# Patient Record
Sex: Male | Born: 1960
Health system: Southern US, Community
[De-identification: ages and names within clinical notes are randomized; demographics above are authoritative.]

## PROBLEM LIST (undated history)

## (undated) ENCOUNTER — Emergency Department (HOSPITAL_BASED_OUTPATIENT_CLINIC_OR_DEPARTMENT_OTHER): Admission: EM | Payer: 59 | Source: Home / Self Care

## (undated) ENCOUNTER — Emergency Department (HOSPITAL_COMMUNITY): Admission: EM

## (undated) DIAGNOSIS — Z9289 Personal history of other medical treatment: Secondary | ICD-10-CM

## (undated) DIAGNOSIS — I219 Acute myocardial infarction, unspecified: Secondary | ICD-10-CM

## (undated) DIAGNOSIS — E119 Type 2 diabetes mellitus without complications: Secondary | ICD-10-CM

## (undated) DIAGNOSIS — E785 Hyperlipidemia, unspecified: Secondary | ICD-10-CM

## (undated) DIAGNOSIS — I10 Essential (primary) hypertension: Secondary | ICD-10-CM

## (undated) DIAGNOSIS — M1990 Unspecified osteoarthritis, unspecified site: Secondary | ICD-10-CM

## (undated) DIAGNOSIS — H269 Unspecified cataract: Secondary | ICD-10-CM

## (undated) DIAGNOSIS — D649 Anemia, unspecified: Secondary | ICD-10-CM

## (undated) DIAGNOSIS — K219 Gastro-esophageal reflux disease without esophagitis: Secondary | ICD-10-CM

## (undated) DIAGNOSIS — H3320 Serous retinal detachment, unspecified eye: Secondary | ICD-10-CM

## (undated) DIAGNOSIS — R931 Abnormal findings on diagnostic imaging of heart and coronary circulation: Secondary | ICD-10-CM

## (undated) DIAGNOSIS — I2510 Atherosclerotic heart disease of native coronary artery without angina pectoris: Secondary | ICD-10-CM

## (undated) DIAGNOSIS — Z720 Tobacco use: Secondary | ICD-10-CM

## (undated) DIAGNOSIS — H35039 Hypertensive retinopathy, unspecified eye: Secondary | ICD-10-CM

## (undated) HISTORY — DX: Serous retinal detachment, unspecified eye: H33.20

## (undated) HISTORY — DX: Acute myocardial infarction, unspecified: I21.9

## (undated) HISTORY — DX: Unspecified cataract: H26.9

## (undated) HISTORY — DX: Hypertensive retinopathy, unspecified eye: H35.039

## (undated) HISTORY — DX: Type 2 diabetes mellitus without complications: E11.9

## (undated) HISTORY — DX: Personal history of other medical treatment: Z92.89

## (undated) HISTORY — PX: COLONOSCOPY: SHX174

## (undated) HISTORY — DX: Hyperlipidemia, unspecified: E78.5

## (undated) SURGERY — Surgical Case
Anesthesia: *Unknown

---

## 2008-12-17 ENCOUNTER — Ambulatory Visit (HOSPITAL_COMMUNITY): Admission: RE | Admit: 2008-12-17 | Discharge: 2008-12-17 | Payer: Self-pay | Admitting: Surgery

## 2008-12-17 ENCOUNTER — Encounter (INDEPENDENT_AMBULATORY_CARE_PROVIDER_SITE_OTHER): Payer: Self-pay | Admitting: Surgery

## 2010-04-04 HISTORY — PX: TUMOR REMOVAL: SHX12

## 2010-05-06 ENCOUNTER — Ambulatory Visit
Admission: RE | Admit: 2010-05-06 | Discharge: 2010-05-06 | Disposition: A | Discharge status: Home / Self Care | Payer: Commercial Managed Care - PPO | Source: Ambulatory Visit | Attending: Internal Medicine | Admitting: Internal Medicine

## 2010-05-06 ENCOUNTER — Other Ambulatory Visit: Payer: Self-pay | Admitting: Internal Medicine

## 2010-05-06 DIAGNOSIS — R0602 Shortness of breath: Secondary | ICD-10-CM

## 2010-07-09 LAB — BASIC METABOLIC PANEL
Calcium: 9.2 mg/dL (ref 8.4–10.5)
GFR calc Af Amer: >60 mL/min (ref >60)
GFR calc non Af Amer: >60 mL/min (ref >60)
Glucose, Bld: 126 mg/dL — ABNORMAL HIGH (ref 70–99)
Potassium: 3.9 mEq/L (ref 3.5–5.1)
Sodium: 140 mEq/L (ref 135–145)

## 2010-07-09 LAB — CBC
HCT: 40.1 % (ref 39.0–52.0)
Hemoglobin: 13 g/dL (ref 13.0–17.0)
RBC: 5.11 MIL/uL (ref 4.22–5.81)
RDW: 15.9 % — ABNORMAL HIGH (ref 11.5–15.5)
WBC: 6.5 10*3/uL (ref 4.0–10.5)

## 2010-07-09 LAB — DIFFERENTIAL
Basophils Absolute: 0 10*3/uL (ref 0.0–0.1)
Eosinophils Relative: 4 % (ref 0–5)
Lymphocytes Relative: 26 % (ref 12–46)
Lymphs Abs: 1.7 10*3/uL (ref 0.7–4.0)
Monocytes Absolute: 0.6 10*3/uL (ref 0.1–1.0)
Monocytes Relative: 10 % (ref 3–12)
Neutro Abs: 3.9 10*3/uL (ref 1.7–7.7)

## 2011-02-16 ENCOUNTER — Telehealth (HOSPITAL_COMMUNITY): Payer: Self-pay | Admitting: Emergency Medicine

## 2011-09-02 ENCOUNTER — Emergency Department (HOSPITAL_COMMUNITY): Payer: 59

## 2011-09-02 ENCOUNTER — Encounter (HOSPITAL_COMMUNITY): Payer: Self-pay | Admitting: Emergency Medicine

## 2011-09-02 ENCOUNTER — Inpatient Hospital Stay (HOSPITAL_COMMUNITY)
Admission: EM | Admit: 2011-09-02 | Discharge: 2011-09-03 | DRG: 249 | Disposition: A | Discharge status: Home / Self Care | Payer: 59 | Attending: Cardiology | Admitting: Cardiology

## 2011-09-02 ENCOUNTER — Encounter (HOSPITAL_COMMUNITY)
Admission: EM | Disposition: A | Discharge status: Home / Self Care | Payer: Self-pay | Source: Home / Self Care | Attending: Cardiology

## 2011-09-02 DIAGNOSIS — Z7982 Long term (current) use of aspirin: Secondary | ICD-10-CM

## 2011-09-02 DIAGNOSIS — I517 Cardiomegaly: Secondary | ICD-10-CM

## 2011-09-02 DIAGNOSIS — Z9861 Coronary angioplasty status: Secondary | ICD-10-CM

## 2011-09-02 DIAGNOSIS — I2510 Atherosclerotic heart disease of native coronary artery without angina pectoris: Secondary | ICD-10-CM

## 2011-09-02 DIAGNOSIS — I2119 ST elevation (STEMI) myocardial infarction involving other coronary artery of inferior wall: Principal | ICD-10-CM

## 2011-09-02 DIAGNOSIS — E785 Hyperlipidemia, unspecified: Secondary | ICD-10-CM

## 2011-09-02 DIAGNOSIS — D509 Iron deficiency anemia, unspecified: Secondary | ICD-10-CM

## 2011-09-02 DIAGNOSIS — Z720 Tobacco use: Secondary | ICD-10-CM

## 2011-09-02 DIAGNOSIS — F17200 Nicotine dependence, unspecified, uncomplicated: Secondary | ICD-10-CM | POA: Diagnosis present

## 2011-09-02 DIAGNOSIS — Z8249 Family history of ischemic heart disease and other diseases of the circulatory system: Secondary | ICD-10-CM

## 2011-09-02 DIAGNOSIS — K219 Gastro-esophageal reflux disease without esophagitis: Secondary | ICD-10-CM | POA: Diagnosis present

## 2011-09-02 DIAGNOSIS — Z833 Family history of diabetes mellitus: Secondary | ICD-10-CM

## 2011-09-02 HISTORY — DX: Tobacco use: Z72.0

## 2011-09-02 HISTORY — DX: Hyperlipidemia, unspecified: E78.5

## 2011-09-02 HISTORY — DX: Gastro-esophageal reflux disease without esophagitis: K21.9

## 2011-09-02 HISTORY — DX: Atherosclerotic heart disease of native coronary artery without angina pectoris: I25.10

## 2011-09-02 HISTORY — PX: LEFT HEART CATHETERIZATION WITH CORONARY ANGIOGRAM: SHX5451

## 2011-09-02 HISTORY — PX: PERCUTANEOUS CORONARY STENT INTERVENTION (PCI-S): SHX5485

## 2011-09-02 LAB — CBC
HCT: 39.3 % (ref 39.0–52.0)
Hemoglobin: 13.5 g/dL (ref 13.0–17.0)
Hemoglobin: 11.6 g/dL — ABNORMAL LOW (ref 13.0–17.0)
MCHC: 34.4 g/dL (ref 30.0–36.0)
MCV: 73.3 fL — ABNORMAL LOW (ref 78.0–100.0)
Platelets: 257 10*3/uL (ref 150–400)
RBC: 4.8 MIL/uL (ref 4.22–5.81)
RDW: 15.3 % (ref 11.5–15.5)
WBC: 8.3 10*3/uL (ref 4.0–10.5)

## 2011-09-02 LAB — CARDIAC PANEL(CRET KIN+CKTOT+MB+TROPI)
CK, MB: 56.2 ng/mL (ref 0.3–4.0)
CK, MB: 59.2 ng/mL (ref 0.3–4.0)
Relative Index: 7.4 — ABNORMAL HIGH (ref 0.0–2.5)
Total CK: 754 U/L — ABNORMAL HIGH (ref 7–232)
Total CK: 686 U/L — ABNORMAL HIGH (ref 7–232)
Troponin I: >25 ng/mL (ref <0.30)

## 2011-09-02 LAB — GLUCOSE, CAPILLARY: Glucose-Capillary: 193 mg/dL — ABNORMAL HIGH (ref 70–99)

## 2011-09-02 LAB — MRSA PCR SCREENING: MRSA by PCR: POSITIVE — AB

## 2011-09-02 LAB — POCT I-STAT, CHEM 8
Calcium, Ion: 1.16 mmol/L (ref 1.12–1.32)
Chloride: 105 mEq/L (ref 96–112)
Creatinine, Ser: 1.3 mg/dL (ref 0.50–1.35)
Glucose, Bld: 172 mg/dL — ABNORMAL HIGH (ref 70–99)
Potassium: 3.3 mEq/L — ABNORMAL LOW (ref 3.5–5.1)

## 2011-09-02 LAB — DIFFERENTIAL
Basophils Relative: 1 % (ref 0–1)
Eosinophils Relative: 2 % (ref 0–5)
Monocytes Absolute: 1 10*3/uL (ref 0.1–1.0)
Monocytes Relative: 9 % (ref 3–12)
Neutro Abs: 7.6 10*3/uL (ref 1.7–7.7)

## 2011-09-02 LAB — HEPATIC FUNCTION PANEL
Alkaline Phosphatase: 60 U/L (ref 39–117)
Total Protein: 6.1 g/dL (ref 6.0–8.3)

## 2011-09-02 LAB — BASIC METABOLIC PANEL
BUN: 18 mg/dL (ref 6–23)
CO2: 25 mEq/L (ref 19–32)
CO2: 21 mEq/L (ref 19–32)
Calcium: 9.5 mg/dL (ref 8.4–10.5)
Chloride: 98 mEq/L (ref 96–112)
Chloride: 104 mEq/L (ref 96–112)
Creatinine, Ser: 1.14 mg/dL (ref 0.50–1.35)
GFR calc Af Amer: 84 mL/min — ABNORMAL LOW (ref >90)
Glucose, Bld: 167 mg/dL — ABNORMAL HIGH (ref 70–99)
Potassium: 4.1 mEq/L (ref 3.5–5.1)
Sodium: 134 mEq/L — ABNORMAL LOW (ref 135–145)

## 2011-09-02 LAB — LIPID PANEL
LDL Cholesterol: 122 mg/dL — ABNORMAL HIGH (ref 0–99)
Triglycerides: 206 mg/dL — ABNORMAL HIGH (ref <150)
VLDL: 41 mg/dL — ABNORMAL HIGH (ref 0–40)

## 2011-09-02 LAB — POCT I-STAT TROPONIN I: Troponin i, poc: 0 ng/mL (ref 0.00–0.08)

## 2011-09-02 SURGERY — LEFT HEART CATHETERIZATION WITH CORONARY ANGIOGRAM
Anesthesia: LOCAL | Laterality: Right

## 2011-09-02 MED ORDER — ATORVASTATIN CALCIUM 80 MG PO TABS: 80.0000 mg | ORAL_TABLET | Freq: Every day | ORAL | Status: DC

## 2011-09-02 MED ORDER — HEPARIN SODIUM (PORCINE) 5000 UNIT/ML IJ SOLN
INTRAMUSCULAR | Status: AC
  Administered 2011-09-02: 4000 [IU]
  Filled 2011-09-02: qty 1

## 2011-09-02 MED ORDER — METOPROLOL TARTRATE 12.5 MG HALF TABLET
12.5000 mg | ORAL_TABLET | Freq: Two times a day (BID) | ORAL | Status: DC
  Administered 2011-09-02 – 2011-09-03 (×3): 12.5 mg via ORAL
  Filled 2011-09-02 (×4): qty 1

## 2011-09-02 MED ORDER — POTASSIUM CHLORIDE CRYS ER 20 MEQ PO TBCR
EXTENDED_RELEASE_TABLET | ORAL | Status: AC
  Filled 2011-09-02: qty 1

## 2011-09-02 MED ORDER — MORPHINE SULFATE 2 MG/ML IJ SOLN: 2.0000 mg | INTRAMUSCULAR | Status: DC | PRN

## 2011-09-02 MED ORDER — ALUM & MAG HYDROXIDE-SIMETH 200-200-20 MG/5ML PO SUSP: 15.0000 mL | ORAL | Status: DC | PRN

## 2011-09-02 MED ORDER — MIDAZOLAM HCL 2 MG/2ML IJ SOLN
INTRAMUSCULAR | Status: AC
  Filled 2011-09-02: qty 2

## 2011-09-02 MED ORDER — NITROGLYCERIN 0.4 MG SL SUBL
0.4000 mg | SUBLINGUAL_TABLET | SUBLINGUAL | Status: DC | PRN
  Administered 2011-09-02 (×2): 0.4 mg via SUBLINGUAL

## 2011-09-02 MED ORDER — ASPIRIN 81 MG PO TBEC: 81.0000 mg | DELAYED_RELEASE_TABLET | Freq: Every day | ORAL | Status: DC

## 2011-09-02 MED ORDER — BIVALIRUDIN 250 MG IV SOLR
INTRAVENOUS | Status: AC
  Filled 2011-09-02: qty 250

## 2011-09-02 MED ORDER — NICARDIPINE HCL 2.5 MG/ML IV SOLN
INTRAVENOUS | Status: AC
  Filled 2011-09-02: qty 10

## 2011-09-02 MED ORDER — ONDANSETRON HCL 4 MG/2ML IJ SOLN: 4.0000 mg | Freq: Four times a day (QID) | INTRAMUSCULAR | Status: DC | PRN

## 2011-09-02 MED ORDER — CHLORHEXIDINE GLUCONATE CLOTH 2 % EX PADS
6.0000 | MEDICATED_PAD | Freq: Every day | CUTANEOUS | Status: DC
  Administered 2011-09-03: 6 via TOPICAL

## 2011-09-02 MED ORDER — ENOXAPARIN SODIUM 40 MG/0.4ML ~~LOC~~ SOLN
40.0000 mg | SUBCUTANEOUS | Status: DC
  Filled 2011-09-02: qty 0.4

## 2011-09-02 MED ORDER — NITROGLYCERIN 0.4 MG SL SUBL: 0.4000 mg | SUBLINGUAL_TABLET | SUBLINGUAL | Status: DC | PRN

## 2011-09-02 MED ORDER — ASPIRIN 81 MG PO CHEW
CHEWABLE_TABLET | ORAL | Status: AC
  Filled 2011-09-02: qty 4

## 2011-09-02 MED ORDER — HEPARIN SODIUM (PORCINE) 1000 UNIT/ML IJ SOLN
4000.0000 [IU] | Freq: Once | INTRAMUSCULAR | Status: DC
  Filled 2011-09-02: qty 4

## 2011-09-02 MED ORDER — SODIUM CHLORIDE 0.9 % IV SOLN
INTRAVENOUS | Status: DC
  Administered 2011-09-02: 150 mL/h via INTRAVENOUS

## 2011-09-02 MED ORDER — PRASUGREL HCL 10 MG PO TABS
10.0000 mg | ORAL_TABLET | Freq: Every day | ORAL | Status: DC
  Administered 2011-09-02 – 2011-09-03 (×2): 10 mg via ORAL
  Filled 2011-09-02 (×2): qty 1

## 2011-09-02 MED ORDER — ALPRAZOLAM 0.5 MG PO TABS: 0.5000 mg | ORAL_TABLET | Freq: Three times a day (TID) | ORAL | Status: DC | PRN

## 2011-09-02 MED ORDER — PNEUMOCOCCAL VAC POLYVALENT 25 MCG/0.5ML IJ INJ
0.5000 mL | INJECTION | INTRAMUSCULAR | Status: AC
  Administered 2011-09-03: 0.5 mL via INTRAMUSCULAR
  Filled 2011-09-02: qty 0.5

## 2011-09-02 MED ORDER — ONDANSETRON HCL 4 MG/2ML IJ SOLN
4.0000 mg | Freq: Once | INTRAMUSCULAR | Status: AC
  Administered 2011-09-02: 4 mg via INTRAVENOUS

## 2011-09-02 MED ORDER — MORPHINE SULFATE 4 MG/ML IJ SOLN
INTRAMUSCULAR | Status: AC
  Filled 2011-09-02: qty 1

## 2011-09-02 MED ORDER — METOPROLOL TARTRATE 1 MG/ML IV SOLN
INTRAVENOUS | Status: AC
  Filled 2011-09-02: qty 5

## 2011-09-02 MED ORDER — HEPARIN (PORCINE) IN NACL 2-0.9 UNIT/ML-% IJ SOLN
INTRAMUSCULAR | Status: AC
  Filled 2011-09-02: qty 2000

## 2011-09-02 MED ORDER — METOPROLOL TARTRATE 25 MG PO TABS: 12.5000 mg | ORAL_TABLET | Freq: Two times a day (BID) | ORAL | Status: DC

## 2011-09-02 MED ORDER — OXYCODONE-ACETAMINOPHEN 5-325 MG PO TABS: 1.0000 | ORAL_TABLET | ORAL | Status: DC | PRN

## 2011-09-02 MED ORDER — MORPHINE SULFATE 4 MG/ML IJ SOLN
4.0000 mg | Freq: Once | INTRAMUSCULAR | Status: AC
  Administered 2011-09-02: 4 mg via INTRAVENOUS

## 2011-09-02 MED ORDER — METOPROLOL TARTRATE 1 MG/ML IV SOLN
5.0000 mg | Freq: Once | INTRAVENOUS | Status: AC
  Administered 2011-09-02: 5 mg via INTRAVENOUS

## 2011-09-02 MED ORDER — LISINOPRIL 5 MG PO TABS
5.0000 mg | ORAL_TABLET | Freq: Every day | ORAL | Status: DC
  Administered 2011-09-02 – 2011-09-03 (×2): 5 mg via ORAL
  Filled 2011-09-02 (×3): qty 1

## 2011-09-02 MED ORDER — ASPIRIN 81 MG PO CHEW: 324.0000 mg | CHEWABLE_TABLET | Freq: Once | ORAL | Status: DC

## 2011-09-02 MED ORDER — GUAIFENESIN-DM 100-10 MG/5ML PO SYRP: 15.0000 mL | ORAL_SOLUTION | ORAL | Status: DC | PRN

## 2011-09-02 MED ORDER — ACETAMINOPHEN 325 MG PO TABS: 650.0000 mg | ORAL_TABLET | ORAL | Status: DC | PRN

## 2011-09-02 MED ORDER — MUPIROCIN 2 % EX OINT
TOPICAL_OINTMENT | Freq: Two times a day (BID) | CUTANEOUS | Status: DC
  Administered 2011-09-02: 22:00:00 via NASAL
  Filled 2011-09-02 (×2): qty 22

## 2011-09-02 MED ORDER — FENTANYL CITRATE 0.05 MG/ML IJ SOLN
INTRAMUSCULAR | Status: AC
  Filled 2011-09-02: qty 2

## 2011-09-02 MED ORDER — NICOTINE 14 MG/24HR TD PT24
14.0000 mg | MEDICATED_PATCH | Freq: Every day | TRANSDERMAL | Status: DC
  Administered 2011-09-03: 14 mg via TRANSDERMAL
  Filled 2011-09-02: qty 1

## 2011-09-02 MED ORDER — MORPHINE SULFATE 2 MG/ML IJ SOLN
2.0000 mg | Freq: Once | INTRAMUSCULAR | Status: AC
  Administered 2011-09-02: 2 mg via INTRAVENOUS

## 2011-09-02 MED ORDER — ATORVASTATIN CALCIUM 80 MG PO TABS
80.0000 mg | ORAL_TABLET | Freq: Every day | ORAL | Status: DC
  Administered 2011-09-02: 80 mg via ORAL
  Filled 2011-09-02 (×3): qty 1

## 2011-09-02 MED ORDER — LIDOCAINE HCL (PF) 1 % IJ SOLN
INTRAMUSCULAR | Status: AC
  Filled 2011-09-02: qty 30

## 2011-09-02 MED ORDER — ASPIRIN 81 MG PO CHEW
81.0000 mg | CHEWABLE_TABLET | Freq: Every day | ORAL | Status: DC
  Administered 2011-09-02: 81 mg via ORAL
  Filled 2011-09-02 (×3): qty 1

## 2011-09-02 MED ORDER — MORPHINE SULFATE 2 MG/ML IJ SOLN
INTRAMUSCULAR | Status: AC
  Filled 2011-09-02: qty 1

## 2011-09-02 MED ORDER — HEPARIN BOLUS VIA INFUSION: 4000.0000 [IU] | Freq: Once | INTRAVENOUS | Status: DC

## 2011-09-02 MED ORDER — NITROGLYCERIN 0.2 MG/ML ON CALL CATH LAB
INTRAVENOUS | Status: AC
  Filled 2011-09-02: qty 1

## 2011-09-02 MED ORDER — PRASUGREL HCL 10 MG PO TABS
ORAL_TABLET | ORAL | Status: AC
  Filled 2011-09-02: qty 6

## 2011-09-02 MED ORDER — PRASUGREL HCL 10 MG PO TABS: 10.0000 mg | ORAL_TABLET | Freq: Every day | ORAL | Status: DC

## 2011-09-02 MED ORDER — LISINOPRIL 5 MG PO TABS: 5.0000 mg | ORAL_TABLET | Freq: Every day | ORAL | Status: DC

## 2011-09-02 NOTE — ED Notes (Signed)
7/10 no changes, 4th sl ntg given, card MD at [facility], morphine 2 mg given. Going to cath lab now.

## 2011-09-02 NOTE — ED Notes (Signed)
Chem 8 and Troponin results given to Dr. Theodoro Kalata by B. Bing Plume, EMT

## 2011-09-02 NOTE — ED Notes (Signed)
Card MD and EDP at [facility], alert, interactive, pain increasing, 3rd sl ntg given.

## 2011-09-02 NOTE — Progress Notes (Signed)
Patient Derrick Todd, 51 year old African American male had stemi procedure early this morning after suffering myocardial infarction.  Patient is in good spirits and feels happy with his progress.  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  He expressed appreciation for the Chaplains' (4) provision of pastoral presence, prayer, and conversation.  For follow-up, we will refer to the unit Chaplain for 2900.

## 2011-09-02 NOTE — Progress Notes (Signed)
Patient Name: Derrick Todd Date of Encounter: 09/02/2011  Active Problems:  ST elevation myocardial infarction (STEMI) of inferior wall    SUBJECTIVE: No chest pain overnight, no SOB, very concerned about returning to work. Will quit smoking.  OBJECTIVE Filed Vitals:   09/02/11 0530 09/02/11 0545 09/02/11 0600 09/02/11 0645  BP: 111/70 106/67 115/82 114/68  Pulse: 72 72 73 71  Temp:      TempSrc:      Resp: 14 14 15 16   Height:      Weight:      SpO2: 100% 100% 99% 100%    Intake/Output Summary (Last 24 hours) at 09/02/11 0806 Last data filed at 09/02/11 0600  Gross per 24 hour  Intake    300 ml  Output      0 ml  Net    300 ml   Weight change:  Filed Weights   09/02/11 0214  Weight: 225 lb (102.059 kg)     PHYSICAL EXAM General: Well developed, well nourished, male in no acute distress. Head: Normocephalic, atraumatic.  Neck: Supple without bruits, JVD not elevated. Lungs:  Resp regular and unlabored, CTA bilaterally. Heart: RRR, S1, S2, no S3, S4, or murmur. Abdomen: Soft, non-tender, non-distended, BS + x 4.  Extremities: No clubbing, cyanosis, no edema. Sheath being removed.  Neuro: Alert and oriented X 3. Moves all extremities spontaneously. Psych: Normal affect.  LABS: CBC: Basename 09/02/11 0654 09/02/11 0159 09/02/11 0150  WBC 8.3 -- 11.1*  NEUTROABS -- -- 7.6  HGB 11.6* 14.6 --  HCT 35.2* 43.0 --  MCV 73.3* -- 73.3*  PLT 257 -- 295   INR: Basename 09/02/11 0150  INR 0.97   Basic Metabolic Panel: Basename 09/02/11 0654 09/02/11 0159 09/02/11 0150  NA 134* 139 --  K 4.1 3.3* --  CL 104 105 --  CO2 21 -- 25  GLUCOSE 167* 172* --  BUN 15 19 --  CREATININE 0.84 1.30 --  CALCIUM 8.6 -- 9.5  MG -- -- --  PHOS -- -- --   Cardiac Enzymes: Basename 09/02/11 0654  CKTOTAL 754*  CKMB 56.2*  CKMBINDEX --  TROPONINI >25.00*    Basename 09/02/11 0157  TROPIPOC 0.00    TELE:   SR  ECG: SR, evolving inferior MI  changes  Radiology/Studies: Dg Chest Portable 1 View 09/02/2011  *RADIOLOGY REPORT*  Clinical Data: Chest pain.  History of gastroesophageal reflux disease.  PORTABLE CHEST - 1 VIEW  Comparison: 05/06/2010  Findings: Shallow inspiration.  Normal heart size and pulmonary vascularity.  No focal airspace consolidation in the lungs.  No blunting of costophrenic angles.  No pneumothorax.  Small apical bulla on the right.  No significant change since previous study.  IMPRESSION: No evidence of active pulmonary disease.  Original Report Authenticated By: Marlon Pel, M.D.    Current Medications:  . aspirin  324 mg Oral Once  . aspirin  81 mg Oral Daily  . atorvastatin  80 mg Oral q1800  . bivalirudin      . bivalirudin      . enoxaparin  40 mg Subcutaneous Q24H  . fentaNYL      . heparin      . heparin      . lidocaine      . metoprolol  5 mg Intravenous Once  . metoprolol tartrate  12.5 mg Oral BID  . midazolam      . midazolam      .  morphine injection  2 mg Intravenous Once  . morphine      .  morphine injection  4 mg Intravenous Once  . nicotine  14 mg Transdermal Daily  . nitroGLYCERIN      . ondansetron (ZOFRAN) IV  4 mg Intravenous Once  . pneumococcal 23 valent vaccine  0.5 mL Intramuscular Tomorrow-1000  . prasugrel      . prasugrel  10 mg Oral Daily   . sodium chloride 150 mL/hr (09/02/11 0520)    ASSESSMENT AND PLAN: Active Problems:  ST elevation myocardial infarction (STEMI) of inferior wall - s/p 2.0 Minivision by 18mm stent to the RCA. Residual disease in the LAD, CFX - medical Rx. Cardiac rehab to see. Ck echo  Lipids - ck profile, statin added  Tobacco - cessation consult  Signed, Theodore Demark , PA-C 8:06 AM 09/02/2011  Patient seen with PA, agree with the above note.  No chest pain this morning.  He may transfer out to the floor.  Echo today, add ACEI.  Likely home tomorrow.  Needs to stop smoking.  I'll see back in the office in 2 wks.   Marca Ancona 09/02/2011 8:41 AM

## 2011-09-02 NOTE — ED Notes (Signed)
Pt arrives ambulatory with wife.  Brought pt to EKG room.  EKG done and pt moved to room 4 on the chair from EKG room.  Pt is alert and oriented

## 2011-09-02 NOTE — Interval H&P Note (Signed)
History and Physical Interval Note:  09/02/2011 4:47 AM  Derrick Todd  has presented today for surgery, with the diagnosis of STEMI  The various methods of treatment have been discussed with the patient and family. After consideration of risks, benefits and other options for treatment, the patient has consented to  Procedure(s) (LRB): LEFT HEART CATHETERIZATION WITH CORONARY ANGIOGRAM ([name]) PERCUTANEOUS CORONARY STENT INTERVENTION (PCI-S) (Right) as a surgical intervention .  The patients' history has been reviewed, patient examined, no change in status, stable for surgery.  I have reviewed the patients' chart and labs.  Questions were answered to the patient's satisfaction.     [name]  Dr. Shirlee Latch and I saw him in the ER with STEMI.  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  Immediate cath with possible PCI was recommended.  He was agreeable to proceed.

## 2011-09-02 NOTE — Consult Note (Signed)
Pt smokes 1 ppd of cigarettes. He is in action stage and says he is planning to quit cold [place].he's quit once before for 6 months on his own.  He also says his MD is planning to put him on wellbutrin. Encouraged pt to quit completely. Referred to 1-800 quit now for f/u and support. Discussed oral fixation substitutes, second hand smoke and in home smoking policy. Reviewed and gave pt Written education/contact information.

## 2011-09-02 NOTE — CV Procedure (Signed)
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  He presents with severe chest pain, and a diagnostic ECG for inferior MI.  Immediate cardiac cath was recommended.   Diagnostic Procedure Details:  The right radial artery and groin were prepped in the usual fashion.  Time out was performed.  Immediate access was obtained in the right radial and 1.25mg  of IA nicardipine was given.  UFH had been given in the ER and an additional 1k bolus was given as well.  We were able to access the central aorta but despite multiple maneuvers, the catheter and wire continued to select the descending thoracic aorta.  Because of time constraints for reperfusion therapy, this sight was abandoned for the femoral approach.   A 52F sheath was introduced into the right femoral artery.  Bivalirudin was then given and ACT checked for adequate anticoagulation.   Standard Judkins catheters were used for selective coronary angiography.  Catheter exchanges were performed over a wire.  The diagnostic procedure was well-tolerated without immediate complications.  PCI Procedure Note:  Following the diagnostic procedure, the decision was made to proceed with PCI.   Once a therapeutic ACT was achieved, a 6 [place] JR4 [facility] guide catheter was inserted.  A Traverse coronary guidewire was used to cross the lesion.  The lesion was predilated with a 1.41mm  Balloon with multiple inflations.  We then upsized to a 2.0 balloon.  The vessel did not appear any larger than 2.0 distally at maximum. We gave IC NTG to try to dilate the distal vessel, but despite this there was not any appreciable expansion.   The lesion was then stented with a  2.0 by 18 Abbott minivision non DES stent to 11 atm, and then we pulled back the balloon slightly and deployed at 13  atm to avoid distal vessel trauma.   We did not feel that DES could be safely deployed with certainty because of size.   The stent was postdilated with a 2.0  noncompliant balloon.    There was telescoping of the balloon distally from the stent so we had to be careful regarding high pressure dilatation.  We did multiple inflations throughout at 14 atmospheres.    Following PCI, there was 0% residual stenosis and TIMI-3 flow, although there was still some tapering of the stent distally into the 2.42mm artery despite high pressure inflation.   Final angiography confirmed a satisfactory  result. .  The patient tolerated the PCI procedure well. There were no immediate procedural complications.  A TR band was placed on the Right wrist, and femoral sheath sewn into place.   The patient was transferred to the coronary unitfor further monitoring.   PROCEDURAL FINDINGS Hemodynamics: AO 112/73 (92) LV 134/13 No gradient on pullback.    Coronary angiography: Coronary dominance: right  Left mainstem: Normal  Left anterior descending (LAD): Courses to the apex and supplies the apex and distal inferior wall as well.  The RCA distribution is quite small.  The vessel is large in caliber.  There is prox mid 20-305 eccentric plaque, then there is a focal 70% mid stenosis. The distal vessel is large and without significant disease.   Left circumflex (LCx): Supplies a tiny intermediate, then a large OM that trifurcates distally.  The superior trifurcation vessel is modest in size and has about 70%  segmental narrowing proximally.  The two inferior branches have no focal narrowing.    Right coronary artery (RCA): The RCA is large in caliber in the proximal and mid vessel with a proximal stenosis of 20% and  mid stenosis of 40%.  There is an AM branch with 50-60% narrowing ostially.    Distally there is a 70% narrowing followed by total occlusion. The distal vessel consists of three very small caliber branches of <2.0 mm  in size. After deployment of the minivision stent, the residual areas were widely patent with about 10% residual.  There remained some mildly reduced distal stent expansion despite high pressure inflation, and some residual disease at the proximal end of the stent.  There was excellent TIMI 3 flow.  There was mild plaquing of the distal PLA branches that were 1.69mm in size at maximum.      Left ventriculography: Left ventricular systolic function is low normal, LVEF is estimated at 55%, there is no significant mitral regurgitation    PCI Data: Vessel - RCA/Segment - 3 Percent Stenosis (pre)  100 TIMI-flow 0 Stent 2.0 Minivision by 18mm Percent Stenosis (post) 10 TIMI-flow (post) 3  Final Conclusions:   1.  Acute inferior MI secondary to thrombotic occlusion of the distal RCA 2.  Successful reperfusion of small caliber artery with deployment of a non DES  (due to vessel size) 3.  Residual disease of the LAD and OM1 superior subbranch as noted.  Recommendations:  1.  DAPT times one year 2.  Smoking cessation counseling 3.  Cardiac rehab phase I and II 4.  Aggressive use of statin therapy if tolerated.  5  Low dose beta blockade with titration as tolerated.    Films reviewed with patient's spouse.    [name] 09/02/2011, 4:58 AM

## 2011-09-02 NOTE — Progress Notes (Addendum)
Called by Case Mgr to try to get prescriptions sent in to [facility] today because they will be closed for the weekend. I tentatively prepped discharge, but I sent weekend PA a message regarding a few things that may need to be edited before signing off:  - Co-Signing MD will need to be changed based on who rounds tomorrow - Hgb will need to be trended given mild anemia/microcytosis - Will need to click Refresh on D/C Summary to update vitals/labs  The family is aware that meds may change between today and tomorrow, so med rec will need to be updated if any changes are made. D/C Summary can be found as a Shared Note under incomplete section.  Kaarin Pardy PA-C

## 2011-09-02 NOTE — Progress Notes (Signed)
  Echocardiogram 2D Echocardiogram has been performed.  Derrick Todd 09/02/2011, 10:10 AM

## 2011-09-02 NOTE — Care Management Note (Addendum)
    Page 1 of 1   09/02/2011     3:37:09 PM   CARE MANAGEMENT NOTE 09/02/2011  Patient:  Derrick Todd,Derrick Todd   Account Number:  [id number]  Date Initiated:  09/02/2011  Documentation initiated by:  Junius Creamer  Subjective/Objective Assessment:   adm w mi     Action/Plan:   lives w wife   Anticipated DC Date:  09/04/2011   Anticipated DC Plan:  HOME/SELF CARE      DC Planning Services  CM consult      Choice offered to / List presented to:             Status of service:   Medicare Important Message given?   (If response is "NO", the following Medicare IM given date fields will be blank) Date Medicare IM given:   Date Additional Medicare IM given:    Discharge Disposition:  HOME/SELF CARE  Per UR Regulation:  Reviewed for med. necessity/level of care/duration of stay  If discussed at Long Length of Stay Meetings, dates discussed:    Comments:  09-02-11 3pm Avie Arenas, RNBSN [phone] CO-PAY AMOUNT FOR EFFIENT 10MG  PO FOR A 30 DAY SUPPLY IS:$25.00 IN-NET WORK PHARMACIES ARE:  [facility], NO PRECERT REQUIRED.  Called Outpt pharmacy as may be discharged over w/e and OP pharmacies are closed.  Are in contract with [facility] pharmacy [place] to provide scripts over w/e.  Spoke with patient - wife in room.  Any meds will be new to him. only takes a PPI as needed OTC.  Wife wants to pick up from our OP pharmacy here at [place]. Paged [place] service to get prescriptions. Annabelle Harman PA here now and doing all prescriptions and sending electonically to [facility] OP pharmacy on [address].  Wife and patient updated.  Wife will go over at 4:30-5pm to pick up.  Aware that prescriptions may change if physician deems changes prior to tomorrow.  Patient and wife understand and still want to pick up prescriptions today.  5/31 9:16a debbie dowell rn,bsn [phone] having cm sec ck on copay for effient.

## 2011-09-02 NOTE — Progress Notes (Signed)
CHAPLAIN NOTE: Responded to Code STEMI page. Spoke with nurses for background. Ministry of presence and comfort with staff Sermeno is RN in ED) and pt wife Rhunette Croft). Helped wife get situated in consult room near Cath Lab. Followed up with pt wife about 50 min late. She's not heard anything yet. Checked on progress in Cath Lab and gave her an update. Probably be about 1 hr before pt gets into a room in 2900.

## 2011-09-02 NOTE — H&P (Signed)
   Physician History and Physical    Derrick Todd MRN: [id number] DOB/AGE: April 18, 1960 51 y.o. Admit date: 09/02/2011  Primary Care Physician: Della Goo Primary Cardiologist: New  HPI:  51 yo with history of GERD and smoking developed severe substernal chest tightness radiating to his neck about 1.5 hours prior to arrival in ER.  He worked in the yard most of the day today with no problem.  Tonight, he was lying in bed when he developed the chest pain.  He has not had chest pain before.  He does not have a history of exertional dyspnea.  Prior to this evening, he has not had any significant medical problem other than occasional GERD for which he takes occasional Prilosec.  He has smoked 1 ppd for at least 20 years.  He does not take aspirin.  ECG in ER showed inferior ST elevation.  Patient was mildly tachycardic and mildly hypertensive.   Review of systems complete and found to be negative unless listed above   No family history on file.  History   Social History  . Marital Status: Married    Spouse Name: [name]    Number of Children: 2  . Years of Education: [name]   Occupational History  . ER nurse at [employer]   Social History Main Topics  . Smoking status: Current Everyday Smoker, 1 ppd  . Smokeless tobacco: Not on file  . Alcohol Use: No  . Drug Use: No  . Sexually Active:    Other Topics Concern  . Not on file   Social History Narrative  . No narrative on file    Family History:  HTN, DM.  No known CAD  Physical Exam: Blood pressure 144/86, pulse 91, temperature 98.3 F (36.8 C), temperature source Oral, resp. rate 12, height 6\' 2"  (1.88 m), weight 102.059 kg (225 lb), SpO2 99.00%.  General: NAD Neck: No JVD, no thyromegaly or thyroid nodule.  Lungs: Clear to auscultation bilaterally with normal respiratory effort. CV: Nondisplaced PMI.  Heart regular S1/S2, +S4, no murmur.  No peripheral edema.  No carotid bruit.  Normal pedal pulses.  Abdomen: Soft,  nontender, no hepatosplenomegaly, no distention.  Skin: Intact without lesions or rashes.  Neurologic: Alert and oriented x 3.  Psych: Normal affect. Extremities: No clubbing or cyanosis.  HEENT: Normal.   Labs:   Lab Results  Component Value Date   WBC 6.5 12/12/2008   HGB 14.6 09/02/2011   HCT 43.0 09/02/2011   MCV 78.4 12/12/2008   PLT 282 12/12/2008    Lab 09/02/11 0159  NA 139  K 3.3*  CL 105  CO2 --  BUN 19  CREATININE 1.30  CALCIUM --  PROT --  BILITOT --  ALKPHOS --  ALT --  AST --  GLUCOSE 172*    EKG: NSR, acute inferior MI  ASSESSMENT AND PLAN:  51 yo with history of smoking presents with inferior STEMI.  He will be transported emergently to the cath lab for angiography/possible PCI.  He will need to stop smoking. Further plans based on findings in the cath lab.   Signed: Marca Ancona 09/02/2011, 2:27 AM

## 2011-09-02 NOTE — Progress Notes (Signed)
62fr sheath was removed from patient's right femoral artery using manual pressure.  Pressure held for 20 minutes with no complications.  Groin site level 0. No oozing or hematoma noted.  Distal DP palpable. Patient toleratede well and post instructions given.  Vital signs; BP 108/70, sats; 100%, hr 69.

## 2011-09-02 NOTE — ED Notes (Signed)
PT. REPORTS SUDDEN MID CHEST PAIN RADIATING TO BOTH ARMS THIS MORNING WITH SLIGHT SOB , " SHARP/ PRESSURE", PT. TOOK 4 BABY ASA PTA.

## 2011-09-02 NOTE — Discharge Summary (Signed)
Discharge Summary   Patient ID: Derrick Todd MRN: [id number], DOB/AGE: [birth year] 51 y.o. Admit date: 09/02/2011 D/C date:     09/03/2011   Primary Discharge Diagnoses:  1. Newly diagnosed CAD with inferior STEMI secondary to thrombotic occlusion of the distal RCA s/p non-DES (due to vessel size) - residual LAD, OM dz for med rx - normal EF 55-60% 2. Tobacco use 3. Dyslipidemia - given initiation of statin, will need f/u LFTs/lipids in 6 weeks 4.  GERD 5. Mild anemia (microcytic)  Hospital Course: 51 y/o M with no prior cardiac hx of GERD and smoking presented to [facility] with complaints of severe substernal chest tightness radiating to his neck about 1.5 hours prior to arrival. EKG in the ER demonstrated inferior ST elevation. He was mildly tachycardic and mildly hypertensive. He was taken emergently to the cath lab by Dr. Riley Kill which demonstrated: 1. Acute inferior MI secondary to thrombotic occlusion of the distal RCA  2. Successful reperfusion of small caliber artery with deployment of a non DES (due to vessel size)  3. Residual disease of the LAD and OM1 superior subbranch as noted. EF was 55% without significant MR. Dr. Riley Kill recommended DAPT x 1 year, smoking cessation counseling, cardiac rehab, statin initation, and low-dose beta blockade titration. Troponins returned positive >25. ACEI was able to be added. 2D Echo was obtained demonstrating normal EF 55-60%, mild LVH, and hypokinesis of basal inferior myocardium. His lipid panel demonstrated dyslipidemia. The patient's activity has progressed with cardiac rehab. He is doing well today without complaints. Dr. Diona Browner has seen and examined him and feels he is stable for discharge.   Of note, his Hgb was mildly decreased, but stable and he was instructed to f/u PCP for evaluation of his mild anemia.  Discharge Vitals: Blood pressure 109/67, pulse 70, temperature 98.8 F (37.1 C), temperature source Oral, resp. rate  18, height 6\' 2"  (1.88 m), weight 102.059 kg (225 lb), SpO2 98.00%.  Labs: Lab Results  Component Value Date   WBC 7.2 09/03/2011   HGB 11.5* 09/03/2011   HCT 35.0* 09/03/2011   MCV 74.2* 09/03/2011   PLT 246 09/03/2011     Lab 09/03/11 0625  NA 136  K 3.8  CL 100  CO2 24  BUN 9  CREATININE 0.87  CALCIUM 8.7  PROT 5.9*  BILITOT 0.6  ALKPHOS 59  ALT 26  AST 34  GLUCOSE 246*    Basename 09/02/11 2106 09/02/11 1230 09/02/11 0654  CKTOTAL 510* 686* 754*  CKMB 37.8* 59.2* 56.2*  TROPONINI 13.29* 22.78* >25.00*   Lab Results  Component Value Date   CHOL 195 09/02/2011   HDL 32* 09/02/2011   LDLCALC 122* 09/02/2011   TRIG 206* 09/02/2011   Diagnostic Studies/Procedures   1. Chest Portable 1 View 09/02/2011  *RADIOLOGY REPORT*  Clinical Data: Chest pain.  History of gastroesophageal reflux disease.  PORTABLE CHEST - 1 VIEW  Comparison: 05/06/2010  Findings: Shallow inspiration.  Normal heart size and pulmonary vascularity.  No focal airspace consolidation in the lungs.  No blunting of costophrenic angles.  No pneumothorax.  Small apical bulla on the right.  No significant change since previous study.  IMPRESSION: No evidence of active pulmonary disease.  Original Report Authenticated By: Marlon Pel, M.D.   2. 2D Echo 09/02/11 Study Conclusions - Left ventricle: The cavity size was normal. Wall thickness was increased in a pattern of mild LVH. Systolic function was normal. The estimated ejection fraction was in the  range of 55% to 60%. There is hypokinesis of the basalinferior myocardium. - Left atrium: The atrium was mildly dilated.  3. Cardiac catheterization this admission, please see full report and above for summary.  Discharge Medications   Medication List  As of 09/03/2011 10:54 AM   STOP taking these medications         aspirin 81 MG chewable tablet         TAKE these medications         aspirin 81 MG EC tablet   Take 1 tablet (81 mg total) by mouth daily.       atorvastatin 80 MG tablet   Commonly known as: LIPITOR   Take 1 tablet (80 mg total) by mouth at bedtime.      lisinopril 5 MG tablet   Commonly known as: PRINIVIL,ZESTRIL   Take 1 tablet (5 mg total) by mouth daily.      metoprolol tartrate 25 MG tablet   Commonly known as: LOPRESSOR   Take 0.5 tablets (12.5 mg total) by mouth 2 (two) times daily.      nitroGLYCERIN 0.4 MG SL tablet   Commonly known as: NITROSTAT   Place 1 tablet (0.4 mg total) under the tongue every 5 (five) minutes x 3 doses as needed for chest pain.      prasugrel 10 MG Tabs   Commonly known as: EFFIENT   Take 1 tablet (10 mg total) by mouth daily.          Please note that although the meds above say to "stop chewable aspirin", this was only the 1-time dose that he got via EMS. The patient's AVS reflects him continuing regular baby aspirin.  Disposition   The patient will be discharged in stable condition to home. Discharge Orders    Future Appointments: Provider: Department: Dept Phone: Center:   09/12/2011 3:00 PM Ok Anis, NP [facility] [phone] [id number]     Future Orders Please Complete By Expires   Discharge instructions      Comments:   No driving for 2 weeks. No lifting over 10 lbs for 4 weeks. No sexual activity for 4 weeks. You may not return to work until cleared by your cardiologist. Keep procedure site clean & dry. If you notice increased pain, swelling, bleeding or pus, call/return!  You may shower, but no soaking baths/hot tubs/pools for 1 week.   Stop smoking!!     Follow-up Information    Follow up with Nicolasa Ducking, NP. (09/12/11 at 3pm)    Contact information:   [address] [place] [address] [phone]       Follow up with Primary Care Doctor. (You were mildly anemic this admission - please follow up with your primary doctor to evaluate)            Duration of Discharge Encounter: Greater than 30 minutes  including physician and PA time.  Signed, Shaune Spittle, Jessyca Sloan PA-C 09/03/2011, 10:54 AM

## 2011-09-02 NOTE — Progress Notes (Signed)
CARDIAC REHAB PHASE I   PRE:  Rate/Rhythm: 73 SR    BP: sitting 106/62    SaO2:   MODE:  Ambulation: 550 ft   POST:  Rate/Rhythm: 78    BP: sitting 120/68     SaO2:   Tolerated well. Quick pace. VSS Ed completed with wife present. Pt reluctant to do CRPII but agreed to referral being sent. Does not formally exercise or watch diet. Shocked by his event. Thought he was healthy. Sts he will quit smoking.  [id number]   Harriet Masson CES, ACSM

## 2011-09-02 NOTE — ED Provider Notes (Signed)
History     CSN: [id number]  Arrival date & time 09/02/11  0144   First MD Initiated Contact with Patient 09/02/11 0149      Chief Complaint  Patient presents with  . Chest Pain    (Consider location/radiation/quality/duration/timing/severity/associated sxs/prior treatment) HPI Provided by patient. Substernal chest pain started about 45 minutes prior to arrival. Sudden onset pressure like something grinding in his chest 8-9/10 in severity. Pain radiates to both arms. No nausea. Mild shortness of breath. Has history of GERD. Is a smoker. No history of hypertension, diabetes, hyperlipidemia. No family history of coronary disease at early age. No known aggravating or alleviating factors. No fevers chills. No cough. No leg pain or swelling. No recent illness. No history of same. History reviewed. No pertinent past medical history.  History reviewed. No pertinent past surgical history.  No family history on file.  History  Substance Use Topics  . Smoking status: Current Everyday Smoker  . Smokeless tobacco: Not on file  . Alcohol Use: No      Review of Systems  Constitutional: Negative for fever and chills.  HENT: Negative for neck pain and neck stiffness.   Eyes: Negative for pain.  Respiratory: Negative for cough.   Cardiovascular: Positive for chest pain. Negative for leg swelling.  Gastrointestinal: Negative for abdominal pain.  Genitourinary: Negative for dysuria.  Musculoskeletal: Negative for back pain.  Skin: Negative for rash.  Neurological: Negative for headaches.  All other systems reviewed and are negative.    Allergies  Review of patient's allergies indicates no known allergies.  Home Medications   Current Outpatient Rx  Name Route Sig Dispense Refill  . ASPIRIN 81 MG PO CHEW Oral Chew 324 mg by mouth once.      BP 153/95  Pulse 103  Temp(Src) 98.3 F (36.8 C) (Oral)  Resp 13  SpO2 100%  Physical Exam  Constitutional: He is oriented to person,  place, and time. He appears well-developed and well-nourished.  HENT:  Head: Normocephalic and atraumatic.  Eyes: Conjunctivae and EOM are normal. Pupils are equal, round, and reactive to light.  Neck: Trachea normal. Neck supple. No thyromegaly present.  Cardiovascular: Normal rate, regular rhythm, S1 normal, S2 normal and normal pulses.     No systolic murmur is present   No diastolic murmur is present  Pulses:      Radial pulses are 2+ on the right side, and 2+ on the left side.  Pulmonary/Chest: Effort normal and breath sounds normal. He has no wheezes. He has no rhonchi. He has no rales. He exhibits no tenderness.  Abdominal: Soft. Normal appearance and bowel sounds are normal. There is no tenderness. There is no CVA tenderness and negative Murphy's sign.  Musculoskeletal:       BLE:s Calves nontender, no cords or erythema, negative Homans sign  Neurological: He is alert and oriented to person, place, and time. He has normal strength. No cranial nerve deficit or sensory deficit. GCS eye subscore is 4. GCS verbal subscore is 5. GCS motor subscore is 6.  Skin: Skin is warm and dry. No rash noted. He is not diaphoretic.  Psychiatric: His speech is normal.       Cooperative and appropriate    ED Course  Procedures (including critical care time)  Results for orders placed during the hospital encounter of 09/02/11  POCT I-STAT, CHEM 8      Component Value Range   Sodium 139  135 - 145 (mEq/L)   Potassium 3.3 (*)  3.5 - 5.1 (mEq/L)   Chloride 105  96 - 112 (mEq/L)   BUN 19  6 - 23 (mg/dL)   Creatinine, Ser 4.09  0.50 - 1.35 (mg/dL)   Glucose, Bld 811 (*) 70 - 99 (mg/dL)   Calcium, Ion 9.14  7.82 - 1.32 (mmol/L)   TCO2 25  0 - 100 (mmol/L)   Hemoglobin 14.6  13.0 - 17.0 (g/dL)   HCT 95.6  21.3 - 08.6 (%)  POCT I-STAT TROPONIN I      Component Value Range   Troponin i, poc 0.00  0.00 - 0.08 (ng/mL)   Comment 3              Date: 09/02/2011  Rate: 98  Rhythm: normal sinus  rhythm  QRS Axis: normal  Intervals: normal  ST/T Wave abnormalities: ST elevations inferiorly  Conduction Disutrbances:none  Narrative Interpretation: ST elevations in 3 and aVF with depressions in 1 and aVL suggests acute inferior MI  Old EKG Reviewed: changes noted  EKG reviewed and code STEMI activated. Aspirin prior to arrival. IV established. Placed on monitor. Morphine and Heparin initiated. Case discussed with Dr. Riley Kill as above.   MDM   Chest pain with EKG concerning for acute MI.  Cardiologist presented bedside pending Cath Lab.  Chest x-ray reviewed by myself, no wide mediastinum.   IV morphine improving pain. Transported Cath Lab.      Sunnie Nielsen, MD 09/02/11 [phone]

## 2011-09-03 DIAGNOSIS — E785 Hyperlipidemia, unspecified: Secondary | ICD-10-CM

## 2011-09-03 DIAGNOSIS — I2510 Atherosclerotic heart disease of native coronary artery without angina pectoris: Secondary | ICD-10-CM

## 2011-09-03 DIAGNOSIS — D509 Iron deficiency anemia, unspecified: Secondary | ICD-10-CM

## 2011-09-03 DIAGNOSIS — I2119 ST elevation (STEMI) myocardial infarction involving other coronary artery of inferior wall: Secondary | ICD-10-CM

## 2011-09-03 DIAGNOSIS — Z720 Tobacco use: Secondary | ICD-10-CM

## 2011-09-03 LAB — COMPREHENSIVE METABOLIC PANEL
ALT: 26 U/L (ref 0–53)
AST: 34 U/L (ref 0–37)
Alkaline Phosphatase: 59 U/L (ref 39–117)
CO2: 24 mEq/L (ref 19–32)
Calcium: 8.7 mg/dL (ref 8.4–10.5)
GFR calc Af Amer: >90 mL/min (ref >90)
Glucose, Bld: 246 mg/dL — ABNORMAL HIGH (ref 70–99)
Potassium: 3.8 mEq/L (ref 3.5–5.1)
Sodium: 136 mEq/L (ref 135–145)
Total Protein: 5.9 g/dL — ABNORMAL LOW (ref 6.0–8.3)

## 2011-09-03 LAB — CBC
HCT: 35 % — ABNORMAL LOW (ref 39.0–52.0)
RDW: 15.4 % (ref 11.5–15.5)
WBC: 7.2 10*3/uL (ref 4.0–10.5)

## 2011-09-03 NOTE — Progress Notes (Signed)
   Primary cardiologist: Dr. Shirlee Latch  Subjective: No chest pain or shortness of breath. Has tolerated ambulation.   Objective: Blood pressure 109/67, pulse 70, temperature 98.8 F (37.1 C), temperature source Oral, resp. rate 18, height 6\' 2"  (1.88 m), weight 225 lb (102.059 kg), SpO2 98.00%.  Intake/Output Summary (Last 24 hours) at 09/03/11 0942 Last data filed at 09/03/11 0836  Gross per 24 hour  Intake    540 ml  Output    725 ml  Net   -185 ml   Telemetry - Sinus rhythm.  Exam -   General - NAD.  Lungs - Clear, nonlabored.  Cardiac - RRR, no gallop.  Abdomen - NABS.  Extremities - No edema, cath site stable.  Lab Results -  Basic Metabolic Panel:  Lab 09/03/11 4098 09/02/11 0654 09/02/11 0159 09/02/11 0150  NA 136 134* 139 --  K 3.8 4.1 3.3* --  CL 100 104 105 --  CO2 24 21 -- 25  GLUCOSE 246* 167* 172* --  BUN 9 15 19  --  CREATININE 0.87 0.84 1.30 --  CALCIUM 8.7 8.6 -- 9.5  MG -- -- -- --    Liver Function Tests:  Lab 09/03/11 0625 09/02/11 1230  AST 34 60*  ALT 26 30  ALKPHOS 59 60  BILITOT 0.6 0.5  PROT 5.9* 6.1  ALBUMIN 3.1* 3.2*    CBC:  Lab 09/03/11 0615 09/02/11 0654 09/02/11 0159 09/02/11 0150  WBC 7.2 8.3 -- 11.1*  HGB 11.5* 11.6* 14.6 --  HCT 35.0* 35.2* 43.0 --  MCV 74.2* 73.3* -- 73.3*  PLT 246 257 -- 295    Cardiac Enzymes:  Lab 09/02/11 2106 09/02/11 1230 09/02/11 0654  CKTOTAL 510* 686* 754*  CKMB 37.8* 59.2* 56.2*  CKMBINDEX -- -- --  TROPONINI 13.29* 22.78* >25.00*    Coagulation:  Lab 09/02/11 0150  INR 0.97    Current Medications    . aspirin  81 mg Oral Daily  . atorvastatin  80 mg Oral q1800  . Chlorhexidine Gluconate Cloth  6 each Topical Q0600  . enoxaparin  40 mg Subcutaneous Q24H  . lisinopril  5 mg Oral Daily  . metoprolol tartrate  12.5 mg Oral BID  . morphine      . mupirocin ointment   Nasal BID  . nicotine  14 mg Transdermal Daily  . pneumococcal 23 valent vaccine  0.5 mL Intramuscular  Tomorrow-1000  . prasugrel  10 mg Oral Daily  . DISCONTD: aspirin  324 mg Oral Once    Assessment:  1. Inferior wall STEMI status post BMS to RCA on 5/31 per Dr. Riley Kill.  2. Coronary artery disease, residual LAD and OM1 stenoses to be managed medically. LVEF 55-60% with basal inferior hypokinesis by echocardiogram.  3. Blood loss anemia, stable.  4. Tobacco abuse, cessation counseling given.  5. LDL 122, on statin therapy.  Plan:  Patient's wife has already picked up his medications from the [facility] outpatient pharmacy. No changes made to current regimen. He will need followup in the office in one week with Dr. Shirlee Latch or extender.  Jonelle Sidle, M.D., F.A.C.C.

## 2011-09-03 NOTE — Progress Notes (Addendum)
DC'd pt home with wife.  RN reviewed DC instructions and med rec with pt and wife.  Pt and wife verbalized understanding of follow up appointments with MD.  RN removed IV with catheter intact.  Lodema Pilot, RN

## 2011-09-03 NOTE — Discharge Instructions (Signed)
PLEASE REMEMBER TO BRING ALL OF YOUR MEDICATIONS TO EACH OF YOUR FOLLOW-UP OFFICE VISITS. ° °PLEASE ATTEND ALL SCHEDULED FOLLOW-UP APPOINTMENTS.  ° ° ° °

## 2011-09-03 NOTE — Discharge Summary (Signed)
Please see also rounding note from 6/1. 

## 2011-09-04 MED FILL — Dextrose Inj 5%: INTRAVENOUS | Qty: 50 | Status: AC

## 2011-09-05 LAB — GLYCOHEMOGLOBIN, TOTAL: Hemoglobin-A1c: 10.7 % — ABNORMAL HIGH (ref 5.4–7.4)

## 2011-09-12 ENCOUNTER — Encounter: Payer: Self-pay | Admitting: Physician Assistant

## 2011-09-12 ENCOUNTER — Ambulatory Visit (INDEPENDENT_AMBULATORY_CARE_PROVIDER_SITE_OTHER): Payer: 59 | Admitting: Physician Assistant

## 2011-09-12 ENCOUNTER — Encounter: Payer: Self-pay | Admitting: *Deleted

## 2011-09-12 VITALS — BP 102/63 | HR 110 | Ht 74.0 in | Wt 224.0 lb

## 2011-09-12 DIAGNOSIS — I2119 ST elevation (STEMI) myocardial infarction involving other coronary artery of inferior wall: Secondary | ICD-10-CM

## 2011-09-12 DIAGNOSIS — Z720 Tobacco use: Secondary | ICD-10-CM

## 2011-09-12 DIAGNOSIS — F17200 Nicotine dependence, unspecified, uncomplicated: Secondary | ICD-10-CM

## 2011-09-12 DIAGNOSIS — I2510 Atherosclerotic heart disease of native coronary artery without angina pectoris: Secondary | ICD-10-CM

## 2011-09-12 DIAGNOSIS — E785 Hyperlipidemia, unspecified: Secondary | ICD-10-CM

## 2011-09-12 MED ORDER — FAMOTIDINE 20 MG PO TABS: 20.0000 mg | ORAL_TABLET | ORAL | Status: DC

## 2011-09-12 MED ORDER — METOPROLOL TARTRATE 25 MG PO TABS: 25.0000 mg | ORAL_TABLET | Freq: Two times a day (BID) | ORAL | Status: DC

## 2011-09-12 NOTE — Progress Notes (Signed)
[address] [place], [place]  [address] Phone: [phone] Fax:  [phone]  Date:  09/12/2011   Name:  Derrick Todd   DOB:  02/28/1961   MRN:  [id number]  PCP:  Derrick Oats, MD, MD  Primary Cardiologist:  Dr. Marca Ancona  Primary Electrophysiologist:  None    History of Present Illness: Derrick Todd is a 51 y.o. male who returns for post hospital follow up.  He has a history of GERD and tobacco abuse.  He was admitted 5/31-6/1 with an inferior STEMI.  He was taken emergently to the cardiac catheterization lab by Dr. Riley Kill:  Proximal-mid LAD 20-30%, mid 70%, superior trifurcation of the OM 70% proximally, proximal RCA 20%, mid 40%, AM branch 50-60%, distal 70% followed by occlusion.  EF 55%.  PCI:  BMS to the distal RCA.  2-D echocardiogram 09/02/11: Mild LVH, EF 55-60%, basal inferior HK, mild LAE, PASP 32.  Since discharge, he is doing well.  He denies any chest discomfort or shortness of breath.  He does have some indigestion symptoms.  He is somewhat concerned about this.  He is trying to quit smoking.  He's been smoking a few cigarettes a day.  He did get nicotine patches today for those on shortly before coming into the office.  He denies orthopnea, PND or edema.  He denies syncope.  Wt Readings from Last 3 Encounters:  09/12/11 224 lb (101.606 kg)  09/02/11 225 lb (102.059 kg)  09/02/11 225 lb (102.059 kg)     Potassium  Date/Time Value Range Status  09/03/2011  6:25 AM 3.8  3.5-5.1 (mEq/L) Final     Creatinine, Ser  Date/Time Value Range Status  09/03/2011  6:25 AM 0.87  0.50-1.35 (mg/dL) Final     ALT  Date/Time Value Range Status  09/03/2011  6:25 AM 26  0-53 (U/L) Final     TSH  Date/Time Value Range Status  09/02/2011  8:56 AM 2.921  0.350-4.500 (uIU/mL) Final     Hemoglobin  Date/Time Value Range Status  09/03/2011  6:15 AM 11.5* 13.0-17.0 (g/dL) Final    Past Medical History  Diagnosis Date  . CAD (coronary artery  disease)      inferior STEMI 5/13:  LHC Proximal-mid LAD 20-30%, mid 70%, superior trifurcation of the OM 70% proximally, proximal RCA 20%, mid 40%, AM branch 50-60%, distal 70% followed by occlusion.  EF 55%.  PCI:  BMS to the distal RCA.  2-D echocardiogram 09/02/11: Mild LVH, EF 55-60%, basal inferior HK, mild LAE, PASP 32.  Marland Kitchen Dyslipidemia   . GERD (gastroesophageal reflux disease)   . Tobacco abuse   . DM2 (diabetes mellitus, type 2)     Current Outpatient Prescriptions  Medication Sig Dispense Refill  . aspirin 81 MG EC tablet Take 1 tablet (81 mg total) by mouth daily.  30 tablet  6  . atorvastatin (LIPITOR) 80 MG tablet Take 1 tablet (80 mg total) by mouth at bedtime.  30 tablet  6  . lisinopril (PRINIVIL,ZESTRIL) 5 MG tablet Take 1 tablet (5 mg total) by mouth daily.  30 tablet  6  . metoprolol tartrate (LOPRESSOR) 25 MG tablet Take 0.5 tablets (12.5 mg total) by mouth 2 (two) times daily.  60 tablet  6  . nitroGLYCERIN (NITROSTAT) 0.4 MG SL tablet Place 1 tablet (0.4 mg total) under the tongue every 5 (five) minutes x 3 doses as needed for chest pain.  25 tablet  4  . prasugrel (EFFIENT) 10 MG  TABS Take 1 tablet (10 mg total) by mouth daily.  30 tablet  11    Allergies: No Known Allergies  History  Substance Use Topics  . Smoking status: Current Everyday Smoker  . Smokeless tobacco: Never Used  . Alcohol Use: No     ROS:  Please see the history of present illness.    All other systems reviewed and negative.   PHYSICAL EXAM: VS:  BP 102/63  Pulse 110  Ht 6\' 2"  (1.88 m)  Wt 224 lb (101.606 kg)  BMI 28.76 kg/m2 Well nourished, well developed, in no acute distress HEENT: normal Neck: no JVD Cardiac:  normal S1, S2; RRR; no murmur Lungs:  clear to auscultation bilaterally, no wheezing, rhonchi or rales Abd: soft, nontender, no hepatomegaly Ext: no edema; right groin without hematoma or bruit, right wrist without hematoma or bruit Skin: warm and dry Neuro:  CNs 2-12  intact, no focal abnormalities noted  EKG:  Sinus tachycardia, heart rate 101, normal axis, inferior Q waves, no change from prior tracing   ASSESSMENT AND PLAN:  1.  Coronary Artery Disease He is doing well post MI. 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  He understands the importance of dual antiplatelet therapy. He is trying to quit smoking. He is interested in cardiac rehabilitation.  We will make the referral. I would like to see his heart rate lower.  It may be up today in response to the recent initiation of his nicotine patch. Increase Lopressor to 25 mg twice a day.  He will contact us if he cannot tolerate this. I will let him go back to work this coming weekend for 8 hour shifts.  He can return to Full shifts thereafter. Follow up with Dr. Shirlee Latch in 6-8 weeks. Check a basic metabolic panel today with the initiation of lisinopril.  2.  Tobacco Abuse He is working on quitting.  3.  Hyperlipidemia Check lipids and LFTs in 6 weeks.  4.  GERD He is having some indigestion symptoms.  Start Pepcid 20 mg twice a day.   Signed, Tereso Newcomer, PA-C  4:19 PM 09/12/2011

## 2011-09-12 NOTE — Patient Instructions (Signed)
Your physician recommends that you return for lab work in: TODAY BMET  11/02/11 FASTING LIPID AND LIVER PANEL  START PEPCID 20 MG 1 TABLET TWICE DAILY FOR 3-4 WEEKS THEN PRN  INCREASE METOPROLOL TARTRATE TO 25 MG TWICE DAILY  You have been referred to CARDIAC REHAB @ [facility]  YOU MAY RETURN TO WORK THIS Saturday 09/17/11 8 HOUR SHIFT FOR YOUR 1ST WEEK BACK TO WORK THEN AFTER 1ST WEEK YOU MAY RETURN TO YOUR FULL SHIFT HOURS PER SCOTT WEAVER, PAC  KEEP APPT WITH DR. Shirlee Latch 10/27/11

## 2011-09-13 LAB — BASIC METABOLIC PANEL
BUN: 13 mg/dL (ref 6–23)
Calcium: 9.5 mg/dL (ref 8.4–10.5)
Creatinine, Ser: 1 mg/dL (ref 0.4–1.5)
GFR: 96.2 mL/min (ref >60.00)
Potassium: 3.9 mEq/L (ref 3.5–5.1)

## 2011-09-14 ENCOUNTER — Encounter: Payer: Self-pay | Admitting: *Deleted

## 2011-09-14 ENCOUNTER — Other Ambulatory Visit: Payer: Self-pay | Admitting: *Deleted

## 2011-09-14 ENCOUNTER — Telehealth: Payer: Self-pay | Admitting: Cardiology

## 2011-09-14 MED ORDER — FAMOTIDINE 20 MG PO TABS: ORAL_TABLET | ORAL | Status: DC

## 2011-09-14 NOTE — Telephone Encounter (Signed)
Please return call to patient at [phone] regarding return to work

## 2011-09-14 NOTE — Telephone Encounter (Signed)
Spoke with pt. Pt requesting return to work date change from 09/17/11 to 09/18/11. This is OK with Dr Shirlee Latch.

## 2011-09-14 NOTE — Telephone Encounter (Signed)
Pt is aware letter ready for pick up at the front desk.

## 2011-09-20 ENCOUNTER — Telehealth: Payer: Self-pay | Admitting: Cardiology

## 2011-09-20 NOTE — Telephone Encounter (Signed)
Patient called stated he has FMLA forms to be filled out.Advised to bring to office and forms will be given to person responsible for having completed.

## 2011-09-20 NOTE — Telephone Encounter (Signed)
Please return call to patient at [phone] regarding medical leave paperwork.

## 2011-09-20 NOTE — Telephone Encounter (Signed)
Walk in pt Form " Pt Dropped Off [employer] paper" for Completion Sent to [facility] 09/20/11/KM

## 2011-10-03 ENCOUNTER — Telehealth: Payer: Self-pay | Admitting: Cardiology

## 2011-10-03 NOTE — Telephone Encounter (Signed)
Spoke with Derrick Todd, Derrick Todd states he has bilateral thigh pain that has gradually become worse in the last few days and thinks it is from taking lipitor. I reviewed with Dr Shirlee Latch. Dr Waynetta Pean recommended Derrick Todd decrease lipitor to 40mg  daily and take coenzyme Q10 200mg  daily. Derrick Todd will call in 7-10 days if his symptoms do not improve.

## 2011-10-03 NOTE — Telephone Encounter (Signed)
New msg Pt called and said that he is having pain in his legs . He thinks it is coming from lipitor.

## 2011-10-10 ENCOUNTER — Telehealth: Payer: Self-pay | Admitting: Internal Medicine

## 2011-10-10 NOTE — Telephone Encounter (Signed)
Pt came into Office this am with paperwork from [facility] And a Fax cover Sheet to [employer]. He stated to Me Renee W/ [employer] faxed over his [employer] without [employer] on it and he wanted a Refund From [employer], The Original form was in My  [employer] and [place] faxed it, I have Refaxed it again this am to [employer] @ [phone].I will call and speak with  [employer] about Refund and let Pt Know.

## 2011-10-10 NOTE — Telephone Encounter (Addendum)
Original was Refaxed to Amy Rooney/[employer] @ [phone] again by me, I have called Pt let him know Original paperwork was signed By Dr.McLean on 6/26 he was called by Tammy and she Left a Message with His Daughter  Letting her know paperwork was faxed and ready for pick up,he ok'd all this And Original has Been Mailed to pt's Home address  10/10/11/KM

## 2011-10-17 ENCOUNTER — Telehealth: Payer: Self-pay | Admitting: Cardiology

## 2011-10-17 NOTE — Telephone Encounter (Signed)
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 Elease Hashimoto. He reports having a "funny feeling in his chest", but no chest pain and an ache in his right arm. The ache feels similar to the pain he had prior to his MI last month. He reports medication compliance. He took 1 SL NTG before coming to work with some relief in his pain. Dr. Elease Hashimoto and I recommended he have an EKG checked in the ER. EKG showed sinus rhythm without acute ST/T changes. Dr. Elease Hashimoto also instructed him to take it easy tonight and take another NTG if the pain persists. If he develops chest pain or has any concern then he was instructed to check into the ER immediately.   Derrick Todd 10/17/2011 7:05 PM

## 2011-10-17 NOTE — Telephone Encounter (Signed)
LMTCB

## 2011-10-17 NOTE — Telephone Encounter (Signed)
Pt  Feels funny in his chest and he wants to talk about it

## 2011-10-17 NOTE — Telephone Encounter (Signed)
Returned call to Mr. Fouts. He did not answer. I left a message asking him to call back if he still needed assistance.  [place], PA-C 10/17/2011 6:39 PM

## 2011-10-18 ENCOUNTER — Telehealth: Payer: Self-pay | Admitting: Cardiology

## 2011-10-18 NOTE — Telephone Encounter (Signed)
Fast busy #.

## 2011-10-18 NOTE — Telephone Encounter (Signed)
Patient returning nurse call, he can be reached on mobile  [phone]

## 2011-10-18 NOTE — Telephone Encounter (Signed)
Line fast busy.

## 2011-10-18 NOTE — Telephone Encounter (Signed)
LMTCB at both phone numbers listed.

## 2011-10-27 ENCOUNTER — Encounter: Payer: Commercial Managed Care - PPO | Admitting: Cardiology

## 2011-10-27 ENCOUNTER — Other Ambulatory Visit: Payer: 59

## 2011-10-27 NOTE — Telephone Encounter (Signed)
Spoke with pt today. Pt actually had an appt with Dr Shirlee Latch today at 8:30am. Pt states that he thought the appt was at 1PM. I have rescheduled the appt to 10/28/11 11:15 am  with Dr Shirlee Latch. Pt aware of the appt time and location.

## 2011-10-28 ENCOUNTER — Encounter: Payer: Self-pay | Admitting: Cardiology

## 2011-10-28 ENCOUNTER — Ambulatory Visit (INDEPENDENT_AMBULATORY_CARE_PROVIDER_SITE_OTHER): Payer: 59 | Admitting: Cardiology

## 2011-10-28 VITALS — BP 126/62 | HR 76 | Ht 74.0 in | Wt 225.0 lb

## 2011-10-28 DIAGNOSIS — E785 Hyperlipidemia, unspecified: Secondary | ICD-10-CM

## 2011-10-28 DIAGNOSIS — Z720 Tobacco use: Secondary | ICD-10-CM

## 2011-10-28 DIAGNOSIS — I2510 Atherosclerotic heart disease of native coronary artery without angina pectoris: Secondary | ICD-10-CM

## 2011-10-28 DIAGNOSIS — F17200 Nicotine dependence, unspecified, uncomplicated: Secondary | ICD-10-CM

## 2011-10-28 NOTE — Patient Instructions (Addendum)
Your physician recommends that you have a FASTING lipid profile /liver profile. Call me and let me know that you have had this done. Luana Shu [phone]  Your physician recommends that you schedule a follow-up appointment in: 4 months with Dr Shirlee Latch.   Your physician discussed the hazards of tobacco use. Tobacco use cessation is recommended and techniques and options to help you quit were discussed.

## 2011-10-31 NOTE — Assessment & Plan Note (Signed)
Check lipids/LFTs, goal LDL < 70.  

## 2011-10-31 NOTE — Assessment & Plan Note (Signed)
I again strongly encouraged him to quit smoking.  He is taking wellbutrin and using a nicotine patch.  He has cut back.  Needs to continue efforts.

## 2011-10-31 NOTE — Assessment & Plan Note (Signed)
Stable s/p NSTEMI.  Continue Effient for a year, no bleeding sequelae.  Continue ASA, statin, ACEI, metoprolol.

## 2011-10-31 NOTE — Progress Notes (Signed)
Patient ID: Derrick Todd, male   DOB: 1961/03/07, 51 y.o.   MRN: [id number] PCP: Dr. Lovell Sheehan  51 yo with history of CAD s/p NSTEMI in 5/13 presents for followup.  At time of NSTEMI, he had BMS to totally occluded distal RCA.  Since discharge, he has been doing well.  He is back at work full time.  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  Echo in 5/13 showed preserved EF.  He sometimes feels an "unusual sensation" in his chest, especially with stress, but no chest pain at all similar to his MI.  No exertional dyspnea.  He is still smoking about 5 cigarettes/day but trying to quit.   Labs (5/13): LDL 122, HDL 32 Labs (6/13): K 3.9, creatinine 1.0  PMH: 1. CAD: NSTEMI 5/13 with LHC showing 70% OM, 70% mLAD, total occlusion distal RCA, EF 55%.  Had 2 x 18 BMS to distal RCA.  Echo (5/13): EF 55-60% with basal inferior hypokinesis.  2. Hyperlipidemia 3. 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  Wife is nurse at [employer].  2 daughters.  Smokes about 5 cigarettes a day now.   FH: CAD  ROS: All systems reviewed and negative except as per HPI.   Current Outpatient Prescriptions  Medication Sig Dispense Refill  . aspirin 81 MG EC tablet Take 1 tablet (81 mg total) by mouth daily.  30 tablet  6  . atorvastatin (LIPITOR) 80 MG tablet 1/2 tablet daily      . buPROPion (WELLBUTRIN) 75 MG tablet Take 75 mg by mouth daily.      . Coenzyme Q10 200 MG TABS Take by mouth.    0  . famotidine (PEPCID) 20 MG tablet TAKE 1 TABLET TWICE DAILY BY MOUTH FOR 3-4 WEEKS THEN PRN  120 tablet  3  . lisinopril (PRINIVIL,ZESTRIL) 5 MG tablet Take 1 tablet (5 mg total) by mouth daily.  30 tablet  6  . metoprolol tartrate (LOPRESSOR) 25 MG tablet Take 1 tablet (25 mg total) by mouth 2 (two) times daily.  60 tablet  11  . nicotine (NICODERM CQ - DOSED IN MG/24 HOURS) 21 mg/24hr patch Place 1 patch onto the skin daily.      . nitroGLYCERIN (NITROSTAT) 0.4 MG SL tablet Place 1  tablet (0.4 mg total) under the tongue every 5 (five) minutes x 3 doses as needed for chest pain.  25 tablet  4  . prasugrel (EFFIENT) 10 MG TABS Take 1 tablet (10 mg total) by mouth daily.  30 tablet  11    BP 126/62  Pulse 76  Ht 6\' 2"  (1.88 m)  Wt 225 lb (102.059 kg)  BMI 28.89 kg/m2  SpO2 98% General: NAD Neck: No JVD, no thyromegaly or thyroid nodule.  Lungs: Clear to auscultation bilaterally with normal respiratory effort. CV: Nondisplaced PMI.  Heart regular S1/S2, no S3/S4, no murmur.  No peripheral edema.  No carotid bruit.  Normal pedal pulses.  Abdomen: Soft, nontender, no hepatosplenomegaly, no distention.  Neurologic: Alert and oriented x 3.  Psych: Normal affect. Extremities: No clubbing or cyanosis.

## 2012-02-22 ENCOUNTER — Encounter: Payer: Self-pay | Admitting: Cardiology

## 2012-02-22 ENCOUNTER — Ambulatory Visit (INDEPENDENT_AMBULATORY_CARE_PROVIDER_SITE_OTHER): Payer: 59 | Admitting: Cardiology

## 2012-02-22 VITALS — BP 118/62 | HR 92 | Ht 74.0 in | Wt 229.0 lb

## 2012-02-22 DIAGNOSIS — E785 Hyperlipidemia, unspecified: Secondary | ICD-10-CM

## 2012-02-22 DIAGNOSIS — I2510 Atherosclerotic heart disease of native coronary artery without angina pectoris: Secondary | ICD-10-CM

## 2012-02-22 DIAGNOSIS — Z720 Tobacco use: Secondary | ICD-10-CM

## 2012-02-22 DIAGNOSIS — I2584 Coronary atherosclerosis due to calcified coronary lesion: Secondary | ICD-10-CM

## 2012-02-22 DIAGNOSIS — F17200 Nicotine dependence, unspecified, uncomplicated: Secondary | ICD-10-CM

## 2012-02-22 MED ORDER — PANTOPRAZOLE SODIUM 40 MG PO TBEC: 40.0000 mg | DELAYED_RELEASE_TABLET | Freq: Every day | ORAL | Status: DC

## 2012-02-22 NOTE — Progress Notes (Signed)
Patient ID: Derrick Todd, male   DOB: [birth year], 51 y.o.   MRN: [id number] PCP: Dr. Lovell Sheehan  51 yo with history of CAD s/p NSTEMI in 5/13 presents for followup.  At time of NSTEMI, he had BMS to totally occluded distal RCA.  Since discharge, he has been doing well.  He is back at work full time he is now working at [employer] which has been lower stress for him.  Echo in 5/13 showed preserved EF.  No chest pain and no exertional dyspnea.  He walks on his treadmill for 2-3 miles 2-3 times a week. He has occasional GERD-type symptoms after meals, especially when lying down after dinner.  He continues to smoke 4-5 cigarettes/day.   Labs (5/13): LDL 122, HDL 32 Labs (6/13): K 3.9, creatinine 1.0  PMH: 1. CAD: NSTEMI 5/13 with LHC showing 70% OM, 70% mLAD, total occlusion distal RCA, EF 55%.  Had 2 x 18 BMS to distal RCA.  Echo (5/13): EF 55-60% with basal inferior hypokinesis.  2. Hyperlipidemia 3. 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  2 daughters.  Smokes about 5 cigarettes a day now.   FH: CAD  Current Outpatient Prescriptions  Medication Sig Dispense Refill  . aspirin 81 MG EC tablet Take 1 tablet (81 mg total) by mouth daily.  30 tablet  6  . atorvastatin (LIPITOR) 80 MG tablet 1/2 tablet daily      . buPROPion (WELLBUTRIN) 75 MG tablet Take 75 mg by mouth daily.      . famotidine (PEPCID) 20 MG tablet TAKE 1 TABLET TWICE DAILY BY MOUTH FOR 3-4 WEEKS THEN PRN  120 tablet  3  . glipiZIDE (GLUCOTROL) 10 MG tablet Take 10 mg by mouth 2 (two) times daily before a meal.      . lisinopril (PRINIVIL,ZESTRIL) 5 MG tablet Take 1 tablet (5 mg total) by mouth daily.  30 tablet  6  . metoprolol tartrate (LOPRESSOR) 25 MG tablet Take 1 tablet (25 mg total) by mouth 2 (two) times daily.  60 tablet  11  . nicotine (NICODERM CQ - DOSED IN MG/24 HOURS) 21 mg/24hr patch Place 1 patch onto the skin daily.      . nitroGLYCERIN  (NITROSTAT) 0.4 MG SL tablet Place 1 tablet (0.4 mg total) under the tongue every 5 (five) minutes x 3 doses as needed for chest pain.  25 tablet  4  . Omega-3 Fatty Acids (FISH OIL) 1200 MG CAPS Take by mouth daily.      . prasugrel (EFFIENT) 10 MG TABS Take 1 tablet (10 mg total) by mouth daily.  30 tablet  11  . pantoprazole (PROTONIX) 40 MG tablet Take 1 tablet (40 mg total) by mouth daily.  90 tablet  3    BP 118/62  Pulse 92  Ht 6\' 2"  (1.88 m)  Wt 229 lb (103.874 kg)  BMI 29.40 kg/m2 General: NAD Neck: No JVD, no thyromegaly or thyroid nodule.  Lungs: Clear to auscultation bilaterally with normal respiratory effort. CV: Nondisplaced PMI.  Heart regular S1/S2, +S4, no murmur.  No peripheral edema.  No carotid bruit.  Normal pedal pulses.  Abdomen: Soft, nontender, no hepatosplenomegaly, no distention.  Neurologic: Alert and oriented x 3.  Psych: Normal affect. Extremities: No clubbing or cyanosis.   Assessment/Plan: 1. CAD: Stable with no ischemic symptoms.  Continue ASA 81, statin, lisinopril, metoprolol, Effient.  He will be able  to stop Effient after 5/14.  I want him to increase his exercise regimen to at least 5 days a week.  2. GERD: I gave him a prescription for Protonix which has worked in the past.  3. Smoking: I strongly encouraged him to quit.  He is using Wellbutrin and can add nicotine patches.   4. Hyperlipidemia: Check lipids with goal LDL< 70.   Marca Ancona 02/22/2012

## 2012-02-22 NOTE — Patient Instructions (Addendum)
Start protonix 40mg  daily.  Your physician recommends that you return for a fasting lipid profile/liver profile/BMET  on Monday November 25,2013. Do not eat or drink after midnight. The lab opens at 7:30am.  Your physician wants you to follow-up in: 6 months with Dr Shirlee Latch. (May 2014).  You will receive a reminder letter in the mail two months in advance. If you don't receive a letter, please call our office to schedule the follow-up appointment.

## 2012-04-06 ENCOUNTER — Other Ambulatory Visit: Payer: Self-pay | Admitting: Cardiology

## 2012-04-06 MED ORDER — ATORVASTATIN CALCIUM 80 MG PO TABS: ORAL_TABLET | ORAL | Status: DC

## 2012-04-06 MED ORDER — LISINOPRIL 5 MG PO TABS: 5.0000 mg | ORAL_TABLET | Freq: Every day | ORAL | Status: DC

## 2012-04-06 MED ORDER — ASPIRIN 81 MG PO TBEC: 81.0000 mg | DELAYED_RELEASE_TABLET | Freq: Every day | ORAL | Status: DC

## 2012-07-09 ENCOUNTER — Ambulatory Visit (HOSPITAL_BASED_OUTPATIENT_CLINIC_OR_DEPARTMENT_OTHER)
Admission: RE | Admit: 2012-07-09 | Discharge: 2012-07-09 | Disposition: A | Discharge status: Home / Self Care | Payer: 59 | Source: Ambulatory Visit | Attending: Internal Medicine | Admitting: Internal Medicine

## 2012-08-08 ENCOUNTER — Encounter: Payer: Self-pay | Admitting: Internal Medicine

## 2012-12-11 ENCOUNTER — Other Ambulatory Visit: Payer: Self-pay | Admitting: Cardiology

## 2013-03-21 ENCOUNTER — Telehealth: Payer: Self-pay | Admitting: Cardiology

## 2013-03-21 NOTE — Telephone Encounter (Signed)
Spoke with patient.

## 2013-03-21 NOTE — Telephone Encounter (Signed)
New Message  Pt called requests a call back with the results of his cath from a [facility]. Pt is adamant about speaking with the office of Dr. Riley Kill. Advised pt that Dr. Riley Kill is no longer a provider here. Pt is requesting a call back from the nurse for results of Cath completed at [facility] [place]. Asked pt to have those records faxed over for appt on 12/19 @ 12:10 with Tereso Newcomer. Please states he will call and have them faxed over.

## 2013-03-21 NOTE — Telephone Encounter (Signed)
New Prob    Pt states he lost signal while speaking to you earlier. Please call back.

## 2013-03-21 NOTE — Telephone Encounter (Signed)
Follow up    Pt want to take to Dr Shirlee Latch or Thurston Hole.  He would not tell me what he wanted

## 2013-03-21 NOTE — Telephone Encounter (Signed)
Follow Up  ° °Pt returned call//SR  °

## 2013-03-22 ENCOUNTER — Ambulatory Visit (INDEPENDENT_AMBULATORY_CARE_PROVIDER_SITE_OTHER): Payer: 59 | Admitting: Physician Assistant

## 2013-03-22 ENCOUNTER — Ambulatory Visit: Payer: 59 | Admitting: Physician Assistant

## 2013-03-22 ENCOUNTER — Telehealth: Payer: Self-pay | Admitting: *Deleted

## 2013-03-22 ENCOUNTER — Encounter: Payer: Self-pay | Admitting: Physician Assistant

## 2013-03-22 VITALS — BP 138/86 | HR 91 | Ht 74.0 in | Wt 226.0 lb

## 2013-03-22 DIAGNOSIS — I2119 ST elevation (STEMI) myocardial infarction involving other coronary artery of inferior wall: Secondary | ICD-10-CM

## 2013-03-22 DIAGNOSIS — I10 Essential (primary) hypertension: Secondary | ICD-10-CM

## 2013-03-22 DIAGNOSIS — I2510 Atherosclerotic heart disease of native coronary artery without angina pectoris: Secondary | ICD-10-CM

## 2013-03-22 DIAGNOSIS — E785 Hyperlipidemia, unspecified: Secondary | ICD-10-CM

## 2013-03-22 MED ORDER — METOPROLOL TARTRATE 50 MG PO TABS: 50.0000 mg | ORAL_TABLET | Freq: Two times a day (BID) | ORAL | Status: DC

## 2013-03-22 MED ORDER — ROSUVASTATIN CALCIUM 20 MG PO TABS: 20.0000 mg | ORAL_TABLET | Freq: Every day | ORAL | Status: DC

## 2013-03-22 NOTE — Telephone Encounter (Signed)
°  Follow up   Pt called about CD of [place] and was told we should recive it 12/22 or 12/23 if not today.

## 2013-03-22 NOTE — Progress Notes (Signed)
[address] [place], [place]  [address] Phone: [phone] Fax:  [phone]  Date:  03/22/2013   ID:  Derrick Todd, [place] December 06, 1960, MRN [id number]  PCP:  Ron Parker, MD  Cardiologist:  Dr. Marca Ancona     History of Present Illness: Derrick Todd is a 52 y.o. male with a hx of T2DM, HL, CAD, s/p NSTEMI 5/13 treated with BMS to the RCA.  He had moderate residual disease in the OM and LAD treated medically.  EF was preserved.  Echo (08/2011):  Mild LVH, EF 55-60%, inf HK, mild LAE, PASP 32.  Last seen by Dr. Marca Ancona in 02/2012.    He was recently admitted to [facility] 12/17-12/18 with an inferior STEMI.  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  Emergent LHC (03/20/2013):  mLAD 60%, dCFX 70-80, mRCA 95, dRCA stent ok.  PCI:  Vision (2.5 x 15 mm) BMS to the mRCA.  Echo (03/20/2013):  Mild TR, EF 50-55%, mild LVH.  He was told that he may need further testing for his LAD lesion.  The patient tells me that he was told he had a 90% lesion in the LAD and set up f/u today for further intervention.  I spent > 20 minutes reviewing records today.    Since d/c, he denies any further angina.  He has some dull chest pain which is similar to what he felt when he last had PCI performed.  He denies dyspnea, syncope, orthopnea, PND, edema.  R groin is ok.    Recent Labs: No results found for requested labs within last 365 days. 03/20/2013:  K 3.8, creatinine 1.00, ALT 21, TSH 3.92, Hgb 12.4, LDL 153 [facility]).  Wt Readings from Last 3 Encounters:  03/22/13 226 lb (102.513 kg)  02/22/12 229 lb (103.874 kg)  10/28/11 225 lb (102.059 kg)     Past Medical History  Diagnosis Date  . Dyslipidemia   . GERD (gastroesophageal reflux disease)   . Tobacco abuse   . DM2 (diabetes mellitus, type 2)   . Hx of echocardiogram     a. 2-D echocardiogram 09/02/11: Mild LVH, EF 55-60%, basal inferior HK, mild LAE, PASP 32.;   b.  Echo (03/20/2013):  Mild TR, EF 50-55%, mild LVH.  Marland Kitchen CAD (coronary artery disease)     a. inferior STEMI 5/13:  LHC prox-mid LAD 20-30%, mid LAD 70%, pOM 70%, pRCA 20%, mid 40%, AM Br 50-60%, dRCA 70% then 100%.  EF 55%.  PCI:  BMS to the distal RCA.;  b. inf STEMI (03/2013 at [facility]): LHC -  mLAD 60%, dCFX 70-80, mRCA 95, dRCA stent ok.  PCI:  Vision (2.5 x 15 mm) BMS to the mRCA.    Current Outpatient Prescriptions  Medication Sig Dispense Refill  . aspirin 81 MG EC tablet Take 1 tablet (81 mg total) by mouth daily.  30 tablet  6  . atorvastatin (LIPITOR) 80 MG tablet 1/2 tablet daily  30 tablet  5  . buPROPion (WELLBUTRIN) 75 MG tablet Take 75 mg by mouth daily.      . famotidine (PEPCID) 20 MG tablet TAKE 1 TABLET TWICE DAILY BY MOUTH FOR 3-4 WEEKS THEN PRN  120 tablet  3  . glipiZIDE (GLUCOTROL) 10 MG tablet Take 10 mg by mouth 2 (two) times daily before a meal.      . lisinopril (PRINIVIL,ZESTRIL) 5 MG tablet Take  1 tablet (5 mg total) by mouth daily.  30 tablet  6  . metoprolol tartrate (LOPRESSOR) 25 MG tablet TAKE 1 TABLET BY MOUTH 2 TIMES DAILY.  60 tablet  0  . nicotine (NICODERM CQ - DOSED IN MG/24 HOURS) 21 mg/24hr patch Place 1 patch onto the skin daily.      . nitroGLYCERIN (NITROSTAT) 0.4 MG SL tablet Place 1 tablet (0.4 mg total) under the tongue every 5 (five) minutes x 3 doses as needed for chest pain.  25 tablet  4  . Omega-3 Fatty Acids (FISH OIL) 1200 MG CAPS Take by mouth daily.      . pantoprazole (PROTONIX) 40 MG tablet Take 1 tablet (40 mg total) by mouth daily.  90 tablet  3  . prasugrel (EFFIENT) 10 MG TABS Take 1 tablet (10 mg total) by mouth daily.  30 tablet  11   No current facility-administered medications for this visit.    Allergies:   Review of patient's allergies indicates no known allergies.   Social History:  The patient  reports that he has been smoking.  He has never used smokeless tobacco. He reports that he does not drink alcohol or use  illicit drugs.   Family History:  The patient's family history includes Depression in his mother; Diabetes in his brother and mother; Heart attack in his maternal grandfather; Heart failure in his maternal grandfather; Hypertension in his maternal grandfather and mother; Stroke in his mother.   ROS:  Please see the history of present illness.      All other systems reviewed and negative.   PHYSICAL EXAM: VS:  BP 138/86  Pulse 91  Ht 6\' 2"  (1.88 m)  Wt 226 lb (102.513 kg)  BMI 29.00 kg/m2 Well nourished, well developed, in no acute distress HEENT: normal Neck: no JVD Cardiac:  normal S1, S2; RRR; no murmur Lungs:  clear to auscultation bilaterally, no wheezing, rhonchi or rales Abd: soft, nontender, no hepatomegaly Ext: no edema; right groin without hematoma or bruit  Skin: warm and dry Neuro:  CNs 2-12 intact, no focal abnormalities noted  EKG:  NSR, HR 91, normal axis, inf Q waves, inf TWI, no significant changes     ASSESSMENT AND PLAN:  1. CAD:  He is doing well after recent Inf STEMI treated with a BMS to the RCA at [facility].  No further angina.  We discussed the importance of dual antiplatelet therapy. He is back on Effient.  He was under the impression he has a 90% lesion in the LAD.  We reviewed his cath report.  The residual disease is similar to the findings from 08/2011.  I have asked him to obtain the cath film CD for our review.  Once I receive the CD, I will have Dr. Marca Ancona or one of the other cardiologists review the films.   2. Hypertension:  BP controlled. HR could be better.  I will increase Metoprolol to 50 mg BID.  Continue ACEI. 3. Hyperlipidemia:  LDL was high in the hospital.  He was off his statin for several months prior to this.  He had trouble tolerating Lipitor due to myalgias.  He has a Rx for Simvastatin 80 mg.  I would rather avoid him resuming this high of a dose.  I will have him start Crestor 20 mg QD. Check Lipids and LFTs in 6 weeks.    4. Diabetes Mellitus: f/u with primary care.  5. Tobacco Abuse:  He quit smoking  8 mos ago. 6. Disposition:  F/u with Dr. Marca Ancona or me in 2 weeks.   Signed, Tereso Newcomer, PA-C  03/22/2013 12:21 PM

## 2013-03-22 NOTE — Patient Instructions (Addendum)
START CRESTOR 20 MG DAILY; RX SENT IN TODAY  STOP LIPITOR  INCREASE METOPROLOL TO 50 MG TWICE DAILY; RX SENT IN TODAY  FASTING LIPID AND LIVER PANEL TO BE DONE MED CENTER 6 WEEKS  PLEASE FOLLOW UP 04/09/13 @ 11:50 WITH SCOTT WEAVER, PAC SAME DAY DR. Shirlee Latch IS IN THE OFFICE

## 2013-03-25 NOTE — Telephone Encounter (Signed)
cb pt to advise was given an rx for labs to be done at [facility]. when he saw Bing Neighbors. PA

## 2013-04-08 ENCOUNTER — Encounter: Payer: Self-pay | Admitting: Physician Assistant

## 2013-04-08 ENCOUNTER — Other Ambulatory Visit: Payer: 59

## 2013-04-08 ENCOUNTER — Other Ambulatory Visit: Payer: Self-pay | Admitting: Physician Assistant

## 2013-04-08 LAB — LIPID PANEL
Cholesterol: 179 mg/dL (ref 0–200)
HDL: 43 mg/dL (ref >39)
LDL Cholesterol: 108 mg/dL — ABNORMAL HIGH (ref 0–99)
Total CHOL/HDL Ratio: 4.2 Ratio
Triglycerides: 138 mg/dL (ref <150)
VLDL: 28 mg/dL (ref 0–40)

## 2013-04-08 LAB — HEPATIC FUNCTION PANEL
ALT: 31 U/L (ref 0–53)
AST: 22 U/L (ref 0–37)
Albumin: 4.3 g/dL (ref 3.5–5.2)
Alkaline Phosphatase: 63 U/L (ref 39–117)
BILIRUBIN DIRECT: 0.1 mg/dL (ref 0.0–0.3)
Indirect Bilirubin: 0.6 mg/dL (ref 0.0–0.9)
Total Bilirubin: 0.7 mg/dL (ref 0.3–1.2)
Total Protein: 6.9 g/dL (ref 6.0–8.3)

## 2013-04-09 ENCOUNTER — Encounter: Payer: Self-pay | Admitting: Physician Assistant

## 2013-04-09 ENCOUNTER — Ambulatory Visit (INDEPENDENT_AMBULATORY_CARE_PROVIDER_SITE_OTHER): Payer: 59 | Admitting: Cardiology

## 2013-04-09 VITALS — BP 110/66 | HR 68 | Ht 74.0 in | Wt 229.0 lb

## 2013-04-09 DIAGNOSIS — E785 Hyperlipidemia, unspecified: Secondary | ICD-10-CM

## 2013-04-09 DIAGNOSIS — I2510 Atherosclerotic heart disease of native coronary artery without angina pectoris: Secondary | ICD-10-CM

## 2013-04-09 NOTE — Patient Instructions (Signed)
Your physician recommends that you return for a FASTING lipid profile/liver profile in 2 months.   Take coenzyme Q10 200mg  daily.   Your physician wants you to follow-up in: 4 months with Dr Shirlee Latch. (May 2014).  You will receive a reminder letter in the mail two months in advance. If you don't receive a letter, please call our office to schedule the follow-up appointment.

## 2013-04-10 ENCOUNTER — Encounter: Payer: Self-pay | Admitting: Cardiology

## 2013-04-10 NOTE — Addendum Note (Signed)
Addended by: Jacqlyn Krauss on: 04/10/2013 08:46 AM   Modules accepted: Orders

## 2013-04-10 NOTE — Progress Notes (Signed)
Patient ID: Derrick Todd, male   DOB: August 02, 1960, 53 y.o.   MRN: [id number] PCP: Dr. Lovell SheehanJenkins  53 yo with history of CAD s/p NSTEMI in 5/13 and inferior STEMI in 12/14 presents for followup.  In 5/13, he had an NSTEMI with BMS to totally occluded distal RCA.  In 12/14, he had an inferior STEMI with 95% mid RCA (distal RCA stent patent) at [facility].  [facility].  Echo (12/14) showed EF 50-55%.  He has done well since his MI.  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 He has had no further chest pain and no exertional dyspnea.  He is going to be unable to do cardiac rehab because of his work schedule.  He walks on a treadmill for 35 minutes 3 times a week.  He quit smoking in 7/14.  He had myalgias with atorvastatin and was actually off his statin when he had the MI.  He has since started Crestor and has not had any problems with it so far.    Labs (5/13): LDL 122, HDL 32 Labs (6/13): K 3.9, creatinine 1.0 Labs (12/14): K 3.8, creatinine 1.0, LDL 153  PMH: 1. CAD: NSTEMI 5/13 with LHC showing 70% OM, 70% mLAD, total occlusion distal RCA, EF 55%.  Had 2 x 18 BMS to distal RCA.  Echo (5/13): EF 55-60% with basal inferior hypokinesis.  Inferior STEMI (12/14).  LHC showed 60% mLAD, 60-70% dLCx, 95% mRCA, patent dRCA stent.  Patient had [facility] to [facility].   2. Hyperlipidemia: myalgias with atorvastatin.  3. 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  2 daughters.  Quit smoking in 7/14.   FH: CAD  ROS: All systems reviewed and negative except as per HPI.   Current Outpatient Prescriptions  Medication Sig Dispense Refill  . glipiZIDE (GLUCOTROL) 10 MG tablet Take 10 mg by mouth 2 (two) times daily before a meal.      . lisinopril (PRINIVIL,ZESTRIL) 5 MG tablet Take 1 tablet (5 mg total) by mouth daily.  30 tablet  6  . metoprolol tartrate (LOPRESSOR) 50 MG  tablet Take 1 tablet (50 mg total) by mouth 2 (two) times daily.  180 tablet  3  . nitroGLYCERIN (NITROSTAT) 0.4 MG SL tablet Place 1 tablet (0.4 mg total) under the tongue every 5 (five) minutes x 3 doses as needed for chest pain.  25 tablet  4  . Omega-3 Fatty Acids (FISH OIL) 1200 MG CAPS Take by mouth daily.      . pantoprazole (PROTONIX) 40 MG tablet Take 1 tablet (40 mg total) by mouth daily.  90 tablet  3  . prasugrel (EFFIENT) 10 MG TABS Take 1 tablet (10 mg total) by mouth daily.  30 tablet  11  . rosuvastatin (CRESTOR) 20 MG tablet Take 1 tablet (20 mg total) by mouth daily.  90 tablet  3  . aspirin 81 MG EC tablet Take 1 tablet (81 mg total) by mouth daily.  30 tablet  6  . aspirin EC 81 MG tablet Take 1 tablet (81 mg total) by mouth daily.      . Coenzyme Q10 200 MG capsule Take 1 capsule (200 mg total) by mouth daily.       No current facility-administered medications for this visit.    BP 110/66  Pulse 68  Ht 6\' 2"  (1.88 m)  Wt 103.874 kg (229 lb)  BMI 29.39 kg/m2 General: NAD Neck: No JVD, no thyromegaly or thyroid nodule.  Lungs: Clear to auscultation bilaterally with normal respiratory effort. CV: Nondisplaced PMI.  Heart regular S1/S2, +S4, no murmur.  No peripheral edema.  No carotid bruit.  Normal pedal pulses.  Abdomen: Soft, nontender, no hepatosplenomegaly, no distention.  Neurologic: Alert and oriented x 3.  Psych: Normal affect. Extremities: No clubbing or cyanosis.   Assessment/Plan: 1. CAD: Stable with no ischemic symptoms since inferior MI in 12/14.  He is back at work without difficulty.  Continue ASA 81, statin, lisinopril, metoprolol, Effient.  He will be able to stop Effient after 1 year.  At that point, I will start him on Plavix for long-term use.  I want him to increase his exercise regimen to at least 5 days a week. He has residual disease in the LAD and dLCx, but the description sounds similar to our prior cath at [facility].  I will try to get the  cath films from [place] to review.  In the absence of ischemic symptoms, I am not going to have him do a Cardiolite.  2. Hyperlipidemia: He had been off atorvastatin at time of MI due to myalgias.  I have started him on Crestor and he seems to be tolerating it.  I will have him start coenzyme Q10 200 mg daily and will get lipids/LFTs in 2 months.   Marca Ancona 04/10/2013

## 2013-04-11 ENCOUNTER — Other Ambulatory Visit (HOSPITAL_COMMUNITY): Payer: Self-pay | Admitting: Physician Assistant

## 2013-04-11 ENCOUNTER — Other Ambulatory Visit: Payer: Self-pay | Admitting: Cardiology

## 2013-04-12 ENCOUNTER — Telehealth: Payer: Self-pay | Admitting: *Deleted

## 2013-04-12 DIAGNOSIS — E785 Hyperlipidemia, unspecified: Secondary | ICD-10-CM

## 2013-04-12 NOTE — Telephone Encounter (Signed)
s/w pt's wife about lab results, she was also given the lipid panel #'s, Wife is aware pt needs to repeat FLP/LFT in 2 months and will have ptcb to make appt. I said that was fine.

## 2013-06-27 ENCOUNTER — Ambulatory Visit (INDEPENDENT_AMBULATORY_CARE_PROVIDER_SITE_OTHER): Payer: 59 | Admitting: *Deleted

## 2013-06-27 DIAGNOSIS — I2510 Atherosclerotic heart disease of native coronary artery without angina pectoris: Secondary | ICD-10-CM

## 2013-06-27 DIAGNOSIS — E785 Hyperlipidemia, unspecified: Secondary | ICD-10-CM

## 2013-06-27 LAB — LIPID PANEL
Cholesterol: 155 mg/dL (ref 0–200)
HDL: 42.8 mg/dL (ref >39.00)
LDL Cholesterol: 88 mg/dL (ref 0–99)
TRIGLYCERIDES: 119 mg/dL (ref 0.0–149.0)
Total CHOL/HDL Ratio: 4
VLDL: 23.8 mg/dL (ref 0.0–40.0)

## 2013-06-27 LAB — HEPATIC FUNCTION PANEL
ALT: 23 U/L (ref 0–53)
AST: 14 U/L (ref 0–37)
Albumin: 4.2 g/dL (ref 3.5–5.2)
Alkaline Phosphatase: 52 U/L (ref 39–117)
BILIRUBIN DIRECT: 0 mg/dL (ref 0.0–0.3)
Total Bilirubin: 0.8 mg/dL (ref 0.3–1.2)
Total Protein: 7.3 g/dL (ref 6.0–8.3)

## 2013-06-27 LAB — HEMOGLOBIN A1C: Hgb A1c MFr Bld: 9.2 % — ABNORMAL HIGH (ref 4.6–6.5)

## 2013-07-08 ENCOUNTER — Telehealth: Payer: Self-pay | Admitting: Cardiology

## 2013-07-08 NOTE — Telephone Encounter (Signed)
Spoke with patient about lab results.

## 2013-07-08 NOTE — Telephone Encounter (Signed)
New message   Patient calling stating some call him today urgently to return call back to office.

## 2013-08-28 ENCOUNTER — Ambulatory Visit (INDEPENDENT_AMBULATORY_CARE_PROVIDER_SITE_OTHER): Payer: 59 | Admitting: Cardiology

## 2013-08-28 ENCOUNTER — Telehealth: Payer: Self-pay | Admitting: *Deleted

## 2013-08-28 ENCOUNTER — Other Ambulatory Visit: Payer: Self-pay | Admitting: *Deleted

## 2013-08-28 ENCOUNTER — Encounter (INDEPENDENT_AMBULATORY_CARE_PROVIDER_SITE_OTHER): Payer: Self-pay

## 2013-08-28 ENCOUNTER — Encounter: Payer: Self-pay | Admitting: Cardiology

## 2013-08-28 ENCOUNTER — Ambulatory Visit (INDEPENDENT_AMBULATORY_CARE_PROVIDER_SITE_OTHER)
Admission: RE | Admit: 2013-08-28 | Discharge: 2013-08-28 | Disposition: A | Discharge status: Home / Self Care | Payer: 59 | Source: Ambulatory Visit | Attending: Cardiology | Admitting: Cardiology

## 2013-08-28 ENCOUNTER — Encounter: Payer: Self-pay | Admitting: *Deleted

## 2013-08-28 VITALS — BP 120/64 | HR 68 | Ht 74.0 in | Wt 246.0 lb

## 2013-08-28 DIAGNOSIS — I2510 Atherosclerotic heart disease of native coronary artery without angina pectoris: Secondary | ICD-10-CM

## 2013-08-28 DIAGNOSIS — I5032 Chronic diastolic (congestive) heart failure: Secondary | ICD-10-CM

## 2013-08-28 DIAGNOSIS — I509 Heart failure, unspecified: Secondary | ICD-10-CM

## 2013-08-28 DIAGNOSIS — E785 Hyperlipidemia, unspecified: Secondary | ICD-10-CM

## 2013-08-28 DIAGNOSIS — R0602 Shortness of breath: Secondary | ICD-10-CM

## 2013-08-28 MED ORDER — POTASSIUM CHLORIDE CRYS ER 10 MEQ PO TBCR: 10.0000 meq | EXTENDED_RELEASE_TABLET | Freq: Every day | ORAL | Status: DC

## 2013-08-28 MED ORDER — LISINOPRIL 5 MG PO TABS: ORAL_TABLET | ORAL | Status: DC

## 2013-08-28 MED ORDER — FUROSEMIDE 20 MG PO TABS: 20.0000 mg | ORAL_TABLET | Freq: Every day | ORAL | Status: DC

## 2013-08-28 MED ORDER — PANTOPRAZOLE SODIUM 40 MG PO TBEC: DELAYED_RELEASE_TABLET | ORAL | Status: DC
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
## 2013-08-28 NOTE — Patient Instructions (Addendum)
Start lasix (furosemide) 20mg  daily.   Start KCL (potassium) 10 mEq daily.  Your physician recommends that you return for lab work in: about 10 days--BMET.   A chest x-ray takes a picture of the organs and structures inside the chest, including the heart, lungs, and blood vessels. This test can show several things, including, whether the heart is enlarges; whether fluid is building up in the lungs; and whether pacemaker / defibrillator leads are still in place.   Your physician has requested that you have an echocardiogram. Echocardiography is a painless test that uses sound waves to create images of your heart. It provides your doctor with information about the size and shape of your heart and how well your heart's chambers and valves are working. This procedure takes approximately one hour. There are no restrictions for this procedure.   Your physician recommends that you schedule a follow-up appointment in: 1 month with Dr Shirlee Latch.

## 2013-08-28 NOTE — Telephone Encounter (Signed)
Can refill once, PCP needs to refill afterwards

## 2013-08-28 NOTE — Telephone Encounter (Signed)
Patient requests glipizide refill sent to [facility] outpatient pharmacy, but I do not see where Dr Shirlee Latch has refilled this for him. Please advise. Thanks, MI

## 2013-08-28 NOTE — Telephone Encounter (Signed)
Dr Shirlee Latch can we refill this for him?

## 2013-08-29 ENCOUNTER — Telehealth: Payer: Self-pay | Admitting: Cardiology

## 2013-08-29 MED ORDER — GLIPIZIDE 10 MG PO TABS: 10.0000 mg | ORAL_TABLET | Freq: Two times a day (BID) | ORAL | Status: DC

## 2013-08-29 NOTE — Telephone Encounter (Signed)
New message ° ° ° ° ° °Returning a nurses call °

## 2013-08-29 NOTE — Telephone Encounter (Signed)
LM with recent chest xray results.

## 2013-09-03 ENCOUNTER — Other Ambulatory Visit (HOSPITAL_COMMUNITY): Payer: Self-pay | Admitting: Cardiology

## 2013-09-10 ENCOUNTER — Other Ambulatory Visit: Payer: 59

## 2013-09-16 ENCOUNTER — Other Ambulatory Visit (HOSPITAL_COMMUNITY): Payer: 59

## 2013-09-23 ENCOUNTER — Encounter (HOSPITAL_COMMUNITY): Payer: Self-pay | Admitting: Cardiology

## 2013-09-26 ENCOUNTER — Ambulatory Visit (HOSPITAL_COMMUNITY): Payer: 59

## 2013-09-27 ENCOUNTER — Ambulatory Visit (HOSPITAL_COMMUNITY): Payer: 59 | Attending: Internal Medicine | Admitting: Radiology

## 2013-09-27 DIAGNOSIS — I509 Heart failure, unspecified: Secondary | ICD-10-CM | POA: Insufficient documentation

## 2013-09-27 DIAGNOSIS — I2510 Atherosclerotic heart disease of native coronary artery without angina pectoris: Secondary | ICD-10-CM | POA: Insufficient documentation

## 2013-09-27 DIAGNOSIS — I209 Angina pectoris, unspecified: Secondary | ICD-10-CM | POA: Insufficient documentation

## 2013-09-27 DIAGNOSIS — I5032 Chronic diastolic (congestive) heart failure: Secondary | ICD-10-CM | POA: Insufficient documentation

## 2013-09-27 DIAGNOSIS — R609 Edema, unspecified: Secondary | ICD-10-CM

## 2013-09-27 DIAGNOSIS — R0602 Shortness of breath: Secondary | ICD-10-CM | POA: Insufficient documentation

## 2013-09-27 DIAGNOSIS — I25119 Atherosclerotic heart disease of native coronary artery with unspecified angina pectoris: Secondary | ICD-10-CM

## 2013-09-27 NOTE — Progress Notes (Signed)
Echocardiogram performed.  

## 2013-10-01 ENCOUNTER — Encounter: Payer: Self-pay | Admitting: Cardiology

## 2013-10-01 ENCOUNTER — Ambulatory Visit (INDEPENDENT_AMBULATORY_CARE_PROVIDER_SITE_OTHER): Payer: 59 | Admitting: Cardiology

## 2013-10-01 VITALS — BP 118/70 | HR 85 | Ht 74.0 in | Wt 237.0 lb

## 2013-10-01 DIAGNOSIS — E785 Hyperlipidemia, unspecified: Secondary | ICD-10-CM

## 2013-10-01 DIAGNOSIS — I509 Heart failure, unspecified: Secondary | ICD-10-CM

## 2013-10-01 DIAGNOSIS — I2510 Atherosclerotic heart disease of native coronary artery without angina pectoris: Secondary | ICD-10-CM

## 2013-10-01 DIAGNOSIS — I5032 Chronic diastolic (congestive) heart failure: Secondary | ICD-10-CM

## 2013-10-01 DIAGNOSIS — R0602 Shortness of breath: Secondary | ICD-10-CM

## 2013-10-01 LAB — BASIC METABOLIC PANEL WITH GFR
BUN: 18 mg/dL (ref 6–23)
CO2: 22 meq/L (ref 19–32)
Calcium: 9.4 mg/dL (ref 8.4–10.5)
Chloride: 105 meq/L (ref 96–112)
Creatinine, Ser: 1 mg/dL (ref 0.4–1.5)
GFR: 96.53 mL/min
Glucose, Bld: 179 mg/dL — ABNORMAL HIGH (ref 70–99)
Potassium: 4 meq/L (ref 3.5–5.1)
Sodium: 136 meq/L (ref 135–145)

## 2013-10-01 NOTE — Patient Instructions (Signed)
Your physician recommends that you return for lab work today--BMET  Your physician wants you to follow-up in: 6 months with Dr Shirlee Latch. (December 2015).  You will receive a reminder letter in the mail two months in advance. If you don't receive a letter, please call our office to schedule the follow-up appointment.
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
## 2013-10-21 ENCOUNTER — Telehealth: Payer: Self-pay | Admitting: Cardiology

## 2013-10-21 NOTE — Telephone Encounter (Signed)
New message  ° ° °Patient calling back to speak with nurse from Friday.   °

## 2013-10-21 NOTE — Telephone Encounter (Signed)
Spoke with patient about lab results.

## 2013-11-27 ENCOUNTER — Other Ambulatory Visit: Payer: Self-pay | Admitting: *Deleted

## 2013-11-27 ENCOUNTER — Other Ambulatory Visit: Payer: Self-pay

## 2013-11-27 MED ORDER — LISINOPRIL 5 MG PO TABS: ORAL_TABLET | ORAL | Status: DC

## 2013-11-27 MED ORDER — PANTOPRAZOLE SODIUM 40 MG PO TBEC: DELAYED_RELEASE_TABLET | ORAL | Status: DC

## 2014-02-21 ENCOUNTER — Other Ambulatory Visit: Payer: Self-pay | Admitting: Cardiology

## 2014-03-13 ENCOUNTER — Encounter (HOSPITAL_COMMUNITY): Payer: Self-pay | Admitting: Cardiology

## 2014-05-15 ENCOUNTER — Encounter: Payer: Self-pay | Admitting: Cardiology

## 2014-05-15 ENCOUNTER — Ambulatory Visit (INDEPENDENT_AMBULATORY_CARE_PROVIDER_SITE_OTHER): Payer: 59 | Admitting: Cardiology

## 2014-05-15 ENCOUNTER — Ambulatory Visit: Payer: 59 | Admitting: Cardiology

## 2014-05-15 VITALS — BP 118/74 | HR 93 | Ht 74.0 in | Wt 237.0 lb

## 2014-05-15 DIAGNOSIS — E785 Hyperlipidemia, unspecified: Secondary | ICD-10-CM

## 2014-05-15 DIAGNOSIS — Z720 Tobacco use: Secondary | ICD-10-CM

## 2014-05-15 DIAGNOSIS — I2510 Atherosclerotic heart disease of native coronary artery without angina pectoris: Secondary | ICD-10-CM

## 2014-05-15 LAB — LIPID PANEL
CHOL/HDL RATIO: 6
Cholesterol: 239 mg/dL — ABNORMAL HIGH (ref 0–200)
HDL: 40 mg/dL (ref >39.00)
NonHDL: 199
Triglycerides: 267 mg/dL — ABNORMAL HIGH (ref 0.0–149.0)
VLDL: 53.4 mg/dL — ABNORMAL HIGH (ref 0.0–40.0)

## 2014-05-15 LAB — HEPATIC FUNCTION PANEL
ALBUMIN: 4.1 g/dL (ref 3.5–5.2)
ALT: 18 U/L (ref 0–53)
AST: 12 U/L (ref 0–37)
Alkaline Phosphatase: 76 U/L (ref 39–117)
Bilirubin, Direct: 0 mg/dL (ref 0.0–0.3)
Total Bilirubin: 0.5 mg/dL (ref 0.2–1.2)
Total Protein: 7.1 g/dL (ref 6.0–8.3)

## 2014-05-15 LAB — BASIC METABOLIC PANEL
BUN: 18 mg/dL (ref 6–23)
CALCIUM: 9.3 mg/dL (ref 8.4–10.5)
CO2: 26 meq/L (ref 19–32)
Chloride: 101 mEq/L (ref 96–112)
Creatinine, Ser: 1.1 mg/dL (ref 0.40–1.50)
GFR: 89.27 mL/min (ref >60.00)
Glucose, Bld: 265 mg/dL — ABNORMAL HIGH (ref 70–99)
Potassium: 3.9 mEq/L (ref 3.5–5.1)
SODIUM: 133 meq/L — AB (ref 135–145)

## 2014-05-15 LAB — HEMOGLOBIN A1C: Hgb A1c MFr Bld: 9.9 % — ABNORMAL HIGH (ref 4.6–6.5)

## 2014-05-15 LAB — LDL CHOLESTEROL, DIRECT: Direct LDL: 155 mg/dL

## 2014-05-15 MED ORDER — CLOPIDOGREL BISULFATE 75 MG PO TABS: 75.0000 mg | ORAL_TABLET | Freq: Every day | ORAL | Status: DC

## 2014-05-15 MED ORDER — PITAVASTATIN CALCIUM 2 MG PO TABS: 2.0000 mg | ORAL_TABLET | Freq: Every day | ORAL | Status: DC

## 2014-05-15 MED ORDER — SILDENAFIL CITRATE 50 MG PO TABS: 50.0000 mg | ORAL_TABLET | Freq: Every day | ORAL | Status: DC | PRN

## 2014-05-15 NOTE — Patient Instructions (Signed)
Stop Effient.  Start Plavix 75mg  daily instead of Effient.   Stop Crestor.  Start Livalo 2mg  daily.  Dr Shirlee Latch has given you a prescription for Viagra 50mg  as needed for ED. DO NOT TAKE NITROGLYCERIN WITHIN 24 HOURS OF TAKING VIAGRA.  Your physician recommends that you return for lab work today--Lipid profile/liver profile/BMET/HGB A1C.  Your physician recommends that you return for a FASTING lipid profile /liver profile 2 months after you start Livalo.   Your physician wants you to follow-up in: 6 months with Dr Shirlee Latch. (August 2016). You will receive a reminder letter in the mail two months in advance. If you don't receive a letter, please call our office to schedule the follow-up appointment.

## 2014-05-16 NOTE — Progress Notes (Signed)
Patient ID: Derrick Todd, male   DOB: 04-15-1960, 54 y.o.   MRN: [id number] PCP: Dr. Della Goo  54 yo with history of CAD s/p NSTEMI in 5/13 and inferior STEMI in 12/14 presents for followup.  In 5/13, he had an NSTEMI with BMS to totally occluded distal RCA.  In 12/14, he had an inferior STEMI with 95% mid RCA (distal RCA stent patent) at [facility].  He had BMS to [facility].  Echo (12/14) showed EF 50-55%.  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  He quit smoking in 7/14.  Echo (6/15) showed EF 55-60% with basal to mid inferolateral hypokinesis (no significant change from prior).   He had myalgias with atorvastatin and was actually off his statin when he had the 12/14 MI.  He has since started Crestor but now reports some myalgias with Crestor, not as bad as with atorvastatin.  He says that he is now taking the Crestor approximately every other day. 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 He has been doing well with no exertional dyspnea or chest pain.  He is not following up regularly with anyone for diabetes management.     ILabs (5/13): LDL 122, HDL 32 Labs (6/13): K 3.9, creatinine 1.0 Labs (12/14): K 3.8, creatinine 1.0, LDL 153 Labs (3/15): LDL 88, HDL 43, hgbA1c 9.2 Labs (6/15): K 4, creatinine 1.0  PMH: 1. CAD: NSTEMI 5/13 with LHC showing 70% OM, 70% mLAD, total occlusion distal RCA, EF 55%.  Had 2 x 18 BMS to distal RCA.  Echo (5/13): EF 55-60% with basal inferior hypokinesis.  Inferior STEMI (12/14).  LHC showed 60% mLAD, 60-70% dLCx, 95% mRCA, patent dRCA stent.  Patient had [facility]MS to [facility].  Echo (12/14) with EF 50-55% [facility]).  Echo (6/15) with EF 55-60%, basal to mid inferolateral hypokinesis.  2. Hyperlipidemia: myalgias with atorvastatin. Myalgias with Crestor but more manageable.  3. Type II diabetes 4. Erectile dysfunction  SH: Works in [employer] at [employer] and at [employer] Banker).  Wife is  nurse working for [employer].  2 daughters.  Quit smoking in 7/14.   FH: CAD  ROS: All systems reviewed and negative except as per HPI.   Current Outpatient Prescriptions  Medication Sig Dispense Refill  . ASPIR-LOW 81 MG EC tablet TAKE 1 TABLET (81 MG TOTAL) BY MOUTH DAILY. 30 tablet 1  . clopidogrel (PLAVIX) 75 MG tablet Take 1 tablet (75 mg total) by mouth daily. 90 tablet 1  . Coenzyme Q10 200 MG capsule Take 1 capsule (200 mg total) by mouth daily.    . furosemide (LASIX) 20 MG tablet One tablet daily as needed    . glipiZIDE (GLUCOTROL) 10 MG tablet Take 1 tablet (10 mg total) by mouth 2 (two) times daily before a meal. 60 tablet 0  . lisinopril (PRINIVIL,ZESTRIL) 5 MG tablet TAKE 1 TABLET (5 MG TOTAL) BY MOUTH DAILY. 90 tablet 0  . metoprolol tartrate (LOPRESSOR) 50 MG tablet Take 1 tablet (50 mg total) by mouth 2 (two) times daily. 180 tablet 3  . nitroGLYCERIN (NITROSTAT) 0.4 MG SL tablet Place 1 tablet (0.4 mg total) under the tongue every 5 (five) minutes x 3 doses as needed for chest pain. 25 tablet 4  . Omega-3 Fatty Acids (FISH OIL) 1200 MG CAPS Take by mouth daily. TAKE 1000 AND KRILL OIL 800    . pantoprazole (PROTONIX) 40  MG tablet TAKE 1 TABLET BY MOUTH DAILY. 90 tablet 0  . Pitavastatin Calcium 2 MG TABS Take 1 tablet (2 mg total) by mouth daily. 30 tablet 3  . potassium chloride (K-DUR,KLOR-CON) 10 MEQ tablet One tablet daily as needed    . sildenafil (VIAGRA) 50 MG tablet Take 1 tablet (50 mg total) by mouth daily as needed for erectile dysfunction. DO NOT TAKE NITROGLYCERIN WITHIN 24 HOURS OF TAKING VIAGRA 15 tablet 1   No current facility-administered medications for this visit.    BP 118/74 mmHg  Pulse 93  Ht 6\' 2"  (1.88 m)  Wt 237 lb (107.502 kg)  BMI 30.42 kg/m2  SpO2 95% General: NAD Neck: JVP 7 cm, no thyromegaly or thyroid nodule.  Lungs: Clear to auscultation bilaterally with normal respiratory effort. CV: Nondisplaced PMI.  Heart regular S1/S2,  +S4, no murmur.  No edema.  No carotid bruit.  Normal pedal pulses.  Abdomen: Soft, nontender, no hepatosplenomegaly, no distention.  Neurologic: Alert and oriented x 3.  Psych: Normal affect. Extremities: No clubbing or cyanosis.   Assessment/Plan: 1. CAD: Stable with no ischemic symptoms since inferior MI in 12/14.  He is back at work.   - Continue ASA 81, statin, lisinopril, metoprolol.   - He has completed 1 year of Effient.  He can stop Effient and start on Plavix 75 mg daily.  I think that he should continue Plavix long-term as long as he has no bleeding side effects (DAPT score = 4). 2. Hyperlipidemia:  He has been cutting back on Crestor to 20 mg every other day due to myalgias.  he did not tolerate atorvastatin.  He takes coenzyme Q10.  - I will have him try Livalo 2 mg daily to see if he can take this without myalgias.  Lipids/LFTs in 2 months.  3. Erectile dysfunction: He can take Viagra but was cautioned about the risk of hypotension if NTG is taken while Viagra is in the system.  4. Diabetes: Not getting followup for this, has no regular PCP.  I will check hemoglobin A1c today.  It is imperative for him to establish a PCP.  We discussed this.   Followup in 6 months.   Marca Ancona 05/16/2014

## 2014-05-20 ENCOUNTER — Telehealth: Payer: Self-pay | Admitting: Cardiology

## 2014-05-20 DIAGNOSIS — Z720 Tobacco use: Secondary | ICD-10-CM

## 2014-05-20 DIAGNOSIS — I2510 Atherosclerotic heart disease of native coronary artery without angina pectoris: Secondary | ICD-10-CM

## 2014-05-20 DIAGNOSIS — E785 Hyperlipidemia, unspecified: Secondary | ICD-10-CM

## 2014-05-20 MED ORDER — PITAVASTATIN CALCIUM 2 MG PO TABS: 2.0000 mg | ORAL_TABLET | Freq: Every day | ORAL | Status: DC

## 2014-05-20 NOTE — Telephone Encounter (Signed)
Follow Up        Returning phone call from [place]. Please call back.

## 2014-05-20 NOTE — Telephone Encounter (Signed)
Nonfasting state is not going to have appreciable effect on the LDL (reallyonly effects the triglycerides acutely), so whether or not he had a meal prior his LDL is still going to be too high.  He can recheck it fasting if he wants, but sounds like we need to stop Crestor regardless so may as well just wait to recheck it.  Have him stop Crestor for 1 week then after that week start the Livalo 2 mg daily that he was given with lipids/LFTs in 2 months.

## 2014-05-20 NOTE — Telephone Encounter (Signed)
Called the pt to notify him of his recent lab results per Dr Shirlee Latch.  Informed the pt that his hemoglobin A1C read high at 9.9 and he will need to follow-up with his PCP.  Informed the pt that per Dr Shirlee Latch his LDL way too high.  Asked the pt that per Dr Shirlee Latch is he currently taking the Crestor every other day?  Informed the pt that per Dr Shirlee Latch he needs to get on Livalo, and if his insurance company won't let him have it, try to take Crestor 20 every other day + Zetia 10 mg daily, and he will need a  repeat lipids/LFTs in 2 months.  Per the pt, he states that he ate a large meal before having his labs drawn, for he states he was not made aware that he needed to be fasting.  Pt states he worked 3rd shift the night before, then came home and had himself a large breakfast, and then came to have his labs drawn.  Pt also states that he is taking his Crestor as prescribed and currently has Livalo on hand, but has not started this regimen yet.  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  Pt requesting that Dr Shirlee Latch put in another order for him to come in and have his labs redrawn, with awareness of needing to be fasting.  Pt also requesting that Dr Shirlee Latch as advise the pt on a different regimen then Crestor, d/t the muscle aches and his occupation.  Informed the pt that I will route this message to Dr Shirlee Latch for further review and recommendation on orders, and someone will follow-up thereafter.  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

## 2014-05-20 NOTE — Telephone Encounter (Signed)
Contacted the pt to inform him that per Dr Shirlee Latch the nonfasting state is not going to affect his LDL, but rather his triglycerides.  Informed the pt that per Dr Shirlee Latch he would like for him to stop taking his Crestor for one week (d/t the lower extremity muscle aches), and start Livalo 2 mg that was given to him starting next Tuesday.  Informed the pt that Dr Shirlee Latch would like for him to come in for fasting lipids and LFTs in 2 months.  Scheduled the pt a lab appt for 07/21/14 at our office, and instructed him to come fasting.  Advised the pt that if his muscle aches continue with the Crestor discontinued for one week, then he should notify Dr Alford Highland nurse of this. Pt verbalized understanding and agrees with this plan.

## 2014-06-12 ENCOUNTER — Other Ambulatory Visit: Payer: Self-pay | Admitting: Cardiology

## 2014-06-13 ENCOUNTER — Other Ambulatory Visit: Payer: Self-pay

## 2014-06-13 ENCOUNTER — Telehealth: Payer: Self-pay | Admitting: Cardiology

## 2014-06-13 MED ORDER — PANTOPRAZOLE SODIUM 40 MG PO TBEC: DELAYED_RELEASE_TABLET | ORAL | Status: DC

## 2014-06-13 MED ORDER — LISINOPRIL 5 MG PO TABS: ORAL_TABLET | ORAL | Status: DC

## 2014-06-13 NOTE — Telephone Encounter (Signed)
Pt states he has a constant ache in both thighs about the knees starting yesterday, pt states this is similar to Crestor but more intense.  Pt states he is willing to try Crestor again if that is Dr Alford Highland recommendation.   Pt advised I will forward to Dr Shirlee Latch for review.

## 2014-06-13 NOTE — Telephone Encounter (Signed)
Let's have him go back to prior dose of Crestor.

## 2014-06-13 NOTE — Telephone Encounter (Signed)
LMTCB

## 2014-06-13 NOTE — Telephone Encounter (Signed)
New Msg        Pt calling back Levello more leg pain than Crestor.  As of today pt will not take anymore and will resume taking Crestor.   Is this okay?   Please call.

## 2014-06-16 NOTE — Telephone Encounter (Signed)
LMTCB

## 2014-06-20 NOTE — Telephone Encounter (Signed)
LMTCB

## 2014-06-23 NOTE — Telephone Encounter (Signed)
Pt states he will restart Crestor 10mg  daily, pt will increase to Crestor 20mg  daily after 10-14 days if he tolerates 10mg  without side effects.  Pt confirmed that he is taking coenzyme Q10 200mg  daily also.

## 2014-06-24 NOTE — Telephone Encounter (Signed)
ok 

## 2014-07-15 ENCOUNTER — Telehealth: Payer: Self-pay | Admitting: Cardiology

## 2014-07-15 NOTE — Telephone Encounter (Signed)
Called to make pt aware FMLA is signed and ready for pick up I could not leave message phone stated " could not leave message at this time"

## 2014-07-21 ENCOUNTER — Other Ambulatory Visit: Payer: 59

## 2014-08-15 ENCOUNTER — Telehealth: Payer: Self-pay | Admitting: *Deleted

## 2014-08-15 NOTE — Telephone Encounter (Signed)
-----   Message from Marcelle Smiling Mesiemore sent at 08/13/2014 12:22 PM EDT ----- Contact: [phone] Anne,   Patient called in stating #'s 5,6,&7 on his FMLA need to be completed In order for them to accept the [facility] paper. He is needing a call from you ASAP and these forms completed ASAP.   Thanks, Selena Batten

## 2014-08-15 NOTE — Telephone Encounter (Signed)
Pt advised I will ask Dr Shirlee Latch to review FMLA on 08/21/14.

## 2014-08-21 NOTE — Telephone Encounter (Signed)
Dr Shirlee Latch completed questions 5,6 and 7.  I attempted to contact pt at cell number he left [phone]. When I called this number I received a voice mail message that the mailbox was full and I could not leave a message.   I returned the completed form to HIM, Kim's desk with a message asking her to call Friday to let pt know form had been completed.

## 2014-08-22 ENCOUNTER — Telehealth: Payer: Self-pay | Admitting: Cardiology

## 2014-08-22 NOTE — Telephone Encounter (Signed)
Called pt earlier and phone was full called patient back and spoke with him to let him know that his FMLA form is ready to be picked up will pick up 08/22/14.  ltd

## 2014-09-24 ENCOUNTER — Other Ambulatory Visit: Payer: Self-pay | Admitting: Cardiology

## 2014-09-24 ENCOUNTER — Other Ambulatory Visit: Payer: Self-pay | Admitting: Physician Assistant

## 2014-11-11 ENCOUNTER — Ambulatory Visit: Payer: 59 | Admitting: Nurse Practitioner

## 2014-11-14 ENCOUNTER — Encounter: Payer: Self-pay | Admitting: Nurse Practitioner

## 2014-11-14 ENCOUNTER — Ambulatory Visit (INDEPENDENT_AMBULATORY_CARE_PROVIDER_SITE_OTHER): Payer: 59 | Admitting: Nurse Practitioner

## 2014-11-14 VITALS — BP 130/80 | HR 89 | Ht 74.0 in | Wt 238.0 lb

## 2014-11-14 DIAGNOSIS — E785 Hyperlipidemia, unspecified: Secondary | ICD-10-CM

## 2014-11-14 DIAGNOSIS — Z720 Tobacco use: Secondary | ICD-10-CM

## 2014-11-14 DIAGNOSIS — I2510 Atherosclerotic heart disease of native coronary artery without angina pectoris: Secondary | ICD-10-CM

## 2014-11-14 DIAGNOSIS — I5032 Chronic diastolic (congestive) heart failure: Secondary | ICD-10-CM

## 2014-11-14 MED ORDER — NITROGLYCERIN 0.4 MG SL SUBL: 0.4000 mg | SUBLINGUAL_TABLET | SUBLINGUAL | Status: DC | PRN

## 2014-11-14 MED ORDER — CLOPIDOGREL BISULFATE 75 MG PO TABS
75.0000 mg | ORAL_TABLET | Freq: Every day | ORAL | Status: DC
Start: 2014-11-14 — End: 2015-09-28

## 2014-11-14 MED ORDER — METOPROLOL TARTRATE 50 MG PO TABS: ORAL_TABLET | ORAL | Status: DC

## 2014-11-14 MED ORDER — LISINOPRIL 5 MG PO TABS: ORAL_TABLET | ORAL | Status: DC

## 2014-11-14 MED ORDER — PANTOPRAZOLE SODIUM 40 MG PO TBEC: DELAYED_RELEASE_TABLET | ORAL | Status: DC

## 2014-11-14 NOTE — Patient Instructions (Addendum)
We will be checking the following labs today - NONE  Fasting labs on day of Myoview  Medication Instructions:    Continue with your current medicines.   I have sent your refills in    Testing/Procedures To Be Arranged:  Stress Myoview  Follow-Up:   See Dr. Shirlee Latch in 6 months    Other Special Instructions:   [name]  Call the [facility] office at [phone] if you have any questions, problems or concerns.

## 2014-11-14 NOTE — Progress Notes (Addendum)
CARDIOLOGY OFFICE NOTE  Date:  11/14/2014    [name] Date of Birth: 05/21/1960 Medical Record #[id number]  PCP:  Ron Parker, MD  Cardiologist:  Shirlee Latch    Chief Complaint  Patient presents with  . Coronary Artery Disease    6 month check - seen for Dr. Shirlee Latch    History of Present Illness: Derrick Todd is a 54 y.o. male who presents today for a follow up visit. Seen for Dr. Shirlee Latch. He has a history of CAD.  In 5/13, he had an NSTEMI with BMS to totally occluded distal RCA. In 12/14, he had an inferior STEMI with 95% mid RCA (distal RCA stent patent) at [facility]. He had BMS to [facility]. Echo (12/14) showed EF 50-55%. 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 He quit smoking in 7/14. Echo (6/15) showed EF 55-60% with basal to mid inferolateral hypokinesis (no significant change from prior).   He had myalgias with atorvastatin and was actually off his statin when he had the 12/14 MI. He is on Crestor QOD in order to tolerate.  PMH: 1. CAD: NSTEMI 5/13 with LHC showing 70% OM, 70% mLAD, total occlusion distal RCA, EF 55%. Had 2 x 18 BMS to distal RCA. Echo (5/13): EF 55-60% with basal inferior hypokinesis. Inferior STEMI (12/14). LHC showed 60% mLAD, 60-70% dLCx, 95% mRCA, patent dRCA stent. Patient had BMS to [facility]. Echo (12/14) with EF 50-55% [facility]). Echo (6/15) with EF 55-60%, basal to mid inferolateral hypokinesis.  2. Hyperlipidemia: myalgias with atorvastatin. Myalgias with Crestor but more manageable.  3. Type II diabetes 4. Erectile dysfunction  Last seen back in February - felt to be doing ok.   Comes back today. Here alone. Doing ok for the most part. Notes some ongoing fatigue that he has had since his MI's. Has had one spell of chest heaviness - this was about 5 weeks ago - happened at rest - no NTG use. Just faded and has not recurred. He walks his dog daily. Active in his yard. Continues to work as an Nutritional therapist.  Does "vape" some but not smoking.    Past Medical History  Diagnosis Date  . Dyslipidemia   . GERD (gastroesophageal reflux disease)   . Tobacco abuse   . DM2 (diabetes mellitus, type 2)   . Hx of echocardiogram     a. 2-D echocardiogram 09/02/11: Mild LVH, EF 55-60%, basal inferior HK, mild LAE, PASP 32.;   b. Echo (03/20/2013):  Mild TR, EF 50-55%, mild LVH.  Marland Kitchen CAD (coronary artery disease)     a. inferior STEMI 5/13:  LHC prox-mid LAD 20-30%, mid LAD 70%, pOM 70%, pRCA 20%, mid 40%, AM Br 50-60%, dRCA 70% then 100%.  EF 55%.  PCI:  BMS to the distal RCA.;  b. inf STEMI (03/2013 at [facility]): LHC -  mLAD 60%, dCFX 70-80, mRCA 95, dRCA stent ok.  PCI:  Vision (2.5 x 15 mm) BMS to the mRCA.    Past Surgical History  Procedure Laterality Date  . Tumor removal  2012  . Left heart catheterization with coronary angiogram [name] 09/02/2011    Procedure: LEFT HEART CATHETERIZATION WITH CORONARY ANGIOGRAM;  Surgeon: Herby Abraham, MD;  Location: [facility] CATH LAB;  Service: Cardiovascular;  Laterality: [name];  . Percutaneous coronary stent intervention (pci-s) Right 09/02/2011    Procedure: PERCUTANEOUS CORONARY STENT INTERVENTION (PCI-S);  Surgeon: Herby Abraham, MD;  Location: [facility] CATH LAB;  Service: Cardiovascular;  Laterality: Right;     Medications: Current Outpatient Prescriptions  Medication Sig Dispense Refill  . ASPIR-LOW 81 MG EC tablet TAKE 1 TABLET (81 MG TOTAL) BY MOUTH DAILY. 30 tablet 1  . clopidogrel (PLAVIX) 75 MG tablet Take 1 tablet (75 mg total) by mouth daily. 90 tablet 3  . Coenzyme Q10 200 MG capsule Take 1 capsule (200 mg total) by mouth daily.    . CRESTOR 20 MG tablet TAKE 1 TABLET (20 MG TOTAL) BY MOUTH DAILY. 90 tablet 1  . furosemide (LASIX) 20 MG tablet Take 20 mg by mouth as needed for edema (feet and leg swelling).    Marland Kitchen glipiZIDE (GLUCOTROL) 10 MG tablet Take 1 tablet (10 mg total) by mouth 2 (two) times daily before a meal. 60 tablet 0  . lisinopril  (PRINIVIL,ZESTRIL) 5 MG tablet TAKE 1 TABLET (5 MG TOTAL) BY MOUTH DAILY. 90 tablet 3  . metoprolol (LOPRESSOR) 50 MG tablet TAKE 1 TABLET (50 MG TOTAL) BY MOUTH 2 (TWO) TIMES DAILY. 180 tablet 3  . nitroGLYCERIN (NITROSTAT) 0.4 MG SL tablet Place 1 tablet (0.4 mg total) under the tongue every 5 (five) minutes x 3 doses as needed for chest pain. 25 tablet 4  . Omega-3 Fatty Acids (FISH OIL) 1200 MG CAPS Take by mouth daily. TAKE 1000 AND KRILL OIL 800    . pantoprazole (PROTONIX) 40 MG tablet TAKE 1 TABLET BY MOUTH DAILY. 90 tablet 3  . potassium chloride (K-DUR,KLOR-CON) 10 MEQ tablet One tablet daily as needed    . sildenafil (VIAGRA) 50 MG tablet Take 1 tablet (50 mg total) by mouth daily as needed for erectile dysfunction. DO NOT TAKE NITROGLYCERIN WITHIN 24 HOURS OF TAKING VIAGRA 15 tablet 1   No current facility-administered medications for this visit.    Allergies: Allergies  Allergen Reactions  . Livalo [Pitavastatin] Other (See Comments)    myalgias    Social History: The patient  reports that he has quit smoking. He has never used smokeless tobacco. He reports that he does not drink alcohol or use illicit drugs.   Family History: The patient's family history includes Depression in his mother; Diabetes in his brother and mother; Heart attack in his maternal grandfather; Heart failure in his maternal grandfather; Hypertension in his maternal grandfather and mother; Stroke in his mother.   Review of Systems: Please see the history of present illness.   Otherwise, the review of systems is positive for none.   All other systems are reviewed and negative.   Physical Exam: VS:  BP 130/80 mmHg  Pulse 89  Ht  (1.88 m)  Wt 238 lb (107.956 kg)  BMI 30.54 kg/m2  SpO2 97% .  BMI Body mass index is 30.54 kg/(m^2).  Wt Readings from Last 3 Encounters:  11/14/14 238 lb (107.956 kg)  05/15/14 237 lb (107.502 kg)  10/01/13 237 lb (107.502 kg)    General: Pleasant. Well  developed, well nourished and in no acute distress.  HEENT: Normal. Neck: Supple, no JVD, carotid bruits, or masses noted.  Cardiac: Regular rate and rhythm. No murmurs, rubs, or gallops. No edema.  Respiratory:  Lungs are clear to auscultation bilaterally with normal work of breathing.  GI: Soft and nontender.  MS: No deformity or atrophy. Gait and ROM intact. Skin: Warm and dry. Color is normal.  Neuro:  Strength and sensation are intact and no gross focal deficits noted.  Psych: Alert, appropriate and with normal affect.   LABORATORY  DATA:  EKG:  EKG is ordered today.   Lab Results  Component Value Date   WBC 7.2 09/03/2011   HGB 11.5* 09/03/2011   HCT 35.0* 09/03/2011   PLT 246 09/03/2011   GLUCOSE 265* 05/15/2014   CHOL 239* 05/15/2014   TRIG 267.0* 05/15/2014   HDL 40.00 05/15/2014   LDLDIRECT 155.0 05/15/2014   LDLCALC 88 06/27/2013   ALT 18 05/15/2014   AST 12 05/15/2014   NA 133* 05/15/2014   K 3.9 05/15/2014   CL 101 05/15/2014   CREATININE 1.10 05/15/2014   BUN 18 05/15/2014   CO2 26 05/15/2014   TSH 2.921 09/02/2011   INR 0.97 09/02/2011   HGBA1C 9.9* 05/15/2014    BNP (last 3 results) No results for input(s): BNP in the last 8760 hours.  ProBNP (last 3 results) No results for input(s): PROBNP in the last 8760 hours.   Other Studies Reviewed Today:   Assessment/Plan: 1. CAD with prior MIs and prior PCI to the RCA - has residual disease. Has done ok for the most part but one spell of chest pain that is a little worrisome. Will arrange for stress Myoview. I have refilled his medicines today. EKG today. He understands about not using NTG and Viagra. Tentatively see back in 6 months.   EKG today shows NSR, inferior Q's.  2. HTN  3. HLD - on statin therapy  4. DM - looks uncontrolled by last A1C  5. ED  Current medicines are reviewed with the patient today.  The patient does not have concerns regarding medicines other than what has been noted  above.  The following changes have been made:  See above.  Labs/ tests ordered today include:    Orders Placed This Encounter  Procedures  . Basic metabolic panel  . Hepatic function panel  . Lipid panel  . Myocardial Perfusion Imaging  . EKG 12-Lead     Disposition:   FU with Dr. Shirlee Latch in 6 months.   Patient is agreeable to this plan and will call if any problems develop in the interim.   Signed: Rosalio Macadamia, RN, ANP-C 11/14/2014 10:15 AM  [facility] [address] [place], [place]  [address] Phone: [phone] Fax: [phone]

## 2014-11-26 ENCOUNTER — Telehealth (HOSPITAL_COMMUNITY): Payer: Self-pay | Admitting: *Deleted

## 2014-11-26 NOTE — Telephone Encounter (Signed)
Left message on voicemail in reference to upcoming appointment scheduled for 11/28/14. Phone number given for a call back so details instructions can be given. Linsy Ehresman, Adelene Idler

## 2014-11-28 ENCOUNTER — Encounter (HOSPITAL_COMMUNITY): Payer: 59

## 2014-11-28 ENCOUNTER — Other Ambulatory Visit: Payer: 59

## 2015-04-06 MED FILL — glipiZIDE 10 MG TABS: 10 | 30 days supply | Qty: 60 | Fill #1

## 2015-06-04 ENCOUNTER — Encounter: Payer: 59 | Admitting: Physician Assistant

## 2015-06-04 NOTE — Progress Notes (Signed)
This encounter was created in error - please disregard.

## 2015-06-12 ENCOUNTER — Other Ambulatory Visit: Payer: Self-pay | Admitting: *Deleted

## 2015-06-12 MED ORDER — ROSUVASTATIN CALCIUM 20 MG PO TABS: ORAL_TABLET | ORAL | Status: DC

## 2015-06-12 MED FILL — FUROSEMIDE 20 MG TABLET: 20 | 30 days supply | Qty: 30 | Fill #1

## 2015-06-12 MED FILL — CLOPIDOGREL 75 MG TABLET: 75 | 90 days supply | Qty: 90 | Fill #1

## 2015-06-12 MED FILL — SULFAMETHOXAZOLE/TMP DS TAB: 800-160 | 7 days supply | Qty: 14 | Fill #0

## 2015-06-12 MED FILL — LISINOPRIL 5 MG TABLET: 5 | 90 days supply | Qty: 90 | Fill #3

## 2015-06-12 MED FILL — METOPROLOL TARTRATE 50 MG T: 50 | 90 days supply | Qty: 180 | Fill #0

## 2015-06-12 MED FILL — ROSUVASTATIN CALCIUM 20 MG: 20 | 90 days supply | Qty: 90 | Fill #0

## 2015-06-12 MED FILL — PANTOPRAZOLE SOD DR 40 MG T: 40 | 90 days supply | Qty: 90 | Fill #3

## 2015-06-16 ENCOUNTER — Encounter: Payer: Self-pay | Admitting: Family Medicine

## 2015-06-16 DIAGNOSIS — E119 Type 2 diabetes mellitus without complications: Secondary | ICD-10-CM | POA: Insufficient documentation

## 2015-06-16 DIAGNOSIS — I219 Acute myocardial infarction, unspecified: Secondary | ICD-10-CM | POA: Insufficient documentation

## 2015-06-16 DIAGNOSIS — K219 Gastro-esophageal reflux disease without esophagitis: Secondary | ICD-10-CM | POA: Insufficient documentation

## 2015-06-19 ENCOUNTER — Encounter: Payer: Self-pay | Admitting: Family Medicine

## 2015-06-19 ENCOUNTER — Ambulatory Visit (INDEPENDENT_AMBULATORY_CARE_PROVIDER_SITE_OTHER): Payer: 59 | Admitting: Family Medicine

## 2015-06-19 VITALS — BP 126/90 | HR 104 | Temp 98.5°F | Resp 18 | Ht 74.0 in | Wt 236.0 lb

## 2015-06-19 DIAGNOSIS — Z0000 Encounter for general adult medical examination without abnormal findings: Secondary | ICD-10-CM

## 2015-06-19 DIAGNOSIS — E1100 Type 2 diabetes mellitus with hyperosmolarity without nonketotic hyperglycemic-hyperosmolar coma (NKHHC): Secondary | ICD-10-CM

## 2015-06-19 LAB — LIPID PANEL
Cholesterol: 251 mg/dL — ABNORMAL HIGH (ref 125–200)
HDL: 44 mg/dL (ref >=40)
LDL CALC: 146 mg/dL — AB (ref <130)
Total CHOL/HDL Ratio: 5.7 Ratio — ABNORMAL HIGH (ref <=5.0)
Triglycerides: 303 mg/dL — ABNORMAL HIGH (ref <150)
VLDL: 61 mg/dL — AB (ref <30)

## 2015-06-19 LAB — CBC WITH DIFFERENTIAL/PLATELET
BASOS PCT: 1 % (ref 0–1)
Basophils Absolute: 0 10*3/uL (ref 0.0–0.1)
EOS PCT: 4 % (ref 0–5)
Eosinophils Absolute: 0.2 10*3/uL (ref 0.0–0.7)
HEMATOCRIT: 43 % (ref 39.0–52.0)
HEMOGLOBIN: 14.4 g/dL (ref 13.0–17.0)
Lymphocytes Relative: 24 % (ref 12–46)
Lymphs Abs: 1.1 10*3/uL (ref 0.7–4.0)
MCH: 24.6 pg — ABNORMAL LOW (ref 26.0–34.0)
MCHC: 33.5 g/dL (ref 30.0–36.0)
MCV: 73.5 fL — ABNORMAL LOW (ref 78.0–100.0)
MONO ABS: 0.6 10*3/uL (ref 0.1–1.0)
MONOS PCT: 13 % — AB (ref 3–12)
MPV: 9.5 fL (ref 8.6–12.4)
NEUTROS ABS: 2.7 10*3/uL (ref 1.7–7.7)
Neutrophils Relative %: 58 % (ref 43–77)
Platelets: 284 10*3/uL (ref 150–400)
RBC: 5.85 MIL/uL — AB (ref 4.22–5.81)
RDW: 15.3 % (ref 11.5–15.5)
WBC: 4.6 10*3/uL (ref 4.0–10.5)

## 2015-06-19 LAB — COMPLETE METABOLIC PANEL WITH GFR
ALBUMIN: 4.5 g/dL (ref 3.6–5.1)
ALT: 18 U/L (ref 9–46)
AST: 12 U/L (ref 10–35)
Alkaline Phosphatase: 82 U/L (ref 40–115)
BUN: 15 mg/dL (ref 7–25)
CO2: 25 mmol/L (ref 20–31)
CREATININE: 0.97 mg/dL (ref 0.70–1.33)
Calcium: 9.7 mg/dL (ref 8.6–10.3)
Chloride: 98 mmol/L (ref 98–110)
GFR, Est African American: >89 mL/min (ref >=60)
GFR, Est Non African American: 87 mL/min (ref >=60)
GLUCOSE: 333 mg/dL — AB (ref 70–99)
POTASSIUM: 4.6 mmol/L (ref 3.5–5.3)
SODIUM: 134 mmol/L — AB (ref 135–146)
Total Bilirubin: 0.7 mg/dL (ref 0.2–1.2)
Total Protein: 7.4 g/dL (ref 6.1–8.1)

## 2015-06-19 NOTE — Progress Notes (Signed)
Subjective:    Patient ID: Derrick Todd, male    DOB: 01-12-1961, 55 y.o.   MRN: [id number]  HPI Patient is here today to establish care. He is a very pleasant 55 year old African-American male. He works as a Engineer, civil (consulting) at [employer]. Past medical history is significant for non-ST elevation myocardial infarction in 2013 with bare metal stent placed in the right coronary artery. In 2014 he had a ST elevation myocardial infarction with another bare metal stent placed in the right coronary artery. He is on chronic dual antiplatelet therapy. He also has a history of hyperlipidemia, diabetes mellitus type 2 which is poorly controlled.  Patient has had Pneumovax 23. He declines Prevnar 13 today. His last colonoscopy was in 2007. He is due this year but he would like to wait till later this year. He is also due for prostate exam/PSA. His flu shot is up-to-date. His diabetic eye exam is up-to-date. Diabetic foot exam is performed today and is normal. He does have chronic venous stasis changes in both legs with some mild pitting edema and varicosities. Past Medical History  Diagnosis Date  . Dyslipidemia   . Tobacco abuse   . Hx of echocardiogram     a. 2-D echocardiogram 09/02/11: Mild LVH, EF 55-60%, basal inferior HK, mild LAE, PASP 32.;   b. Echo (03/20/2013):  Mild TR, EF 50-55%, mild LVH.  Marland Kitchen CAD (coronary artery disease)     a. inferior STEMI 5/13:  LHC prox-mid LAD 20-30%, mid LAD 70%, pOM 70%, pRCA 20%, mid 40%, AM Br 50-60%, dRCA 70% then 100%.  EF 55%.  PCI:  BMS to the distal RCA.;  b. inf STEMI (03/2013 at [facility]): LHC -  mLAD 60%, dCFX 70-80, mRCA 95, dRCA stent ok.  PCI:  Vision (2.5 x 15 mm) BMS to the mRCA.  Marland Kitchen DM2 (diabetes mellitus, type 2) (HCC)   . GERD (gastroesophageal reflux disease)   . Myocardial infarction (HCC)     2014 AND 2015  . Hyperlipidemia    Past Surgical History  Procedure Laterality Date  . Tumor removal  2012  . Left heart catheterization with coronary  angiogram [name] 09/02/2011    Procedure: LEFT HEART CATHETERIZATION WITH CORONARY ANGIOGRAM;  Surgeon: Herby Abraham, MD;  Location: [facility] CATH LAB;  Service: Cardiovascular;  Laterality: [name];  . Percutaneous coronary stent intervention (pci-s) Right 09/02/2011    Procedure: PERCUTANEOUS CORONARY STENT INTERVENTION (PCI-S);  Surgeon: Herby Abraham, MD;  Location: [facility] CATH LAB;  Service: Cardiovascular;  Laterality: Right;   Current Outpatient Prescriptions on File Prior to Visit  Medication Sig Dispense Refill  . ASPIR-LOW 81 MG EC tablet TAKE 1 TABLET (81 MG TOTAL) BY MOUTH DAILY. 30 tablet 1  . clopidogrel (PLAVIX) 75 MG tablet Take 1 tablet (75 mg total) by mouth daily. 90 tablet 3  . Coenzyme Q10 200 MG capsule Take 1 capsule (200 mg total) by mouth daily.    . furosemide (LASIX) 20 MG tablet Take 20 mg by mouth as needed for edema (feet and leg swelling).    Marland Kitchen glipiZIDE (GLUCOTROL) 10 MG tablet Take 1 tablet (10 mg total) by mouth 2 (two) times daily before a meal. 60 tablet 0  . lisinopril (PRINIVIL,ZESTRIL) 5 MG tablet TAKE 1 TABLET (5 MG TOTAL) BY MOUTH DAILY. 90 tablet 3  . metoprolol (LOPRESSOR) 50 MG tablet TAKE 1 TABLET (50 MG TOTAL) BY MOUTH 2 (TWO) TIMES DAILY. 180 tablet 3  . nitroGLYCERIN (NITROSTAT) 0.4  MG SL tablet Place 1 tablet (0.4 mg total) under the tongue every 5 (five) minutes x 3 doses as needed for chest pain. 25 tablet 4  . Omega-3 Fatty Acids (FISH OIL) 1200 MG CAPS Take by mouth daily. TAKE 1000 AND KRILL OIL 800    . pantoprazole (PROTONIX) 40 MG tablet TAKE 1 TABLET BY MOUTH DAILY. 90 tablet 3  . potassium chloride (K-DUR,KLOR-CON) 10 MEQ tablet One tablet daily as needed    . rosuvastatin (CRESTOR) 20 MG tablet TAKE 1 TABLET (20 MG TOTAL) BY MOUTH DAILY. 90 tablet 1  . sildenafil (VIAGRA) 50 MG tablet Take 1 tablet (50 mg total) by mouth daily as needed for erectile dysfunction. DO NOT TAKE NITROGLYCERIN WITHIN 24 HOURS OF TAKING VIAGRA 15 tablet 1   No current  facility-administered medications on file prior to visit.   Allergies  Allergen Reactions  . Livalo [Pitavastatin] Other (See Comments)    myalgias   Social History   Social History  . Marital Status: Married    Spouse Name: [name]  . Number of Children: [name]  . Years of Education: [name]   Occupational History  . Not on file.   Social History Main Topics  . Smoking status: Former Games developer  . Smokeless tobacco: Former Neurosurgeon    Quit date: 04/05/2011     Comment: quit in July  . Alcohol Use: 3.6 oz/week    3 Cans of beer, 3 Shots of liquor per week  . Drug Use: No  . Sexual Activity: Yes    Birth Control/ Protection: None   Other Topics Concern  . Not on file   Social History Narrative   Family History  Problem Relation Age of Onset  . Depression Mother   . Diabetes Mother   . Hypertension Mother   . Stroke Mother   . Arthritis Mother   . Hyperlipidemia Mother   . Learning disabilities Mother   . Mental illness Mother   . Diabetes Brother   . Heart attack Maternal Grandfather   . Hypertension Maternal Grandfather   . Heart failure Maternal Grandfather   . Hypertension Father   . Stroke Father   . Hypertension Brother      Review of Systems  All other systems reviewed and are negative.      Objective:   Physical Exam  Constitutional: He is oriented to person, place, and time. He appears well-developed and well-nourished. No distress.  HENT:  Head: Normocephalic and atraumatic.  Right Ear: External ear normal.  Left Ear: External ear normal.  Nose: Nose normal.  Mouth/Throat: Oropharynx is clear and moist. No oropharyngeal exudate.  Eyes: Conjunctivae and EOM are normal. Pupils are equal, round, and reactive to light. Right eye exhibits no discharge. Left eye exhibits no discharge. No scleral icterus.  Neck: Normal range of motion. Neck supple. No JVD present. No tracheal deviation present. No thyromegaly present.  Cardiovascular: Normal rate, regular rhythm,  normal heart sounds and intact distal pulses.  Exam reveals no gallop and no friction rub.   No murmur heard. Pulmonary/Chest: Effort normal and breath sounds normal. No stridor. No respiratory distress. He has no wheezes. He has no rales. He exhibits no tenderness.  Abdominal: Soft. Bowel sounds are normal. He exhibits no distension and no mass. There is no tenderness. There is no rebound and no guarding.  Musculoskeletal: Normal range of motion. He exhibits no edema or tenderness.  Lymphadenopathy:    He has no cervical adenopathy.  Neurological: He  is alert and oriented to person, place, and time. He has normal reflexes. He displays normal reflexes. No cranial nerve deficit. He exhibits normal muscle tone. Coordination normal.  Skin: Skin is warm. No rash noted. He is not diaphoretic. No erythema. No pallor.  Psychiatric: He has a normal mood and affect. His behavior is normal. Judgment and thought content normal.  Vitals reviewed.         Assessment & Plan:  Uncontrolled type 2 diabetes mellitus with hyperosmolarity without coma, without long-term current use of insulin (HCC) - Plan: CBC with Differential/Platelet, COMPLETE METABOLIC PANEL WITH GFR, Hemoglobin A1c, Lipid panel, Microalbumin, urine  Routine general medical examination at a health care facility - Plan: CBC with Differential/Platelet, COMPLETE METABOLIC PANEL WITH GFR, Lipid panel, PSA  Patient is a very pleasant 55 year old African-American male. He is a real likable guy but I do not think he is been taking care of himself the way he should. He acknowledges this. We will check a hemoglobin A1c today. Given his history of coronary artery disease, if his hemoglobin A1c is elevated, I would recommend adding jardiance given its recent study showing reduction in mortality for patients with a history of coronary artery disease. I will also check a fasting lipid panel. His goal LDL cholesterol is less than 70. I will also check a  microalbumin as well as a PSA for prostate cancer screening. His blood pressures acceptable. Diabetic eye exam is up-to-date. Diabetic foot exam is performed today and is normal. Also the patient hepatitis C and HIV screening but he politely declined. He is due for colonoscopy but he would prefer to defer this to later this year. He can call me when he is ready for me to schedule this and will

## 2015-06-20 LAB — HEMOGLOBIN A1C
Hgb A1c MFr Bld: 11.9 % — ABNORMAL HIGH (ref <5.7)
Mean Plasma Glucose: 295 mg/dL — ABNORMAL HIGH (ref <117)

## 2015-06-20 LAB — MICROALBUMIN, URINE: Microalb, Ur: 5 mg/dL

## 2015-06-20 LAB — PSA: PSA: 0.28 ng/mL (ref <=4.00)

## 2015-06-29 ENCOUNTER — Other Ambulatory Visit: Payer: Self-pay | Admitting: Family Medicine

## 2015-06-29 ENCOUNTER — Encounter: Payer: Self-pay | Admitting: Family Medicine

## 2015-06-29 MED ORDER — ROSUVASTATIN CALCIUM 40 MG PO TABS: ORAL_TABLET | ORAL | Status: DC

## 2015-06-29 MED ORDER — EMPAGLIFLOZIN-LINAGLIPTIN 25-5 MG PO TABS: 1.0000 | ORAL_TABLET | Freq: Every day | ORAL | Status: DC

## 2015-06-29 MED ORDER — GLIPIZIDE 10 MG PO TABS: 10.0000 mg | ORAL_TABLET | Freq: Two times a day (BID) | ORAL | Status: DC

## 2015-06-29 MED FILL — GLYXAMBI 25 MG-5 MG TABLET: 25-5 | 30 days supply | Qty: 30 | Fill #0

## 2015-06-29 MED FILL — glipiZIDE 10 MG TABS: 10 | 30 days supply | Qty: 60 | Fill #0

## 2015-08-07 ENCOUNTER — Encounter: Payer: Self-pay | Admitting: Family Medicine

## 2015-08-10 ENCOUNTER — Other Ambulatory Visit: Payer: Self-pay | Admitting: Family Medicine

## 2015-08-10 MED FILL — ROSUVASTATIN CALCIUM 40 MG: 40 | 30 days supply | Qty: 30 | Fill #0

## 2015-08-10 MED FILL — GLYXAMBI 25 MG-5 MG TABLET: 25-5 | 30 days supply | Qty: 30 | Fill #1

## 2015-08-10 MED FILL — glipiZIDE 10 MG TABS: 10 | 30 days supply | Qty: 60 | Fill #0

## 2015-08-10 NOTE — Telephone Encounter (Signed)
Refill appropriate and filled per protocol. 

## 2015-09-11 ENCOUNTER — Ambulatory Visit: Payer: 59 | Admitting: Cardiology

## 2015-09-28 ENCOUNTER — Encounter: Payer: Self-pay | Admitting: Cardiology

## 2015-09-28 ENCOUNTER — Ambulatory Visit (INDEPENDENT_AMBULATORY_CARE_PROVIDER_SITE_OTHER): Payer: 59 | Admitting: Cardiology

## 2015-09-28 VITALS — BP 122/80 | HR 84 | Ht 74.0 in | Wt 233.8 lb

## 2015-09-28 DIAGNOSIS — Z720 Tobacco use: Secondary | ICD-10-CM | POA: Diagnosis not present

## 2015-09-28 DIAGNOSIS — I2510 Atherosclerotic heart disease of native coronary artery without angina pectoris: Secondary | ICD-10-CM

## 2015-09-28 DIAGNOSIS — R0602 Shortness of breath: Secondary | ICD-10-CM | POA: Diagnosis not present

## 2015-09-28 DIAGNOSIS — E785 Hyperlipidemia, unspecified: Secondary | ICD-10-CM | POA: Diagnosis not present

## 2015-09-28 MED ORDER — FUROSEMIDE 20 MG PO TABS
20.0000 mg | ORAL_TABLET | ORAL | Status: DC | PRN
Start: 2015-09-28 — End: 2016-04-07

## 2015-09-28 MED ORDER — LISINOPRIL 5 MG PO TABS: ORAL_TABLET | ORAL | Status: DC

## 2015-09-28 MED ORDER — NITROGLYCERIN 0.4 MG SL SUBL: 0.4000 mg | SUBLINGUAL_TABLET | SUBLINGUAL | Status: DC | PRN

## 2015-09-28 MED ORDER — METOPROLOL TARTRATE 50 MG PO TABS: ORAL_TABLET | ORAL | Status: DC

## 2015-09-28 MED ORDER — PANTOPRAZOLE SODIUM 40 MG PO TBEC: DELAYED_RELEASE_TABLET | ORAL | Status: DC

## 2015-09-28 MED ORDER — ROSUVASTATIN CALCIUM 40 MG PO TABS: ORAL_TABLET | ORAL | Status: DC

## 2015-09-28 MED ORDER — POTASSIUM CHLORIDE CRYS ER 10 MEQ PO TBCR: EXTENDED_RELEASE_TABLET | ORAL | Status: DC

## 2015-09-28 MED ORDER — CLOPIDOGREL BISULFATE 75 MG PO TABS: 75.0000 mg | ORAL_TABLET | Freq: Every day | ORAL | Status: DC

## 2015-09-28 MED FILL — FUROSEMIDE 20 MG TABLET: 20 | 90 days supply | Qty: 90 | Fill #0

## 2015-09-28 MED FILL — KLOR-CON M10 TABLET: 10 | 90 days supply | Qty: 90 | Fill #0

## 2015-09-28 MED FILL — CLOPIDOGREL 75 MG TABLET: 75 | 90 days supply | Qty: 90 | Fill #0

## 2015-09-28 MED FILL — PANTOPRAZOLE SOD DR 40 MG T: 40 | 90 days supply | Qty: 90 | Fill #0

## 2015-09-28 MED FILL — ROSUVASTATIN CALCIUM 40 MG: 40 | 90 days supply | Qty: 90 | Fill #0

## 2015-09-28 MED FILL — NITROGLYCERIN 0.4 MG TAB SL: 0.4 | 13 days supply | Qty: 25 | Fill #0

## 2015-09-28 MED FILL — METOPROLOL TARTRATE 50 MG T: 50 | 90 days supply | Qty: 180 | Fill #0

## 2015-09-28 MED FILL — LISINOPRIL 5 MG TABLET: 5 | 90 days supply | Qty: 90 | Fill #0

## 2015-09-28 NOTE — Patient Instructions (Signed)
Medication Instructions:   Your physician recommends that you continue on your current medications as directed. Please refer to the Current Medication list given to you today.   If you need a refill on your cardiac medications before your next appointment, please call your pharmacy.  Labwork:  NONE ORDER TODAY    Testing/Procedures: NONE ORDER TODAY    Follow-Up: Your physician wants you to follow-up in:  IN 6   MONTHS WITH DR [facility]  You will receive a reminder letter in the mail two months in advance. If you don't receive a letter, please call our office to schedule the follow-up appointment.      Any Other Special Instructions Will Be Listed Below (If Applicable).

## 2015-09-28 NOTE — Progress Notes (Signed)
09/28/2015 Derrick Todd   1960-06-09  [id number]  Primary Physician Lynnea Ferrier TOM, MD Primary Cardiologist: Dr. Shirlee Latch   Reason for Visit/CC: Routine f/u for CAD  HPI:  55 y/o male, followed by Dr. Shirlee Latch.  He presents today for routine f/u. He works as a Engineer, building services at [employer].  He has a history of CAD. In 5/13, he had an NSTEMI with BMS to totally occluded distal RCA. In 12/14, he had an inferior STEMI with 95% mid RCA (distal RCA stent patent) at [facility]He had BMS to [facility]. Echo (12/14) showed EF 50-55%. He quit smoking in 7/14. Echo (6/15) showed EF 55-60% with basal to mid inferolateral hypokinesis (no significant change from prior). He also has HLD and poorly controlled DM (recent Hgb A1c was 12). He has had myalgias with atorvastatin and was actually off his statin when he had the 12/14 MI. He is on Crestor QOD in order to tolerate.  Today in f/u. He reports he has done well. He denies any anginal symptoms, including no exertional CP nor dyspnea. No LEE, orthopnea, PND, palpitations, syncope/ near syncope. He reports full medication compliance. His PCP recent adjusted his DM meds. EKG shows NSR. No ischemia. BP is well controlled.    Current Outpatient Prescriptions  Medication Sig Dispense Refill  . ASPIR-LOW 81 MG EC tablet TAKE 1 TABLET (81 MG TOTAL) BY MOUTH DAILY. 30 tablet 1  . clopidogrel (PLAVIX) 75 MG tablet Take 1 tablet (75 mg total) by mouth daily. 90 tablet 3  . Coenzyme Q10 200 MG capsule Take 1 capsule (200 mg total) by mouth daily.    . Empagliflozin-Linagliptin (GLYXAMBI) 25-5 MG TABS Take 1 tablet by mouth daily. 30 tablet 3  . furosemide (LASIX) 20 MG tablet Take 20 mg by mouth as needed for edema (feet and leg swelling).    Marland Kitchen glipiZIDE (GLUCOTROL) 10 MG tablet TAKE 1 TABLET (10 MG TOTAL) BY MOUTH 2 (TWO) TIMES DAILY BEFORE A MEAL. 60 tablet PRN  . lisinopril (PRINIVIL,ZESTRIL) 5 MG tablet TAKE 1 TABLET (5 MG TOTAL) BY MOUTH DAILY. 90  tablet 3  . metoprolol (LOPRESSOR) 50 MG tablet TAKE 1 TABLET (50 MG TOTAL) BY MOUTH 2 (TWO) TIMES DAILY. 180 tablet 3  . nitroGLYCERIN (NITROSTAT) 0.4 MG SL tablet Place 1 tablet (0.4 mg total) under the tongue every 5 (five) minutes x 3 doses as needed for chest pain. 25 tablet 4  . Omega-3 Fatty Acids (FISH OIL) 1200 MG CAPS Take by mouth daily. TAKE 1000 AND KRILL OIL 800    . pantoprazole (PROTONIX) 40 MG tablet TAKE 1 TABLET BY MOUTH DAILY. 90 tablet 3  . potassium chloride (K-DUR,KLOR-CON) 10 MEQ tablet One tablet daily as needed    . rosuvastatin (CRESTOR) 40 MG tablet TAKE 1 TABLET (20 MG TOTAL) BY MOUTH DAILY. 30 tablet 3  . sildenafil (VIAGRA) 50 MG tablet Take 1 tablet (50 mg total) by mouth daily as needed for erectile dysfunction. DO NOT TAKE NITROGLYCERIN WITHIN 24 HOURS OF TAKING VIAGRA 15 tablet 1   No current facility-administered medications for this visit.    Allergies  Allergen Reactions  . Livalo [Pitavastatin] Other (See Comments)    myalgias    Social History   Social History  . Marital Status: Married    Spouse Name: [name]  . Number of Children: [name]  . Years of Education: [name]   Occupational History  . Not on file.   Social History Main Topics  .  Smoking status: Former Games developer  . Smokeless tobacco: Former Neurosurgeon    Quit date: 04/05/2011     Comment: quit in July  . Alcohol Use: 3.6 oz/week    3 Cans of beer, 3 Shots of liquor per week  . Drug Use: No  . Sexual Activity: Yes    Birth Control/ Protection: None   Other Topics Concern  . Not on file   Social History Narrative     Review of Systems: General: negative for chills, fever, night sweats or weight changes.  Cardiovascular: negative for chest pain, dyspnea on exertion, edema, orthopnea, palpitations, paroxysmal nocturnal dyspnea or shortness of breath Dermatological: negative for rash Respiratory: negative for cough or wheezing Urologic: negative for hematuria Abdominal: negative for nausea,  vomiting, diarrhea, bright red blood per rectum, melena, or hematemesis Neurologic: negative for visual changes, syncope, or dizziness All other systems reviewed and are otherwise negative except as noted above.    Blood pressure 122/80, pulse 84, height 6\' 2"  (1.88 m), weight 233 lb 12.8 oz (106.051 kg).  General appearance: alert, cooperative and no distress Neck: no carotid bruit and no JVD Lungs: clear to auscultation bilaterally Heart: regular rate and rhythm, S1, S2 normal, no murmur, click, rub or gallop Extremities: no LEE Pulses: 2+ and symmetric Skin: warm and dry Neurologic: Grossly normal  EKG NSR. No ischemia. 84 bpm   ASSESSMENT AND PLAN:   1. CAD: s/p NSTEMI in 2013 and STEMI in 2014 resulting in stenting of his RCA both times. Normal EF. On appropriate medical therapy. DAPT with ASA + Plavix, BB, ACE-I and statin. Stable w/o anginal symptomatology. Continue medical therapy.   2. HLD: lipid panel is followed by his PCP who recently increased his Crestor to 40 mg as LDL was 146. Patient encouraged to modify diet (low fat) and increase physical activity. His PCP has ordered repeat FLP.  3. DM: poorly controlled with recent Hgb A1c of 12. PCP managing.   PLAN  F/u in 6 months.   Robbie Lis PA-C 09/28/2015 9:49 AM

## 2015-10-28 MED FILL — GLYXAMBI 25 MG-5 MG TABLET: 25-5 | 30 days supply | Qty: 30 | Fill #2

## 2015-10-28 MED FILL — glipiZIDE 10 MG TABS: 10 | 30 days supply | Qty: 60 | Fill #1

## 2015-11-05 MED FILL — SULFAMETHOXAZOLE/TMP DS TAB: 800-160 | 5 days supply | Qty: 10 | Fill #0

## 2015-12-08 MED FILL — glipiZIDE 10 MG TABS: 10 | 30 days supply | Qty: 60 | Fill #2

## 2015-12-08 MED FILL — GLYXAMBI 25 MG-5 MG TABLET: 25-5 | 30 days supply | Qty: 30 | Fill #3

## 2015-12-21 ENCOUNTER — Encounter: Payer: Self-pay | Admitting: Family Medicine

## 2015-12-21 ENCOUNTER — Ambulatory Visit (INDEPENDENT_AMBULATORY_CARE_PROVIDER_SITE_OTHER): Payer: 59 | Admitting: Family Medicine

## 2015-12-21 VITALS — BP 130/74 | HR 84 | Temp 98.7°F | Resp 18 | Ht 74.0 in | Wt 234.0 lb

## 2015-12-21 DIAGNOSIS — E119 Type 2 diabetes mellitus without complications: Secondary | ICD-10-CM | POA: Diagnosis not present

## 2015-12-21 DIAGNOSIS — E785 Hyperlipidemia, unspecified: Secondary | ICD-10-CM

## 2015-12-21 DIAGNOSIS — Z658 Other specified problems related to psychosocial circumstances: Secondary | ICD-10-CM

## 2015-12-21 DIAGNOSIS — I2510 Atherosclerotic heart disease of native coronary artery without angina pectoris: Secondary | ICD-10-CM | POA: Diagnosis not present

## 2015-12-21 DIAGNOSIS — F439 Reaction to severe stress, unspecified: Secondary | ICD-10-CM

## 2015-12-21 LAB — CBC WITH DIFFERENTIAL/PLATELET
BASOS ABS: 51 {cells}/uL (ref 0–200)
Basophils Relative: 1 %
EOS ABS: 204 {cells}/uL (ref 15–500)
Eosinophils Relative: 4 %
HEMATOCRIT: 43.1 % (ref 38.5–50.0)
HEMOGLOBIN: 14.2 g/dL (ref 13.0–17.0)
LYMPHS ABS: 1173 {cells}/uL (ref 850–3900)
Lymphocytes Relative: 23 %
MCH: 23.9 pg — AB (ref 27.0–33.0)
MCHC: 32.9 g/dL (ref 32.0–36.0)
MCV: 72.4 fL — AB (ref 80.0–100.0)
MONO ABS: 612 {cells}/uL (ref 200–950)
MPV: 9.4 fL (ref 7.5–12.5)
Monocytes Relative: 12 %
NEUTROS PCT: 60 %
Neutro Abs: 3060 cells/uL (ref 1500–7800)
Platelets: 282 10*3/uL (ref 140–400)
RBC: 5.95 MIL/uL — ABNORMAL HIGH (ref 4.20–5.80)
RDW: 16.6 % — ABNORMAL HIGH (ref 11.0–15.0)
WBC: 5.1 10*3/uL (ref 3.8–10.8)

## 2015-12-21 MED ORDER — ESCITALOPRAM OXALATE 10 MG PO TABS: 10.0000 mg | ORAL_TABLET | Freq: Every day | ORAL | 5 refills | Status: DC

## 2015-12-21 MED ORDER — HYDROXYZINE PAMOATE 25 MG PO CAPS: 50.0000 mg | ORAL_CAPSULE | Freq: Three times a day (TID) | ORAL | 3 refills | Status: DC | PRN

## 2015-12-21 MED FILL — ESCITALOPRAM 10 MG TABLET: 10 | 30 days supply | Qty: 30 | Fill #0

## 2015-12-21 MED FILL — HYDROXYZINE PAM 25 MG CAP: 25 | 10 days supply | Qty: 60 | Fill #0

## 2015-12-21 NOTE — Progress Notes (Signed)
Subjective:    Patient ID: Derrick Todd, male    DOB: [birth year], 55 y.o.   MRN: [id number]  HPI  06/2015  Patient is here today to establish care. He is a very pleasant 55 year old African-American male. He works as a Engineer, civil (consulting) at [employer]. Past medical history is significant for non-ST elevation myocardial infarction in 2013 with bare metal stent placed in the right coronary artery. In 2014 he had a ST elevation myocardial infarction with another bare metal stent placed in the right coronary artery. He is on chronic dual antiplatelet therapy. He also has a history of hyperlipidemia, diabetes mellitus type 2 which is poorly controlled.  Patient has had Pneumovax 23. He declines Prevnar 13 today. His last colonoscopy was in 2007. He is due this year but he would like to wait till later this year. He is also due for prostate exam/PSA. His flu shot is up-to-date. His diabetic eye exam is up-to-date. Diabetic foot exam is performed today and is normal. He does have chronic venous stasis changes in both legs with some mild pitting edema and varicosities.  At that time, my plan was: Patient is a very pleasant 55 year old African-American male. He is a real likable guy but I do not think he is been taking care of himself the way he should. He acknowledges this. We will check a hemoglobin A1c today. Given his history of coronary artery disease, if his hemoglobin A1c is elevated, I would recommend adding jardiance given its recent study showing reduction in mortality for patients with a history of coronary artery disease. I will also check a fasting lipid panel. His goal LDL cholesterol is less than 70. I will also check a microalbumin as well as a PSA for prostate cancer screening. His blood pressures acceptable. Diabetic eye exam is up-to-date. Diabetic foot exam is performed today and is normal. Also the patient hepatitis C and HIV screening but he politely declined. He is due for colonoscopy but he would prefer  to defer this to later this year. He can call me when he is ready for me to schedule this and will  12/21/15 Patient has not been seen since that time. He is overdue to follow-up for his diabetes. However his biggest concern today and the reason he made his appointment is insomnia. Over the last 5 months, the patient has been battling depression. The majority of his problems stem from trouble he is having at work. 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 He does report depression and anhedonia as well as insomnia. He works 7 PM to 7 AM he goes home in the morning, lies down, and finds himself unable to fall asleep. He states it's been weeks since he got a decent rest. Therefore he is extremely tired. He is falling asleep occasionally at work. He was trying to transfer out of the unit but has been unsuccessful as of now. He denies any suicidal ideation, hallucinations, delusions. Past Medical History:  Diagnosis Date  . CAD (coronary artery disease)    a. inferior STEMI 5/13:  LHC prox-mid LAD 20-30%, mid LAD 70%, pOM 70%, pRCA 20%, mid 40%, AM Br 50-60%, dRCA 70% then 100%.  EF 55%.  PCI:  BMS to the distal RCA.;  b. inf STEMI (03/2013 at [facility]): LHC -  mLAD 60%, dCFX 70-80, mRCA 95, dRCA stent ok.  PCI:  Vision (2.5 x 15 mm) BMS to the mRCA.  Marland Kitchen. DM2 (diabetes mellitus, type 2) (HCC)   . Dyslipidemia   . GERD (gastroesophageal reflux disease)   . Hx of echocardiogram    a. 2-D echocardiogram 09/02/11: Mild LVH, EF 55-60%, basal inferior HK, mild LAE, PASP 32.;   b. Echo (03/20/2013):  Mild TR, EF 50-55%, mild LVH.  Marland Kitchen. Hyperlipidemia   . Myocardial infarction (HCC)    2014 AND 2015  . Tobacco abuse    Past Surgical History:  Procedure Laterality Date  . LEFT HEART CATHETERIZATION WITH CORONARY ANGIOGRAM [name] 09/02/2011   Procedure: LEFT HEART CATHETERIZATION WITH  CORONARY ANGIOGRAM;  Surgeon: Herby Abrahamhomas D Stuckey, MD;  [facility];  Service: Cardiovascular;  Laterality: [name];  . PERCUTANEOUS CORONARY STENT INTERVENTION (PCI-S) Right 09/02/2011   Procedure: PERCUTANEOUS CORONARY STENT INTERVENTION (PCI-S);  Surgeon: Herby Abrahamhomas D Stuckey, MD;  [facility];  Service: Cardiovascular;  Laterality: Right;  . TUMOR REMOVAL  2012   Current Outpatient Prescriptions on File Prior to Visit  Medication Sig Dispense Refill  . ASPIR-LOW 81 MG EC tablet TAKE 1 TABLET (81 MG TOTAL) BY MOUTH DAILY. 30 tablet 1  . clopidogrel (PLAVIX) 75 MG tablet Take 1 tablet (75 mg total) by mouth daily. 90 tablet 3  . Coenzyme Q10 200 MG capsule Take 1 capsule (200 mg total) by mouth daily.    . Empagliflozin-Linagliptin (GLYXAMBI) 25-5 MG TABS Take 1 tablet by mouth daily. 30 tablet 3  . furosemide (LASIX) 20 MG tablet Take 1 tablet (20 mg total) by mouth as needed for edema (feet and leg swelling). 90 tablet 3  . glipiZIDE (GLUCOTROL) 10 MG tablet TAKE 1 TABLET (10 MG TOTAL) BY MOUTH 2 (TWO) TIMES DAILY BEFORE A MEAL. 60 tablet PRN  . lisinopril (PRINIVIL,ZESTRIL) 5 MG tablet TAKE 1 TABLET (5 MG TOTAL) BY MOUTH DAILY. 90 tablet 3  . metoprolol (LOPRESSOR) 50 MG tablet TAKE 1 TABLET (50 MG TOTAL) BY MOUTH 2 (TWO) TIMES DAILY. 180 tablet 3  . nitroGLYCERIN (NITROSTAT) 0.4 MG SL tablet Place 1 tablet (0.4 mg total) under the tongue every 5 (five) minutes x 3 doses as needed for chest pain. 25 tablet 4  . Omega-3 Fatty Acids (FISH OIL) 1200 MG CAPS Take by mouth daily. TAKE 1000 AND KRILL OIL 800    . pantoprazole (PROTONIX) 40 MG tablet TAKE 1 TABLET BY MOUTH DAILY. 90 tablet 3  . potassium chloride (K-DUR,KLOR-CON) 10 MEQ tablet One tablet daily as needed 90 tablet 3  . rosuvastatin (CRESTOR) 40 MG tablet TAKE 1 TABLET (20 MG TOTAL) BY MOUTH DAILY. 30 tablet 3  . sildenafil (VIAGRA) 50 MG tablet Take 1 tablet (50 mg total) by mouth daily as needed for erectile dysfunction. DO  NOT TAKE NITROGLYCERIN WITHIN 24 HOURS OF TAKING VIAGRA 15 tablet 1   No current facility-administered medications on file prior to visit.    Allergies  Allergen Reactions  . Livalo [Pitavastatin] Other (See Comments)    myalgias   Social History   Social History  . Marital status: Married    Spouse name: [name]  . Number of children: [name]  . Years of education: [name]   Occupational History  . Not on file.   Social History Main Topics  . Smoking status: Former Games developermoker  . Smokeless tobacco: Former NeurosurgeonUser    Quit date: 04/05/2011     Comment: quit in July  . Alcohol use 3.6 oz/week  3 Cans of beer, 3 Shots of liquor per week  . Drug use: No  . Sexual activity: Yes    Birth control/ protection: None   Other Topics Concern  . Not on file   Social History Narrative  . No narrative on file   Family History  Problem Relation Age of Onset  . Depression Mother   . Diabetes Mother   . Hypertension Mother   . Stroke Mother   . Arthritis Mother   . Hyperlipidemia Mother   . Learning disabilities Mother   . Mental illness Mother   . Diabetes Brother   . Heart attack Maternal Grandfather   . Hypertension Maternal Grandfather   . Heart failure Maternal Grandfather   . Hypertension Father   . Stroke Father   . Hypertension Brother      Review of Systems  Psychiatric/Behavioral: The patient has insomnia.   All other systems reviewed and are negative.      Objective:   Physical Exam  Constitutional: He is oriented to person, place, and time. He appears well-developed and well-nourished. No distress.  HENT:  Head: Normocephalic and atraumatic.  Right Ear: External ear normal.  Left Ear: External ear normal.  Nose: Nose normal.  Mouth/Throat: Oropharynx is clear and moist. No oropharyngeal exudate.  Eyes: Conjunctivae and EOM are normal. Pupils are equal, round, and reactive to light. Right eye exhibits no discharge. Left eye exhibits no discharge. No scleral icterus.    Neck: Normal range of motion. Neck supple. No JVD present. No tracheal deviation present. No thyromegaly present.  Cardiovascular: Normal rate, regular rhythm, normal heart sounds and intact distal pulses.  Exam reveals no gallop and no friction rub.   No murmur heard. Pulmonary/Chest: Effort normal and breath sounds normal. No stridor. No respiratory distress. He has no wheezes. He has no rales. He exhibits no tenderness.  Abdominal: Soft. Bowel sounds are normal. He exhibits no distension and no mass. There is no tenderness. There is no rebound and no guarding.  Musculoskeletal: Normal range of motion. He exhibits no edema or tenderness.  Lymphadenopathy:    He has no cervical adenopathy.  Neurological: He is alert and oriented to person, place, and time. He has normal reflexes. No cranial nerve deficit. He exhibits normal muscle tone. Coordination normal.  Skin: Skin is warm. No rash noted. He is not diaphoretic. No erythema. No pallor.  Psychiatric: He has a normal mood and affect. His behavior is normal. Judgment and thought content normal.  Vitals reviewed.         Assessment & Plan:  Situational stress - Plan: escitalopram (LEXAPRO) 10 MG tablet, hydrOXYzine (VISTARIL) 25 MG capsule  Controlled type 2 diabetes mellitus with diabetic nephropathy, without long-term current use of insulin (HCC)  Controlled type 2 diabetes mellitus without complication, without long-term current use of insulin (HCC) - Plan: COMPLETE METABOLIC PANEL WITH GFR, CBC with Differential/Platelet, Lipid panel, Hemoglobin A1c  Begin Lexapro 10 mg by mouth daily for depression. Use hydroxyzine 25 mg to 50 mg by mouth daily at bedtime as needed for insomnia. Recheck in one month. While I have the patient here I'm also going to check a hemoglobin A1c as well as a fasting lipid panel to monitor the control of his chronic medical problems.

## 2015-12-22 LAB — COMPLETE METABOLIC PANEL WITH GFR
ALT: 13 U/L (ref 9–46)
AST: 13 U/L (ref 10–35)
Albumin: 4.3 g/dL (ref 3.6–5.1)
Alkaline Phosphatase: 49 U/L (ref 40–115)
BUN: 16 mg/dL (ref 7–25)
CHLORIDE: 104 mmol/L (ref 98–110)
CO2: 25 mmol/L (ref 20–31)
CREATININE: 0.95 mg/dL (ref 0.70–1.33)
Calcium: 9.5 mg/dL (ref 8.6–10.3)
GFR, Est African American: >89 mL/min (ref >=60)
GFR, Est Non African American: 89 mL/min (ref >=60)
GLUCOSE: 95 mg/dL (ref 70–99)
Potassium: 4.6 mmol/L (ref 3.5–5.3)
Sodium: 139 mmol/L (ref 135–146)
Total Bilirubin: 1 mg/dL (ref 0.2–1.2)
Total Protein: 7.2 g/dL (ref 6.1–8.1)

## 2015-12-22 LAB — LIPID PANEL
CHOL/HDL RATIO: 5.6 ratio — AB (ref <=5.0)
CHOLESTEROL: 291 mg/dL — AB (ref 125–200)
HDL: 52 mg/dL (ref >=40)
LDL Cholesterol: 212 mg/dL — ABNORMAL HIGH (ref <130)
TRIGLYCERIDES: 136 mg/dL (ref <150)
VLDL: 27 mg/dL (ref <30)

## 2015-12-22 LAB — HEMOGLOBIN A1C
Hgb A1c MFr Bld: 7.8 % — ABNORMAL HIGH (ref <5.7)
Mean Plasma Glucose: 177 mg/dL

## 2015-12-26 ENCOUNTER — Encounter: Payer: Self-pay | Admitting: Family Medicine

## 2015-12-28 MED ORDER — METFORMIN HCL 1000 MG PO TABS: 1000.0000 mg | ORAL_TABLET | Freq: Two times a day (BID) | ORAL | 3 refills | Status: DC

## 2015-12-28 MED FILL — metFORMIN HCL 1000 MG TABS: 1000 | 90 days supply | Qty: 180 | Fill #0

## 2015-12-29 ENCOUNTER — Telehealth: Payer: Self-pay | Admitting: Family Medicine

## 2015-12-29 NOTE — Telephone Encounter (Signed)
Received FMLA forms from Dr. Tanya Nones.  Forms faxed to [employer]. [phone]

## 2016-01-19 ENCOUNTER — Other Ambulatory Visit: Payer: Self-pay | Admitting: Family Medicine

## 2016-01-19 MED FILL — GLYXAMBI 25 MG-5 MG TABLET: 25-5 | 30 days supply | Qty: 30 | Fill #0

## 2016-01-19 MED FILL — PANTOPRAZOLE SOD DR 40 MG T: 40 | 90 days supply | Qty: 90 | Fill #1

## 2016-01-19 MED FILL — ESCITALOPRAM 10 MG TABLET: 10 | 30 days supply | Qty: 30 | Fill #1

## 2016-01-19 MED FILL — CLOPIDOGREL 75 MG TABLET: 75 | 90 days supply | Qty: 90 | Fill #1

## 2016-01-19 MED FILL — FUROSEMIDE 20 MG TABLET: 20 | 30 days supply | Qty: 30 | Fill #1

## 2016-01-19 MED FILL — LISINOPRIL 5 MG TABLET: 5 | 90 days supply | Qty: 90 | Fill #1

## 2016-01-19 MED FILL — glipiZIDE 10 MG TABS: 10 | 30 days supply | Qty: 60 | Fill #3

## 2016-01-19 MED FILL — HYDROXYZINE PAM 25 MG CAP: 25 | 10 days supply | Qty: 60 | Fill #1

## 2016-01-19 MED FILL — KLOR-CON M10 TABLET: 10 | 90 days supply | Qty: 90 | Fill #1

## 2016-01-19 MED FILL — METOPROLOL TARTRATE 50 MG T: 50 | 90 days supply | Qty: 180 | Fill #1

## 2016-01-19 MED FILL — ROSUVASTATIN CALCIUM 40 MG: 40 | 30 days supply | Qty: 30 | Fill #1

## 2016-01-21 ENCOUNTER — Encounter: Payer: Self-pay | Admitting: Family Medicine

## 2016-01-21 ENCOUNTER — Ambulatory Visit (INDEPENDENT_AMBULATORY_CARE_PROVIDER_SITE_OTHER): Payer: 59 | Admitting: Family Medicine

## 2016-01-21 VITALS — BP 110/74 | HR 78 | Temp 98.4°F | Resp 16 | Ht 74.0 in | Wt 238.0 lb

## 2016-01-21 DIAGNOSIS — R5382 Chronic fatigue, unspecified: Secondary | ICD-10-CM

## 2016-01-21 DIAGNOSIS — F439 Reaction to severe stress, unspecified: Secondary | ICD-10-CM

## 2016-01-21 DIAGNOSIS — F5101 Primary insomnia: Secondary | ICD-10-CM

## 2016-01-21 NOTE — Progress Notes (Signed)
Subjective:    Patient ID: Derrick Todd, male    DOB: July 06, 1960, 55 y.o.   MRN: [id number]  Insomnia     06/2015  Patient is here today to establish care. He is a very pleasant 55 year old African-American male. He works as a Engineer, civil (consulting) at [employer]. Past medical history is significant for non-ST elevation myocardial infarction in 2013 with bare metal stent placed in the right coronary artery. In 2014 he had a ST elevation myocardial infarction with another bare metal stent placed in the right coronary artery. He is on chronic dual antiplatelet therapy. He also has a history of hyperlipidemia, diabetes mellitus type 2 which is poorly controlled.  Patient has had Pneumovax 23. He declines Prevnar 13 today. His last colonoscopy was in 2007. He is due this year but he would like to wait till later this year. He is also due for prostate exam/PSA. His flu shot is up-to-date. His diabetic eye exam is up-to-date. Diabetic foot exam is performed today and is normal. He does have chronic venous stasis changes in both legs with some mild pitting edema and varicosities.  At that time, my plan was: Patient is a very pleasant 55 year old African-American male. He is a real likable guy but I do not think he is been taking care of himself the way he should. He acknowledges this. We will check a hemoglobin A1c today. Given his history of coronary artery disease, if his hemoglobin A1c is elevated, I would recommend adding jardiance given its recent study showing reduction in mortality for patients with a history of coronary artery disease. I will also check a fasting lipid panel. His goal LDL cholesterol is less than 70. I will also check a microalbumin as well as a PSA for prostate cancer screening. His blood pressures acceptable. Diabetic eye exam is up-to-date. Diabetic foot exam is performed today and is normal. Also the patient hepatitis C and HIV screening but he politely declined. He is due for colonoscopy but he  would prefer to defer this to later this year. He can call me when he is ready for me to schedule this and will  12/21/15 Patient has not been seen since that time. He is overdue to follow-up for his diabetes. However his biggest concern today and the reason he made his appointment is insomnia. Over the last 5 months, the patient has been battling depression. The majority of his problems stem from trouble he is having at work. 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 He does report depression and anhedonia as well as insomnia. He works 7 PM to 7 AM he goes home in the morning, lies down, and finds himself unable to fall asleep. He states it's been weeks since he got a decent rest. Therefore he is extremely tired. He is falling asleep occasionally at work. He was trying to transfer out of the unit but has been unsuccessful as of now. He denies any suicidal ideation, hallucinations, delusions.  At that time, my plan was: Begin Lexapro 10 mg by mouth daily for depression. Use hydroxyzine 25 mg to 50 mg by mouth daily at bedtime as needed for insomnia. Recheck in one month. While I have the patient here I'm also going to check a hemoglobin A1c as well as a fasting lipid panel to monitor the control of his chronic medical problems.  01/21/16 He  is here today for follow-up. His last office visit, his LDL cholesterol was found to be greater than 200!Marland Kitchen He states that he has not been regular in taking his Crestor. He states that he takes less than 50% of the time due to myalgias in his legs. He has a significant history of coronary artery disease status post myocardial infarction on 2 separate occasions. I explained in great detail how high his cholesterol is and how it is necessary for [place] to control this to help prevent future heart attacks. He would like to try to take the medication on a  daily basis and then recheck his cholesterol in 3 months. Regarding his stress depression and insomnia, he does feel like the Lexapro is helping somewhat but he still feels very fatigued. He has very little energy. He also feels very groggy during his workday but some of that could be lingering effects of hydroxyzine. He denies any suicidal ideation. He has no desire to change the Lexapro at this time. He declined sleep apnea testing. Past Medical History:  Diagnosis Date  . CAD (coronary artery disease)    a. inferior STEMI 5/13:  LHC prox-mid LAD 20-30%, mid LAD 70%, pOM 70%, pRCA 20%, mid 40%, AM Br 50-60%, dRCA 70% then 100%.  EF 55%.  PCI:  BMS to the distal RCA.;  b. inf STEMI (03/2013 at [facility]): LHC -  mLAD 60%, dCFX 70-80, mRCA 95, dRCA stent ok.  PCI:  Vision (2.5 x 15 mm) BMS to the mRCA.  Marland Kitchen DM2 (diabetes mellitus, type 2) (HCC)   . Dyslipidemia   . GERD (gastroesophageal reflux disease)   . Hx of echocardiogram    a. 2-D echocardiogram 09/02/11: Mild LVH, EF 55-60%, basal inferior HK, mild LAE, PASP 32.;   b. Echo (03/20/2013):  Mild TR, EF 50-55%, mild LVH.  Marland Kitchen Hyperlipidemia   . Myocardial infarction    2014 AND 2015  . Tobacco abuse    Past Surgical History:  Procedure Laterality Date  . LEFT HEART CATHETERIZATION WITH CORONARY ANGIOGRAM [name] 09/02/2011   Procedure: LEFT HEART CATHETERIZATION WITH CORONARY ANGIOGRAM;  Surgeon: Herby Abraham, MD;  Location[facility] CATH LAB;  Service: Cardiovascular;  Laterality: [name];  . PERCUTANEOUS CORONARY STENT INTERVENTION (PCI-S) Right 09/02/2011   Procedure: PERCUTANEOUS CORONARY STENT INTERVENTION (PCI-S);  Surgeon: Herby Abraham, MD;  Location: [facility] CATH LAB;  Service: Cardiovascular;  Laterality: Right;  . TUMOR REMOVAL  2012   Current Outpatient Prescriptions on File Prior to Visit  Medication Sig Dispense Refill  . ASPIR-LOW 81 MG EC tablet TAKE 1 TABLET (81 MG TOTAL) BY MOUTH DAILY. 30 tablet 1  . clopidogrel (PLAVIX) 75 MG  tablet Take 1 tablet (75 mg total) by mouth daily. 90 tablet 3  . Coenzyme Q10 200 MG capsule Take 1 capsule (200 mg total) by mouth daily.    Marland Kitchen escitalopram (LEXAPRO) 10 MG tablet Take 1 tablet (10 mg total) by mouth daily. 30 tablet 5  . furosemide (LASIX) 20 MG tablet Take 1 tablet (20 mg total) by mouth as needed for edema (feet and leg swelling). 90 tablet 3  . glipiZIDE (GLUCOTROL) 10 MG tablet TAKE 1 TABLET (10 MG TOTAL) BY MOUTH 2 (TWO) TIMES DAILY BEFORE A MEAL. 60 tablet PRN  . GLYXAMBI 25-5 MG TABS TAKE 1 TABLET BY MOUTH DAILY. 30 tablet PRN  . hydrOXYzine (VISTARIL) 25 MG capsule Take 2 capsules (50 mg total) by mouth 3 (three) times daily as needed (insomnia).  60 capsule 3  . lisinopril (PRINIVIL,ZESTRIL) 5 MG tablet TAKE 1 TABLET (5 MG TOTAL) BY MOUTH DAILY. 90 tablet 3  . metFORMIN (GLUCOPHAGE) 1000 MG tablet Take 1 tablet (1,000 mg total) by mouth 2 (two) times daily with a meal. 180 tablet 3  . metoprolol (LOPRESSOR) 50 MG tablet TAKE 1 TABLET (50 MG TOTAL) BY MOUTH 2 (TWO) TIMES DAILY. 180 tablet 3  . nitroGLYCERIN (NITROSTAT) 0.4 MG SL tablet Place 1 tablet (0.4 mg total) under the tongue every 5 (five) minutes x 3 doses as needed for chest pain. 25 tablet 4  . Omega-3 Fatty Acids (FISH OIL) 1200 MG CAPS Take by mouth daily. TAKE 1000 AND KRILL OIL 800    . pantoprazole (PROTONIX) 40 MG tablet TAKE 1 TABLET BY MOUTH DAILY. 90 tablet 3  . potassium chloride (K-DUR,KLOR-CON) 10 MEQ tablet One tablet daily as needed 90 tablet 3  . rosuvastatin (CRESTOR) 40 MG tablet TAKE 1 TABLET (20 MG TOTAL) BY MOUTH DAILY. 30 tablet 3  . sildenafil (VIAGRA) 50 MG tablet Take 1 tablet (50 mg total) by mouth daily as needed for erectile dysfunction. DO NOT TAKE NITROGLYCERIN WITHIN 24 HOURS OF TAKING VIAGRA 15 tablet 1  . Vitamin D, Cholecalciferol, 400 units TABS Take by mouth.     No current facility-administered medications on file prior to visit.    Allergies  Allergen Reactions  . Livalo  [Pitavastatin] Other (See Comments)    myalgias   Social History   Social History  . Marital status: Married    Spouse name: [name]  . Number of children: [name]  . Years of education: [name]   Occupational History  . Not on file.   Social History Main Topics  . Smoking status: Former Games developer  . Smokeless tobacco: Former Neurosurgeon    Quit date: 04/05/2011     Comment: quit in July  . Alcohol use 3.6 oz/week    3 Cans of beer, 3 Shots of liquor per week  . Drug use: No  . Sexual activity: Yes    Birth control/ protection: None   Other Topics Concern  . Not on file   Social History Narrative  . No narrative on file   Family History  Problem Relation Age of Onset  . Depression Mother   . Diabetes Mother   . Hypertension Mother   . Stroke Mother   . Arthritis Mother   . Hyperlipidemia Mother   . Learning disabilities Mother   . Mental illness Mother   . Diabetes Brother   . Heart attack Maternal Grandfather   . Hypertension Maternal Grandfather   . Heart failure Maternal Grandfather   . Hypertension Father   . Stroke Father   . Hypertension Brother      Review of Systems  Psychiatric/Behavioral: The patient has insomnia.   All other systems reviewed and are negative.      Objective:   Physical Exam  Constitutional: He is oriented to person, place, and time. He appears well-developed and well-nourished. No distress.  HENT:  Head: Normocephalic and atraumatic.  Right Ear: External ear normal.  Left Ear: External ear normal.  Nose: Nose normal.  Mouth/Throat: Oropharynx is clear and moist. No oropharyngeal exudate.  Eyes: Conjunctivae and EOM are normal. Pupils are equal, round, and reactive to light. Right eye exhibits no discharge. Left eye exhibits no discharge. No scleral icterus.  Neck: Normal range of motion. Neck supple. No JVD present. No tracheal deviation present. No thyromegaly present.  Cardiovascular: Normal rate, regular rhythm, normal heart sounds and intact  distal pulses.  Exam reveals no gallop and no friction rub.   No murmur heard. Pulmonary/Chest: Effort normal and breath sounds normal. No stridor. No respiratory distress. He has no wheezes. He has no rales. He exhibits no tenderness.  Abdominal: Soft. Bowel sounds are normal. He exhibits no distension and no mass. There is no tenderness. There is no rebound and no guarding.  Musculoskeletal: Normal range of motion. He exhibits no edema or tenderness.  Lymphadenopathy:    He has no cervical adenopathy.  Neurological: He is alert and oriented to person, place, and time. He has normal reflexes. No cranial nerve deficit. He exhibits normal muscle tone. Coordination normal.  Skin: Skin is warm. No rash noted. He is not diaphoretic. No erythema. No pallor.  Psychiatric: He has a normal mood and affect. His behavior is normal. Judgment and thought content normal.  Vitals reviewed.         Assessment & Plan:  Chronic fatigue - Plan: Testosterone  Situational stress  Primary insomnia  Patient is scheduled to follow-up in 3 months after adding metformin to better control his sugars and also taking the Crestor on a daily basis. Continue Lexapro 10 mg a day. Decrease the use of hydroxyzine to see if hypersomnolence during his workday will improve. Suggest a sleep apnea testing but the patient declines. Given his chronic fatigue, I would also check a testosterone level to rule out hypogonadism.

## 2016-01-22 LAB — TESTOSTERONE: TESTOSTERONE: 178 ng/dL — AB (ref 250–827)

## 2016-01-26 ENCOUNTER — Encounter: Payer: Self-pay | Admitting: Family Medicine

## 2016-02-02 ENCOUNTER — Encounter: Payer: Self-pay | Admitting: Family Medicine

## 2016-02-02 ENCOUNTER — Telehealth: Payer: Self-pay | Admitting: Cardiology

## 2016-02-02 ENCOUNTER — Ambulatory Visit (INDEPENDENT_AMBULATORY_CARE_PROVIDER_SITE_OTHER): Payer: 59 | Admitting: Family Medicine

## 2016-02-02 VITALS — BP 110/82 | HR 87 | Temp 97.8°F | Resp 18 | Ht 74.0 in | Wt 234.0 lb

## 2016-02-02 DIAGNOSIS — E291 Testicular hypofunction: Secondary | ICD-10-CM

## 2016-02-02 DIAGNOSIS — R5382 Chronic fatigue, unspecified: Secondary | ICD-10-CM | POA: Diagnosis not present

## 2016-02-02 NOTE — Telephone Encounter (Signed)
Pt states he recently had a testosterone level and it was low. Testosterone injections were recommended. Pt asking if okay with Dr Shirlee Latch to do testosterone injections with his diagnosis of CAD. Pt advised I will forward to Dr Shirlee Latch for review.

## 2016-02-02 NOTE — Progress Notes (Signed)
Subjective:    Patient ID: Derrick Todd, male    DOB: July 06, 1960, 55 y.o.   MRN: [id number]  Insomnia     06/2015  Patient is here today to establish care. He is a very pleasant 55 year old African-American male. He works as a Engineer, civil (consulting) at [employer]. Past medical history is significant for non-ST elevation myocardial infarction in 2013 with bare metal stent placed in the right coronary artery. In 2014 he had a ST elevation myocardial infarction with another bare metal stent placed in the right coronary artery. He is on chronic dual antiplatelet therapy. He also has a history of hyperlipidemia, diabetes mellitus type 2 which is poorly controlled.  Patient has had Pneumovax 23. He declines Prevnar 13 today. His last colonoscopy was in 2007. He is due this year but he would like to wait till later this year. He is also due for prostate exam/PSA. His flu shot is up-to-date. His diabetic eye exam is up-to-date. Diabetic foot exam is performed today and is normal. He does have chronic venous stasis changes in both legs with some mild pitting edema and varicosities.  At that time, my plan was: Patient is a very pleasant 55 year old African-American male. He is a real likable guy but I do not think he is been taking care of himself the way he should. He acknowledges this. We will check a hemoglobin A1c today. Given his history of coronary artery disease, if his hemoglobin A1c is elevated, I would recommend adding jardiance given its recent study showing reduction in mortality for patients with a history of coronary artery disease. I will also check a fasting lipid panel. His goal LDL cholesterol is less than 70. I will also check a microalbumin as well as a PSA for prostate cancer screening. His blood pressures acceptable. Diabetic eye exam is up-to-date. Diabetic foot exam is performed today and is normal. Also the patient hepatitis C and HIV screening but he politely declined. He is due for colonoscopy but he  would prefer to defer this to later this year. He can call me when he is ready for me to schedule this and will  12/21/15 Patient has not been seen since that time. He is overdue to follow-up for his diabetes. However his biggest concern today and the reason he made his appointment is insomnia. Over the last 5 months, the patient has been battling depression. The majority of his problems stem from trouble he is having at work. 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 He does report depression and anhedonia as well as insomnia. He works 7 PM to 7 AM he goes home in the morning, lies down, and finds himself unable to fall asleep. He states it's been weeks since he got a decent rest. Therefore he is extremely tired. He is falling asleep occasionally at work. He was trying to transfer out of the unit but has been unsuccessful as of now. He denies any suicidal ideation, hallucinations, delusions.  At that time, my plan was: Begin Lexapro 10 mg by mouth daily for depression. Use hydroxyzine 25 mg to 50 mg by mouth daily at bedtime as needed for insomnia. Recheck in one month. While I have the patient here I'm also going to check a hemoglobin A1c as well as a fasting lipid panel to monitor the control of his chronic medical problems.  01/21/16 He  is here today for follow-up. His last office visit, his LDL cholesterol was found to be greater than 200!Marland Kitchen. He states that he has not been regular in taking his Crestor. He states that he takes less than 50% of the time due to myalgias in his legs. He has a significant history of coronary artery disease status post myocardial infarction on 2 separate occasions. I explained in great detail how high his cholesterol is and how it is necessary for us to control this to help prevent future heart attacks. He would like to try to take the medication on a  daily basis and then recheck his cholesterol in 3 months. Regarding his stress depression and insomnia, he does feel like the Lexapro is helping somewhat but he still feels very fatigued. He has very little energy. He also feels very groggy during his workday but some of that could be lingering effects of hydroxyzine. He denies any suicidal ideation. He has no desire to change the Lexapro at this time. He declined sleep apnea testing.  At that time, my plan was: Patient is scheduled to follow-up in 3 months after adding metformin to better control his sugars and also taking the Crestor on a daily basis. Continue Lexapro 10 mg a day. Decrease the use of hydroxyzine to see if hypersomnolence during his workday will improve. Suggest a sleep apnea testing but the patient declines. Given his chronic fatigue, I would also check a testosterone level to rule out hypogonadism.  02/02/16 Testosterone was 178 ng/dL. he continues to report fatigue, apathy, decreased energy. Depression is better on Lexapro but the fatigue is not. He is here today to discuss treatment options Past Medical History:  Diagnosis Date  . CAD (coronary artery disease)    a. inferior STEMI 5/13:  LHC prox-mid LAD 20-30%, mid LAD 70%, pOM 70%, pRCA 20%, mid 40%, AM Br 50-60%, dRCA 70% then 100%.  EF 55%.  PCI:  BMS to the distal RCA.;  b. inf [facility] (03/[facility] LHC -  mLAD 60%, dCFX 70-80, mRCA 95, dRCA stent ok.  PCI:  Vision (2.5 x 15 mm) BMS to the mRCA.  Marland Kitchen. DM2 (diabetes mellitus, type 2) (HCC)   . Dyslipidemia   . GERD (gastroesophageal reflux disease)   . Hx of echocardiogram    a. 2-D echocardiogram 09/02/11: Mild LVH, EF 55-60%, basal inferior HK, mild LAE, PASP 32.;   b. Echo (03/20/2013):  Mild TR, EF 50-55%, mild LVH.  Marland Kitchen. Hyperlipidemia   . Myocardial infarction    2014 AND 2015  . Tobacco abuse    Past Surgical History:  Procedure Laterality Date  . LEFT HEART CATHETERIZATION WITH CORONARY ANGIOGRAM [name]  09/02/2011   Procedure: LEFT HEART CATHETERIZATION WITH CORONARY ANGIOGRAM;  Surgeon: Herby Abrahamhomas D Stuckey, MD;  [facility];  Service: Cardiovascular;  Laterality: [name];  . PERCUTANEOUS CORONARY STENT INTERVENTION (PCI-S) Right 09/02/2011   Procedure: PERCUTANEOUS CORONARY STENT INTERVENTION (PCI-S);  Surgeon: Herby Abrahamhomas D Stuckey, MD;  [facility];  Service: Cardiovascular;  Laterality: Right;  . TUMOR REMOVAL  2012   Current Outpatient Prescriptions on File Prior to Visit  Medication Sig Dispense Refill  . ASPIR-LOW 81 MG EC tablet TAKE 1 TABLET (81 MG TOTAL) BY MOUTH DAILY. 30 tablet 1  . clopidogrel (PLAVIX) 75 MG tablet Take 1 tablet (75 mg total) by mouth daily. 90 tablet 3  . Coenzyme Q10 200 MG capsule Take 1 capsule (200 mg total) by mouth daily.    .Marland Kitchen  escitalopram (LEXAPRO) 10 MG tablet Take 1 tablet (10 mg total) by mouth daily. 30 tablet 5  . furosemide (LASIX) 20 MG tablet Take 1 tablet (20 mg total) by mouth as needed for edema (feet and leg swelling). 90 tablet 3  . glipiZIDE (GLUCOTROL) 10 MG tablet TAKE 1 TABLET (10 MG TOTAL) BY MOUTH 2 (TWO) TIMES DAILY BEFORE A MEAL. 60 tablet PRN  . GLYXAMBI 25-5 MG TABS TAKE 1 TABLET BY MOUTH DAILY. 30 tablet PRN  . hydrOXYzine (VISTARIL) 25 MG capsule Take 2 capsules (50 mg total) by mouth 3 (three) times daily as needed (insomnia). 60 capsule 3  . lisinopril (PRINIVIL,ZESTRIL) 5 MG tablet TAKE 1 TABLET (5 MG TOTAL) BY MOUTH DAILY. 90 tablet 3  . magnesium oxide (MAG-OX) 400 MG tablet Take 400 mg by mouth daily.    . metFORMIN (GLUCOPHAGE) 1000 MG tablet Take 1 tablet (1,000 mg total) by mouth 2 (two) times daily with a meal. 180 tablet 3  . metoprolol (LOPRESSOR) 50 MG tablet TAKE 1 TABLET (50 MG TOTAL) BY MOUTH 2 (TWO) TIMES DAILY. 180 tablet 3  . nitroGLYCERIN (NITROSTAT) 0.4 MG SL tablet Place 1 tablet (0.4 mg total) under the tongue every 5 (five) minutes x 3 doses as needed for chest pain. 25 tablet 4  . Omega-3 Fatty Acids  (FISH OIL) 1200 MG CAPS Take by mouth daily. TAKE 1000 AND KRILL OIL 800    . pantoprazole (PROTONIX) 40 MG tablet TAKE 1 TABLET BY MOUTH DAILY. 90 tablet 3  . potassium chloride (K-DUR,KLOR-CON) 10 MEQ tablet One tablet daily as needed 90 tablet 3  . rosuvastatin (CRESTOR) 40 MG tablet TAKE 1 TABLET (20 MG TOTAL) BY MOUTH DAILY. 30 tablet 3  . sildenafil (VIAGRA) 50 MG tablet Take 1 tablet (50 mg total) by mouth daily as needed for erectile dysfunction. DO NOT TAKE NITROGLYCERIN WITHIN 24 HOURS OF TAKING VIAGRA 15 tablet 1  . Vitamin D, Cholecalciferol, 400 units TABS Take by mouth.     No current facility-administered medications on file prior to visit.    Allergies  Allergen Reactions  . Livalo [Pitavastatin] Other (See Comments)    myalgias   Social History   Social History  . Marital status: Married    Spouse name: [name]  . Number of children: [name]  . Years of education: [name]   Occupational History  . Not on file.   Social History Main Topics  . Smoking status: Former Games developer  . Smokeless tobacco: Former Neurosurgeon    Quit date: 04/05/2011     Comment: quit in July  . Alcohol use 3.6 oz/week    3 Cans of beer, 3 Shots of liquor per week  . Drug use: No  . Sexual activity: Yes    Birth control/ protection: None   Other Topics Concern  . Not on file   Social History Narrative  . No narrative on file   Family History  Problem Relation Age of Onset  . Depression Mother   . Diabetes Mother   . Hypertension Mother   . Stroke Mother   . Arthritis Mother   . Hyperlipidemia Mother   . Learning disabilities Mother   . Mental illness Mother   . Diabetes Brother   . Heart attack Maternal Grandfather   . Hypertension Maternal Grandfather   . Heart failure Maternal Grandfather   . Hypertension Father   . Stroke Father   . Hypertension Brother      Review of Systems  Psychiatric/Behavioral: The patient has insomnia.   All other systems reviewed and are negative.        Objective:   Physical Exam  Constitutional: He is oriented to person, place, and time. He appears well-developed and well-nourished. No distress.  HENT:  Head: Normocephalic and atraumatic.  Right Ear: External ear normal.  Left Ear: External ear normal.  Nose: Nose normal.  Mouth/Throat: Oropharynx is clear and moist. No oropharyngeal exudate.  Eyes: Conjunctivae and EOM are normal. Pupils are equal, round, and reactive to light. Right eye exhibits no discharge. Left eye exhibits no discharge. No scleral icterus.  Neck: Normal range of motion. Neck supple. No JVD present. No tracheal deviation present. No thyromegaly present.  Cardiovascular: Normal rate, regular rhythm, normal heart sounds and intact distal pulses.  Exam reveals no gallop and no friction rub.   No murmur heard. Pulmonary/Chest: Effort normal and breath sounds normal. No stridor. No respiratory distress. He has no wheezes. He has no rales. He exhibits no tenderness.  Abdominal: Soft. Bowel sounds are normal. He exhibits no distension and no mass. There is no tenderness. There is no rebound and no guarding.  Musculoskeletal: Normal range of motion. He exhibits no edema or tenderness.  Lymphadenopathy:    He has no cervical adenopathy.  Neurological: He is alert and oriented to person, place, and time. He has normal reflexes. No cranial nerve deficit. He exhibits normal muscle tone. Coordination normal.  Skin: Skin is warm. No rash noted. He is not diaphoretic. No erythema. No pallor.  Psychiatric: He has a normal mood and affect. His behavior is normal. Judgment and thought content normal.  Vitals reviewed.         Assessment & Plan:  Chronic fatigue  Hypogonadism in male - Plan: Testosterone, Prolactin, Follicle stimulating hormone, Luteinizing hormone, PSA  Patient appears to have hypogonadism. Check FSH LH, and prolactin level. Also get baseline PSA and repeat testosterone. If patient is confirmed to have  hypogonadism, begin testosterone replacement. Usually I would prescribe testosterone cypionate 200 mg IM every 2 weeks unless his insurance will cover topical therapy

## 2016-02-02 NOTE — Telephone Encounter (Signed)
If testosterone levels were clearly low, can use injections to raise them to normal range.

## 2016-02-02 NOTE — Telephone Encounter (Signed)
New Message  Pt voiced wanting to speak to nurse about scheduling for [facility] and about pts condition.  Please f/u

## 2016-02-03 ENCOUNTER — Encounter: Payer: Self-pay | Admitting: Cardiology

## 2016-02-03 LAB — TESTOSTERONE: Testosterone: 166 ng/dL — ABNORMAL LOW (ref 250–827)

## 2016-02-03 LAB — LUTEINIZING HORMONE: LH: 5.7 m[IU]/mL (ref 1.5–9.3)

## 2016-02-03 LAB — PSA: PSA: 0.2 ng/mL (ref <=4.0)

## 2016-02-03 LAB — PROLACTIN: PROLACTIN: 9.1 ng/mL (ref 2.0–18.0)

## 2016-02-03 LAB — FOLLICLE STIMULATING HORMONE: FSH: 5.9 m[IU]/mL (ref 1.6–8.0)

## 2016-02-04 NOTE — Telephone Encounter (Signed)
LMTCB

## 2016-02-04 NOTE — Telephone Encounter (Signed)
I spoke to patient and advised him of recommendations in regards to testosterone.  He also requested I sch 6 mo f/u appt with Dr. Shirlee Latch for December. I advised him Dr. Shirlee Latch would not be here in Dec and sch appt w/ Dr. Okey Dupre on 12/7. He voiced concern about changing physicians, because he likes Dr. Shirlee Latch. I advised him about Dr. Alford Highland change to [facility] and Dr. Okey Dupre would take excellent care of him.  If for some reason he was not comfortable with Dr. Okey Dupre we could make other arrangements for cardiac care. He voiced understanding, thanks, and agreed with the plan.

## 2016-02-08 ENCOUNTER — Encounter: Payer: Self-pay | Admitting: Family Medicine

## 2016-02-09 ENCOUNTER — Telehealth: Payer: Self-pay | Admitting: Family Medicine

## 2016-02-09 NOTE — Telephone Encounter (Signed)
PA submitted through [email or web address]  

## 2016-02-12 ENCOUNTER — Encounter: Payer: Self-pay | Admitting: *Deleted

## 2016-02-12 NOTE — Telephone Encounter (Signed)
Received denial for Androgel.   Per denial, authorization requires (2) pre-treatment serum testosterone levels less that 280 ng/dL.   Pre-treatment levels: 01/21/2016: 178 ng/dL 95/12/3265: 124 ng/d  Appeal faxed to [facility]- [phone]- [phone]~ telephone/ [phone]~ fax.

## 2016-02-16 NOTE — Telephone Encounter (Signed)
Received additional paperwork from appeals - faxed back to [facility]

## 2016-02-17 MED ORDER — TESTOSTERONE 20.25 MG/1.25GM (1.62%) TD GEL: TRANSDERMAL | 5 refills | Status: DC

## 2016-02-17 MED FILL — ANDROGEL 1.62% GEL PUMP: 20.25 MG/AC | 30 days supply | Qty: 75 | Fill #0

## 2016-02-17 NOTE — Telephone Encounter (Signed)
Approved through 08/19/16 - pt aware and med called to pharm and pt aware.

## 2016-03-04 MED FILL — GLYXAMBI 25 MG-5 MG TABLET: 25-5 | 30 days supply | Qty: 30 | Fill #1

## 2016-03-10 ENCOUNTER — Ambulatory Visit: Payer: 59 | Admitting: Internal Medicine

## 2016-03-16 ENCOUNTER — Encounter: Payer: Self-pay | Admitting: Cardiology

## 2016-03-17 ENCOUNTER — Encounter: Payer: Self-pay | Admitting: Cardiology

## 2016-03-17 ENCOUNTER — Ambulatory Visit (INDEPENDENT_AMBULATORY_CARE_PROVIDER_SITE_OTHER): Payer: 59 | Admitting: Cardiology

## 2016-03-17 VITALS — BP 122/64 | HR 90 | Ht 74.0 in | Wt 235.0 lb

## 2016-03-17 DIAGNOSIS — E785 Hyperlipidemia, unspecified: Secondary | ICD-10-CM

## 2016-03-17 DIAGNOSIS — I2510 Atherosclerotic heart disease of native coronary artery without angina pectoris: Secondary | ICD-10-CM | POA: Diagnosis not present

## 2016-03-17 LAB — HEPATIC FUNCTION PANEL
ALK PHOS: 53 U/L (ref 40–115)
ALT: 23 U/L (ref 9–46)
AST: 20 U/L (ref 10–35)
Albumin: 4.4 g/dL (ref 3.6–5.1)
BILIRUBIN DIRECT: 0.2 mg/dL (ref <=0.2)
BILIRUBIN INDIRECT: 0.6 mg/dL (ref 0.2–1.2)
Total Bilirubin: 0.8 mg/dL (ref 0.2–1.2)
Total Protein: 7 g/dL (ref 6.1–8.1)

## 2016-03-17 LAB — LIPID PANEL
CHOLESTEROL: 139 mg/dL (ref <200)
HDL: 40 mg/dL — ABNORMAL LOW (ref >40)
LDL Cholesterol: 71 mg/dL (ref <100)
Total CHOL/HDL Ratio: 3.5 Ratio (ref <5.0)
Triglycerides: 139 mg/dL (ref <150)
VLDL: 28 mg/dL (ref <30)

## 2016-03-17 NOTE — Patient Instructions (Addendum)
Medication Instructions:  Your physician recommends that you continue on your current medications as directed. Please refer to the Current Medication list given to you today.   Labwork: Lipid profile/liver profile today  Testing/Procedures: Your physician has requested that you have a lexiscan myoview. For further information please visit [email or web address] Please follow instruction sheet, as given.    Follow-Up: Your physician wants you to follow-up in: 1 year in the [facility]. (December 2018). [phone] You will receive a reminder letter in the mail two months in advance. If you don't receive a letter, please call our office to schedule the follow-up appointment.        If you need a refill on your cardiac medications before your next appointment, please call your pharmacy.

## 2016-03-17 NOTE — Progress Notes (Signed)
Patient ID: Derrick Todd, male   DOB: 06/08/1960, 55 y.o.   MRN: [id number] PCP: Dr. Pickard  55 yo with history of CAD s/p NSTEMI in 5/13 and inferior STEMI in 12/14 presents for followup.  In 5/13, he had an NSTEMI with BMS to totally occluded distal RCA.  In 12/14, he had an inferior STEMI with 95% mid RCA (distal RCA stent patent) at [facility].  He had BMS to mRCA.  Echo (12/14) showed EF 50-55%.  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  He quit smoking in 7/14.  Echo (6/15) showed EF 55-60% with basal to mid inferolateral hypokinesis (no significant change from prior).   He had myalgias with atorvastatin and was actually off his statin when he had the 12/14 MI.  He has since started Crestor but then stopped due to myalgias.  LDL was very high in 9/17 so he restarted Crestor and is now taking 20 mg daily.  He is currently not having myalgias.  No exertional dyspnea. Normally, he has not been having chest pain.  However, about 2 weeks ago, he had substernal chest tightness while raking leaves.  This resolved with NTG.  He is now working in the [employer] ER.    ECG: NSR, old inferior MI.   Labs (5/13): LDL 122, HDL 32 Labs (6/13): K 3.9, creatinine 1.0 Labs (12/14): K 3.8, creatinine 1.0, LDL 153 Labs (3/15): LDL 88, HDL 43, hgbA1c 9.2 Labs (6/15): K 4, creatinine 1.0 Labs (9/17): LDL 212, hgb 14.2  PMH: 1. CAD: NSTEMI 5/13 with LHC showing 70% OM, 70% mLAD, total occlusion distal RCA, EF 55%.  Had 2 x 18 BMS to distal RCA.  Echo (5/13): EF 55-60% with basal inferior hypokinesis.  Inferior STEMI (12/14).  LHC showed 60% mLAD, 60-70% dLCx, 95% mRCA, patent dRCA stent.  Patient had BMS to mRCA.  Echo (12/14) with EF 50-55% ([facility]).  Echo (6/15) with EF 55-60%, basal to mid inferolateral hypokinesis.  2. Hyperlipidemia: myalgias with atorvastatin. Myalgias with Crestor but more manageable.  3. Type II diabetes 4. Erectile dysfunction 5. 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  Wife is nurse working for [employer].  2 daughters.  Quit smoking in 7/14.   FH: CAD  ROS: All systems reviewed and negative except as per HPI.   Current Outpatient Prescriptions  Medication Sig Dispense Refill  . ASPIR-LOW 81 MG EC tablet TAKE 1 TABLET (81 MG TOTAL) BY MOUTH DAILY. 30 tablet 1  . clopidogrel (PLAVIX) 75 MG tablet Take 1 tablet (75 mg total) by mouth daily. 90 tablet 3  . Coenzyme Q10 200 MG capsule Take 1 capsule (200 mg total) by mouth daily.    . furosemide (LASIX) 20 MG tablet Take 1 tablet (20 mg total) by mouth as needed for edema (feet and leg swelling). 90 tablet 3  . glipiZIDE (GLUCOTROL) 10 MG tablet TAKE 1 TABLET (10 MG TOTAL) BY MOUTH 2 (TWO) TIMES DAILY BEFORE A MEAL. 60 tablet PRN  . GLYXAMBI 25-5 MG TABS TAKE 1 TABLET BY MOUTH DAILY. 30 tablet PRN  . hydrOXYzine (VISTARIL) 25 MG capsule Take 2 capsules (50 mg total) by mouth 3 (three) times daily as needed (insomnia). (Patient taking differently: Take 50 mg by mouth at bedtime as needed (insomnia). ) 60 capsule 3  . lisinopril (PRINIVIL,ZESTRIL) 5 MG tablet TAKE 1 TABLET (5 MG TOTAL) BY MOUTH DAILY. 90 tablet 3  .   magnesium oxide (MAG-OX) 400 MG tablet Take 400 mg by mouth daily.    . metFORMIN (GLUCOPHAGE) 1000 MG tablet Take 1 tablet (1,000 mg total) by mouth 2 (two) times daily with a meal. 180 tablet 3  . metoprolol (LOPRESSOR) 50 MG tablet TAKE 1 TABLET (50 MG TOTAL) BY MOUTH 2 (TWO) TIMES DAILY. 180 tablet 3  . nitroGLYCERIN (NITROSTAT) 0.4 MG SL tablet Place 1 tablet (0.4 mg total) under the tongue every 5 (five) minutes x 3 doses as needed for chest pain. 25 tablet 4  . Omega-3 Fatty Acids (FISH OIL) 1200 MG CAPS Take by mouth daily. TAKE 1000 AND KRILL OIL 800    . pantoprazole (PROTONIX) 40 MG tablet TAKE 1 TABLET BY MOUTH DAILY. 90 tablet 3  . potassium chloride (K-DUR,KLOR-CON) 10 MEQ tablet One tablet daily as needed 90 tablet 3  .  rosuvastatin (CRESTOR) 40 MG tablet TAKE 1 TABLET (20 MG TOTAL) BY MOUTH DAILY. 30 tablet 3  . sildenafil (VIAGRA) 50 MG tablet Take 1 tablet (50 mg total) by mouth daily as needed for erectile dysfunction. DO NOT TAKE NITROGLYCERIN WITHIN 24 HOURS OF TAKING VIAGRA 15 tablet 1  . Testosterone 20.25 MG/1.25GM (1.62%) GEL 2 pumps daily as per instructions 1.25 g 5  . Vitamin D, Cholecalciferol, 400 units TABS Take by mouth.    . escitalopram (LEXAPRO) 10 MG tablet Take 1 tablet (10 mg total) by mouth daily. (Patient not taking: Reported on 03/17/2016) 30 tablet 5   No current facility-administered medications for this visit.     BP 122/64   Pulse 90   Ht 6\' 2"  (1.88 m)   Wt 235 lb (106.6 kg)   BMI 30.17 kg/m  General: NAD Neck: JVP 7 cm, no thyromegaly or thyroid nodule.  Lungs: Clear to auscultation bilaterally with normal respiratory effort. CV: Nondisplaced PMI.  Heart regular S1/S2, +S4, no murmur.  No edema.  No carotid bruit.  Normal pedal pulses.  Abdomen: Soft, nontender, no hepatosplenomegaly, no distention.  Neurologic: Alert and oriented x 3.  Psych: Normal affect. Extremities: No clubbing or cyanosis.   Assessment/Plan: 1. CAD: He had an episode of suspected anginal chest pain about 2 wks ago, resolving with NTG.    - Continue ASA 81, Plavix, statin, lisinopril, metoprolol.  I think that he should continue Plavix long-term as long as he has no bleeding side effects (DAPT score = 4). - Given chest pain, will risk stratify with Lexiscan Cardiolite.  2. Hyperlipidemia:  LDL was very high in 9/17 off Crestor.  He is now back on Crestor 20 mg daily without myalgias.  Need lipids/LFTs checked today.  If LDL remains elevated, will increase Crestor with next step then add Zetia if needed.  Need to be aggressive with lipids.  3. Erectile dysfunction: He can take Viagra but was cautioned about the risk of hypotension if NTG is taken while Viagra is in the system.   Followup in 1 year  if Cardiolite is low risk.  [facility] will [facility].    Marca Ancona 03/17/2016

## 2016-03-21 ENCOUNTER — Telehealth (HOSPITAL_COMMUNITY): Payer: Self-pay | Admitting: *Deleted

## 2016-03-21 NOTE — Telephone Encounter (Signed)
Left message on voicemail per DPR in reference to upcoming appointment scheduled on 03/23/16 at 0745 with detailed instructions given per Myocardial Perfusion Study Information Sheet for the test. LM to arrive 15 minutes early, and that it is imperative to arrive on time for appointment to keep from having the test rescheduled. If you need to cancel or reschedule your appointment, please call the office within 24 hours of your appointment. Failure to do so may result in a cancellation of your appointment, and a $50 no show fee. Phone number given for call back for any questions.

## 2016-03-23 ENCOUNTER — Ambulatory Visit (HOSPITAL_COMMUNITY): Payer: 59 | Attending: Internal Medicine

## 2016-03-23 DIAGNOSIS — I2510 Atherosclerotic heart disease of native coronary artery without angina pectoris: Secondary | ICD-10-CM | POA: Insufficient documentation

## 2016-03-23 DIAGNOSIS — E785 Hyperlipidemia, unspecified: Secondary | ICD-10-CM | POA: Diagnosis not present

## 2016-03-23 DIAGNOSIS — R9439 Abnormal result of other cardiovascular function study: Secondary | ICD-10-CM | POA: Insufficient documentation

## 2016-03-23 DIAGNOSIS — R0789 Other chest pain: Secondary | ICD-10-CM | POA: Insufficient documentation

## 2016-03-23 DIAGNOSIS — E119 Type 2 diabetes mellitus without complications: Secondary | ICD-10-CM | POA: Insufficient documentation

## 2016-03-23 LAB — MYOCARDIAL PERFUSION IMAGING
CHL CUP NUCLEAR SRS: 3
CHL CUP NUCLEAR SSS: 5
LHR: 0.36
LV dias vol: 158 mL (ref 62–150)
LV sys vol: 88 mL
NUC STRESS TID: 1.1
Peak HR: 93 {beats}/min
Rest HR: 89 {beats}/min
SDS: 2

## 2016-03-23 MED ORDER — REGADENOSON 0.4 MG/5ML IV SOLN
0.4000 mg | Freq: Once | INTRAVENOUS | Status: AC
  Administered 2016-03-23: 0.4 mg via INTRAVENOUS

## 2016-03-23 MED ORDER — TECHNETIUM TC 99M TETROFOSMIN IV KIT
10.8000 | PACK | Freq: Once | INTRAVENOUS | Status: AC | PRN
  Administered 2016-03-23: 10.8 via INTRAVENOUS
  Filled 2016-03-23: qty 11

## 2016-03-23 MED ORDER — TECHNETIUM TC 99M TETROFOSMIN IV KIT
32.3000 | PACK | Freq: Once | INTRAVENOUS | Status: AC | PRN
  Administered 2016-03-23: 32.3 via INTRAVENOUS
  Filled 2016-03-23: qty 33

## 2016-03-24 MED FILL — GLYXAMBI 25 MG-5 MG TABLET: 25-5 | 30 days supply | Qty: 30 | Fill #2

## 2016-03-24 MED FILL — ANDROGEL 1.62% GEL PUMP: 20.25 MG/AC | 30 days supply | Qty: 75 | Fill #1

## 2016-03-25 ENCOUNTER — Telehealth: Payer: Self-pay | Admitting: Cardiology

## 2016-03-25 NOTE — Telephone Encounter (Signed)
New message ° °Pt is returning call  ° °Please call back °

## 2016-03-25 NOTE — Telephone Encounter (Signed)
Derrick Morale, MD  [facility] Triage Cc: Jacqlyn Krauss, RN; Dossie Arbour, RN        High risk study with concern for ischemia, possible multivessel disease, and low EF. If symptoms stable, please arrange for cath for me when I'm back from Christmas vacation [name] can help coordinate an appropriate day). If symptoms not stable, needs cath this week.    Spoke with pt, aware of results. He reports he has not had chest pain, SOB or fatigue in 3 weeks. He would like dr Shirlee Latch to preform his cath. I tried to call the [facility] to find out about scheduling his cath but no one answered. Explained to patient it will be the first of the week before we can schedule procedure. Patient voiced understanding to go to the ER if he were to have symptoms.

## 2016-03-29 NOTE — Telephone Encounter (Signed)
Patient is very concerned about his stress test results. Patient has a history of CAD and cath with stents. Patient stated he did have some chest heaviness. Encouraged patient to have cath done sooner than later. Since patient is having symptoms, scheduled cath for tomorrow with Dr. Excell Seltzer. Patient would like to talk to a doctor before he has the cath, because he has multiple questions. 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 Patient is fine having heart cath tomorrow, but he would just feel better if he talked to the doctor. Will forward to Dr. Excell Seltzer, since he is scheduled to do the procedure tomorrow. Informed patient that Dr. Excell Seltzer would be calling him later today. Went over instructions for heart cath procedure, and will change date and time if need be. Patient agreed to plan and will await a call from the doctor.

## 2016-03-29 NOTE — Telephone Encounter (Signed)
Follow Up:    Pt says he is very anxious,he wants to get his Cath scheduled asap. He would like somebody to call him today and let him know something please.

## 2016-03-29 NOTE — Telephone Encounter (Signed)
Dr Excell Seltzer contacted the pt by phone and answered questions.

## 2016-03-30 ENCOUNTER — Encounter (HOSPITAL_COMMUNITY)
Admission: RE | Disposition: A | Discharge status: Home / Self Care | Payer: Self-pay | Source: Ambulatory Visit | Attending: Cardiovascular Disease

## 2016-03-30 ENCOUNTER — Encounter (HOSPITAL_COMMUNITY): Payer: Self-pay | Admitting: Cardiovascular Disease

## 2016-03-30 ENCOUNTER — Other Ambulatory Visit: Payer: Self-pay

## 2016-03-30 ENCOUNTER — Ambulatory Visit (HOSPITAL_COMMUNITY)
Admission: RE | Admit: 2016-03-30 | Discharge: 2016-03-30 | Disposition: A | Discharge status: Home / Self Care | Payer: 59 | Source: Ambulatory Visit | Attending: Cardiovascular Disease | Admitting: Cardiovascular Disease

## 2016-03-30 DIAGNOSIS — Z955 Presence of coronary angioplasty implant and graft: Secondary | ICD-10-CM | POA: Diagnosis not present

## 2016-03-30 DIAGNOSIS — Z8249 Family history of ischemic heart disease and other diseases of the circulatory system: Secondary | ICD-10-CM | POA: Insufficient documentation

## 2016-03-30 DIAGNOSIS — N529 Male erectile dysfunction, unspecified: Secondary | ICD-10-CM | POA: Diagnosis not present

## 2016-03-30 DIAGNOSIS — R931 Abnormal findings on diagnostic imaging of heart and coronary circulation: Secondary | ICD-10-CM | POA: Diagnosis present

## 2016-03-30 DIAGNOSIS — Z7902 Long term (current) use of antithrombotics/antiplatelets: Secondary | ICD-10-CM | POA: Diagnosis not present

## 2016-03-30 DIAGNOSIS — E785 Hyperlipidemia, unspecified: Secondary | ICD-10-CM | POA: Diagnosis not present

## 2016-03-30 DIAGNOSIS — Z7984 Long term (current) use of oral hypoglycemic drugs: Secondary | ICD-10-CM | POA: Diagnosis not present

## 2016-03-30 DIAGNOSIS — I252 Old myocardial infarction: Secondary | ICD-10-CM | POA: Insufficient documentation

## 2016-03-30 DIAGNOSIS — Z87891 Personal history of nicotine dependence: Secondary | ICD-10-CM | POA: Insufficient documentation

## 2016-03-30 DIAGNOSIS — I2510 Atherosclerotic heart disease of native coronary artery without angina pectoris: Secondary | ICD-10-CM

## 2016-03-30 DIAGNOSIS — E119 Type 2 diabetes mellitus without complications: Secondary | ICD-10-CM | POA: Insufficient documentation

## 2016-03-30 HISTORY — DX: Abnormal findings on diagnostic imaging of heart and coronary circulation: R93.1

## 2016-03-30 HISTORY — PX: CARDIAC CATHETERIZATION: SHX172

## 2016-03-30 LAB — CBC
HEMATOCRIT: 38.7 % — AB (ref 39.0–52.0)
HEMOGLOBIN: 12.9 g/dL — AB (ref 13.0–17.0)
MCH: 24.2 pg — AB (ref 26.0–34.0)
MCHC: 33.3 g/dL (ref 30.0–36.0)
MCV: 72.6 fL — AB (ref 78.0–100.0)
Platelets: 235 10*3/uL (ref 150–400)
RBC: 5.33 MIL/uL (ref 4.22–5.81)
RDW: 15.1 % (ref 11.5–15.5)
WBC: 4.6 10*3/uL (ref 4.0–10.5)

## 2016-03-30 LAB — PROTIME-INR
INR: 0.99
PROTHROMBIN TIME: 13.1 s (ref 11.4–15.2)

## 2016-03-30 LAB — BASIC METABOLIC PANEL
ANION GAP: 8 (ref 5–15)
BUN: 17 mg/dL (ref 6–20)
CALCIUM: 9.3 mg/dL (ref 8.9–10.3)
CHLORIDE: 104 mmol/L (ref 101–111)
CO2: 24 mmol/L (ref 22–32)
Creatinine, Ser: 1.01 mg/dL (ref 0.61–1.24)
GFR calc non Af Amer: >60 mL/min (ref >60)
GLUCOSE: 98 mg/dL (ref 65–99)
POTASSIUM: 4 mmol/L (ref 3.5–5.1)
Sodium: 136 mmol/L (ref 135–145)

## 2016-03-30 LAB — POCT ACTIVATED CLOTTING TIME: ACTIVATED CLOTTING TIME: 318 s

## 2016-03-30 LAB — GLUCOSE, CAPILLARY
GLUCOSE-CAPILLARY: 284 mg/dL — AB (ref 65–99)
Glucose-Capillary: 93 mg/dL (ref 65–99)

## 2016-03-30 SURGERY — LEFT HEART CATH AND CORONARY ANGIOGRAPHY

## 2016-03-30 MED ORDER — FENTANYL CITRATE (PF) 100 MCG/2ML IJ SOLN
INTRAMUSCULAR | Status: AC
  Filled 2016-03-30: qty 2

## 2016-03-30 MED ORDER — LIDOCAINE HCL (PF) 1 % IJ SOLN
INTRAMUSCULAR | Status: DC | PRN
  Administered 2016-03-30: 18 mL via SUBCUTANEOUS
  Administered 2016-03-30: 2 mL via SUBCUTANEOUS
  Administered 2016-03-30: 10 mL via SUBCUTANEOUS

## 2016-03-30 MED ORDER — SODIUM CHLORIDE 0.9 % IV SOLN: 250.0000 mL | INTRAVENOUS | Status: DC | PRN

## 2016-03-30 MED ORDER — VERAPAMIL HCL 2.5 MG/ML IV SOLN
INTRAVENOUS | Status: AC
  Filled 2016-03-30: qty 2

## 2016-03-30 MED ORDER — HEPARIN SODIUM (PORCINE) 1000 UNIT/ML IJ SOLN
INTRAMUSCULAR | Status: AC
  Filled 2016-03-30: qty 1

## 2016-03-30 MED ORDER — DIAZEPAM 5 MG PO TABS: 5.0000 mg | ORAL_TABLET | ORAL | Status: DC | PRN

## 2016-03-30 MED ORDER — TICAGRELOR 90 MG PO TABS: 90.0000 mg | ORAL_TABLET | Freq: Two times a day (BID) | ORAL | Status: DC

## 2016-03-30 MED ORDER — BIVALIRUDIN 250 MG IV SOLR
INTRAVENOUS | Status: AC
  Filled 2016-03-30: qty 250

## 2016-03-30 MED ORDER — SODIUM CHLORIDE 0.9% FLUSH: 3.0000 mL | INTRAVENOUS | Status: DC | PRN

## 2016-03-30 MED ORDER — TICAGRELOR 90 MG PO TABS: 90.0000 mg | ORAL_TABLET | Freq: Two times a day (BID) | ORAL | 0 refills | Status: DC

## 2016-03-30 MED ORDER — SODIUM CHLORIDE 0.9 % IV SOLN
INTRAVENOUS | Status: DC | PRN
  Administered 2016-03-30: 1.75 mg/kg/h via INTRAVENOUS

## 2016-03-30 MED ORDER — IOPAMIDOL (ISOVUE-370) INJECTION 76%
INTRAVENOUS | Status: DC | PRN
  Administered 2016-03-30: 120 mL via INTRA_ARTERIAL

## 2016-03-30 MED ORDER — SODIUM CHLORIDE 0.9 % WEIGHT BASED INFUSION: 1.0000 mL/kg/h | INTRAVENOUS | Status: DC

## 2016-03-30 MED ORDER — INSULIN ASPART 100 UNIT/ML ~~LOC~~ SOLN: 0.0000 [IU] | Freq: Three times a day (TID) | SUBCUTANEOUS | Status: DC

## 2016-03-30 MED ORDER — MIDAZOLAM HCL 2 MG/2ML IJ SOLN
INTRAMUSCULAR | Status: AC
  Filled 2016-03-30: qty 2

## 2016-03-30 MED ORDER — TICAGRELOR 90 MG PO TABS: 90.0000 mg | ORAL_TABLET | Freq: Two times a day (BID) | ORAL | 11 refills | Status: DC

## 2016-03-30 MED ORDER — ASPIRIN 81 MG PO CHEW
CHEWABLE_TABLET | ORAL | Status: AC
  Administered 2016-03-30: 81 mg via ORAL
  Filled 2016-03-30: qty 1

## 2016-03-30 MED ORDER — HEPARIN (PORCINE) IN NACL 2-0.9 UNIT/ML-% IJ SOLN
INTRAMUSCULAR | Status: DC | PRN
Start: 2016-03-30 — End: 2016-03-30
  Administered 2016-03-30: 1000 mL

## 2016-03-30 MED ORDER — NITROGLYCERIN 1 MG/10 ML FOR IR/CATH LAB
INTRA_ARTERIAL | Status: AC
  Filled 2016-03-30: qty 10

## 2016-03-30 MED ORDER — MIDAZOLAM HCL 2 MG/2ML IJ SOLN
INTRAMUSCULAR | Status: DC | PRN
  Administered 2016-03-30: 1 mg via INTRAVENOUS
  Administered 2016-03-30: 2 mg via INTRAVENOUS

## 2016-03-30 MED ORDER — OXYCODONE-ACETAMINOPHEN 5-325 MG PO TABS: 1.0000 | ORAL_TABLET | ORAL | Status: DC | PRN

## 2016-03-30 MED ORDER — LIDOCAINE HCL (PF) 1 % IJ SOLN
INTRAMUSCULAR | Status: AC
  Filled 2016-03-30: qty 30

## 2016-03-30 MED ORDER — TICAGRELOR 90 MG PO TABS
ORAL_TABLET | ORAL | Status: AC
  Filled 2016-03-30: qty 2

## 2016-03-30 MED ORDER — ACETAMINOPHEN 325 MG PO TABS: 650.0000 mg | ORAL_TABLET | ORAL | Status: DC | PRN

## 2016-03-30 MED ORDER — BIVALIRUDIN BOLUS VIA INFUSION - CUPID
INTRAVENOUS | Status: DC | PRN
  Administered 2016-03-30: 78.6 mg via INTRAVENOUS

## 2016-03-30 MED ORDER — IOPAMIDOL (ISOVUE-370) INJECTION 76%
INTRAVENOUS | Status: AC
  Filled 2016-03-30: qty 50

## 2016-03-30 MED ORDER — HEPARIN (PORCINE) IN NACL 2-0.9 UNIT/ML-% IJ SOLN
INTRAMUSCULAR | Status: AC
  Filled 2016-03-30: qty 1000

## 2016-03-30 MED ORDER — SODIUM CHLORIDE 0.9 % WEIGHT BASED INFUSION
3.0000 mL/kg/h | INTRAVENOUS | Status: AC
  Administered 2016-03-30: 3 mL/kg/h via INTRAVENOUS

## 2016-03-30 MED ORDER — LABETALOL HCL 5 MG/ML IV SOLN: 10.0000 mg | INTRAVENOUS | Status: DC | PRN

## 2016-03-30 MED ORDER — NITROGLYCERIN 1 MG/10 ML FOR IR/CATH LAB
INTRA_ARTERIAL | Status: DC | PRN
  Administered 2016-03-30: 200 ug via INTRACORONARY

## 2016-03-30 MED ORDER — SODIUM CHLORIDE 0.9% FLUSH: 3.0000 mL | Freq: Two times a day (BID) | INTRAVENOUS | Status: DC

## 2016-03-30 MED ORDER — FENTANYL CITRATE (PF) 100 MCG/2ML IJ SOLN
INTRAMUSCULAR | Status: DC | PRN
  Administered 2016-03-30 (×2): 25 ug via INTRAVENOUS

## 2016-03-30 MED ORDER — ASPIRIN 81 MG PO CHEW
81.0000 mg | CHEWABLE_TABLET | ORAL | Status: AC
Start: 2016-03-30 — End: 2016-03-30
  Administered 2016-03-30: 81 mg via ORAL

## 2016-03-30 MED ORDER — IOPAMIDOL (ISOVUE-370) INJECTION 76%
INTRAVENOUS | Status: AC
  Filled 2016-03-30: qty 100

## 2016-03-30 MED ORDER — ONDANSETRON HCL 4 MG/2ML IJ SOLN: 4.0000 mg | Freq: Four times a day (QID) | INTRAMUSCULAR | Status: DC | PRN

## 2016-03-30 MED ORDER — VERAPAMIL HCL 2.5 MG/ML IV SOLN
INTRAVENOUS | Status: DC | PRN
  Administered 2016-03-30: 10 mL via INTRA_ARTERIAL

## 2016-03-30 MED ORDER — HYDRALAZINE HCL 20 MG/ML IJ SOLN: 5.0000 mg | INTRAMUSCULAR | Status: DC | PRN

## 2016-03-30 MED FILL — BRILINTA 90 MG TABLET: 90 | 30 days supply | Qty: 60 | Fill #0

## 2016-03-30 SURGICAL SUPPLY — 26 items
BALLN MOZEC 2.50X20 (BALLOONS) ×2
BALLN ~~LOC~~ EUPHORA RX 3.5X15 (BALLOONS) ×2
BALLOON MOZEC 2.50X20 (BALLOONS) ×1 IMPLANT
BALLOON ~~LOC~~ EUPHORA RX 3.5X15 (BALLOONS) ×1 IMPLANT
CATH INFINITI 5FR ANG PIGTAIL (CATHETERS) ×2 IMPLANT
CATH INFINITI 5FR JL4 (CATHETERS) ×2 IMPLANT
CATH INFINITI JR4 5F (CATHETERS) ×2 IMPLANT
CATH VISTA GUIDE 6FR XBLAD3.5 (CATHETERS) ×2 IMPLANT
DEVICE CLOSURE PERCLS PRGLD 6F (VASCULAR PRODUCTS) ×1 IMPLANT
DEVICE RAD COMP TR BAND LRG (VASCULAR PRODUCTS) ×2 IMPLANT
GLIDESHEATH SLEND SS 6F .021 (SHEATH) ×2 IMPLANT
GUIDEWIRE ANGLED .035X150CM (WIRE) ×2 IMPLANT
GUIDEWIRE INQWIRE 1.5J.035X260 (WIRE) ×1 IMPLANT
INQWIRE 1.5J .035X260CM (WIRE) ×2
KIT ENCORE 26 ADVANTAGE (KITS) ×2 IMPLANT
KIT HEART LEFT (KITS) ×2 IMPLANT
PACK CARDIAC CATHETERIZATION (CUSTOM PROCEDURE TRAY) ×2 IMPLANT
PERCLOSE PROGLIDE 6F (VASCULAR PRODUCTS) ×2
SHEATH PINNACLE 5F 10CM (SHEATH) ×2 IMPLANT
SHEATH PINNACLE 6F 10CM (SHEATH) ×2 IMPLANT
STENT RESOLUTE ONYX3.0X38 (Permanent Stent) ×2 IMPLANT
SYR MEDRAD MARK V 150ML (SYRINGE) ×2 IMPLANT
TRANSDUCER W/STOPCOCK (MISCELLANEOUS) ×2 IMPLANT
TUBING CIL FLEX 10 FLL-RA (TUBING) ×2 IMPLANT
WIRE COUGAR XT STRL 190CM (WIRE) ×2 IMPLANT
WIRE EMERALD 3MM-J .035X150CM (WIRE) ×2 IMPLANT

## 2016-03-30 NOTE — Discharge Instructions (Signed)
Stop plavix start brillinta   Angiogram, Care After Refer to this sheet in the next few weeks. These instructions provide you with information about caring for yourself after your procedure. Your health care provider may also give you more specific instructions. Your treatment has been planned according to current medical practices, but problems sometimes occur. Call your health care provider if you have any problems or questions after your procedure. What can I expect after the procedure? After your procedure, it is typical to have the following:  Bruising at the catheter insertion site that usually fades within 1-2 weeks.  Blood collecting in the tissue (hematoma) that may be painful to the touch. It should usually decrease in size and tenderness within 1-2 weeks. Follow these instructions at home:  Take medicines only as directed by your health care provider.  You may shower 24-48 hours after the procedure or as directed by your health care provider. Remove the bandage (dressing) and gently wash the site with plain soap and water. Pat the area dry with a clean towel. Do not rub the site, because this may cause bleeding.  Do not take baths, swim, or use a hot tub until your health care provider approves.  Check your insertion site every day for redness, swelling, or drainage.  Do not apply powder or lotion to the site.  Do not lift over 10 lb (4.5 kg) for 5 days after your procedure or as directed by your health care provider.  Ask your health care provider when it is okay to:  Return to work or school.  Resume usual physical activities or sports.  Resume sexual activity.  Do not drive home if you are discharged the same day as the procedure. Have someone else drive you.  You may drive 24 hours after the procedure unless otherwise instructed by your health care provider.  Do not operate machinery or power tools for 24 hours after the procedure or as directed by your health care  provider.  If your procedure was done as an outpatient procedure, which means that you went home the same day as your procedure, a responsible adult should be with you for the first 24 hours after you arrive home.  Keep all follow-up visits as directed by your health care provider. This is important. Contact a health care provider if:  You have a fever.  You have chills.  You have increased bleeding from the catheter insertion site. Hold pressure on the site. CALL 911 Get help right away if:  You have unusual pain at the catheter insertion site.  You have redness, warmth, or swelling at the catheter insertion site.  You have drainage (other than a small amount of blood on the dressing) from the catheter insertion site.  The catheter insertion site is bleeding, and the bleeding does not stop after 30 minutes of holding steady pressure on the site.  The area near or just beyond the catheter insertion site becomes pale, cool, tingly, or numb. This information is not intended to replace advice given to you by your health care provider. Make sure you discuss any questions you have with your health care provider. Document Released: 10/07/2004 Document Revised: 08/27/2015 Document Reviewed: 08/22/2012 Elsevier Interactive Patient Education  2017 [employer] Radial Site Care Introduction Refer to this sheet in the next few weeks. These instructions provide you with information about caring for yourself after your procedure. Your health care provider may also give you more specific instructions. Your treatment has been planned  according to current medical practices, but problems sometimes occur. Call your health care provider if you have any problems or questions after your procedure. What can I expect after the procedure? After your procedure, it is typical to have the following:  Bruising at the radial site that usually fades within 1-2 weeks.  Blood collecting in the tissue (hematoma)  that may be painful to the touch. It should usually decrease in size and tenderness within 1-2 weeks. Follow these instructions at home:  Take medicines only as directed by your health care provider.  You may shower 24-48 hours after the procedure or as directed by your health care provider. Remove the bandage (dressing) and gently wash the site with plain soap and water. Pat the area dry with a clean towel. Do not rub the site, because this may cause bleeding.  Do not take baths, swim, or use a hot tub until your health care provider approves.  Check your insertion site every day for redness, swelling, or drainage.  Do not apply powder or lotion to the site.  Do not flex or bend the affected arm for 24 hours or as directed by your health care provider.  Do not push or pull heavy objects with the affected arm for 24 hours or as directed by your health care provider.  Do not lift over 10 lb (4.5 kg) for 5 days after your procedure or as directed by your health care provider.  Ask your health care provider when it is okay to:  Return to work or school.  Resume usual physical activities or sports.  Resume sexual activity.  Do not drive home if you are discharged the same day as the procedure. Have someone else drive you.  You may drive 24 hours after the procedure unless otherwise instructed by your health care provider.  Do not operate machinery or power tools for 24 hours after the procedure.  If your procedure was done as an outpatient procedure, which means that you went home the same day as your procedure, a responsible adult should be with you for the first 24 hours after you arrive home.  Keep all follow-up visits as directed by your health care provider. This is important. Contact a health care provider if:  You have a fever.  You have chills.  You have increased bleeding from the radial site. Hold pressure on the site. CALL 911 Get help right away if:  You have  unusual pain at the radial site.  You have redness, warmth, or swelling at the radial site.  You have drainage (other than a small amount of blood on the dressing) from the radial site.  The radial site is bleeding, and the bleeding does not stop after 30 minutes of holding steady pressure on the site.  Your arm or hand becomes pale, cool, tingly, or numb. This information is not intended to replace advice given to you by your health care provider. Make sure you discuss any questions you have with your health care provider. Document Released: 04/23/2010 Document Revised: 08/27/2015 Document Reviewed: 10/07/2013  2017 Elsevier

## 2016-03-30 NOTE — Discharge Summary (Signed)
Discharge Summary    Patient ID: Derrick Todd,  MRN: [id number], DOB/AGE: 1961-02-01 55 y.o.  Admit date: 03/30/2016 Discharge date: 03/30/2016  Primary Care Provider: [facility] Primary Cardiologist: Dr Shirlee Latch  Discharge Diagnoses    Active Problems:   Abnormal nuclear cardiac imaging test CAD Hyperlipidemia Type II Diabetes  Allergies Allergies  Allergen Reactions  . Livalo [Pitavastatin] Other (See Comments)    Myalgias - leg cramps    Diagnostic Studies/Procedures    Procedures  03/30/2016 Coronary Stent Intervention  Left Heart Cath and Coronary Angiography  Conclusion   1. Severe LAD stenosis, treated successfully with PCI using a long drug-eluting stent platform 2. Continued patency of the stented segments in the right coronary artery 3. Severe stenosis of the distal left circumflex very small area of myocardium, appropriate for medical therapy 4. Normal LV systolic function  Recommendations: Continue dual antiplatelet therapy with aspirin and brilinta for 12 months as tolerated.. Aggressive medical therapy.   Note: The patient's LAD wraps the apex of the heart and likely accounts for both the anterior and infero-apical ischemia seen on his nuclear scan  Note: Avoid right radial approach if future cath is required    _____________   History of Present Illness     55 yo male with past medical history of CAD s/p NSTEMI in 08/2011 with BMS to totally occluded RCA, inferior STEMI in 03/2013 with BMS to 95% mid RCA lesion at [facility], HLD, and DM. He presents for  Echo (12/14) showed EF 50-55%. He has developed exertional chest pain relieved by SL Nitroglycerin.  Derrick Todd was done on 03/23/2016 showing 2 areas of ischemia in the RCA and mid LAD territories and suspicion for multivessel disease. LVEF was mildly decreased to 44%. He was scheduled for an outpatient cardiac cath.  Hospital Course     Consultants:  None  Patient presented for outpatient cardiac cath with findings of severe LAD stenosis, treated successfully with PCI using long DES, continued patency of RCA stents, severe stenosis of distal left circumflex feeding very small area of myocardium that is appropriate for medical therapy and normal LV systolic function.   The patient tolerated the procedure well. No immediate complications. Radial and r groin cath site stable.   Patient has been seen by Dr. Excell Seltzer today and deemed ready for discharge home. All follow up appointments have been scheduled. Discharge medications are listed below.  Plavix is being changed to Brilinta. Arrangements made for Brilinta to be delivered to patient prior to discharge through the [facility] program via [facility] pharmacy. _____________  Discharge Vitals Blood pressure 112/68, pulse 73, temperature 97.7 F (36.5 C), temperature source Oral, resp. rate 18, height 6\' 2"  (1.88 m), weight 231 lb (104.8 kg), SpO2 95 %.  Filed Weights   03/30/16 0924  Weight: 231 lb (104.8 kg)    Labs & Radiologic Studies    CBC  Recent Labs  03/30/16 0942  WBC 4.6  HGB 12.9*  HCT 38.7*  MCV 72.6*  PLT 235   Basic Metabolic Panel  Recent Labs  03/30/16 0942  NA 136  K 4.0  CL 104  CO2 24  GLUCOSE 98  BUN 17  CREATININE 1.01  CALCIUM 9.3   Liver Function Tests No results for input(s): AST, ALT, ALKPHOS, BILITOT, PROT, ALBUMIN in the last 72 hours. No results for input(s): LIPASE, AMYLASE in the last 72 hours. Cardiac Enzymes No results for input(s): CKTOTAL, CKMB, CKMBINDEX,  TROPONINI in the last 72 hours. BNP Invalid input(s): POCBNP D-Dimer No results for input(s): DDIMER in the last 72 hours. Hemoglobin A1C No results for input(s): HGBA1C in the last 72 hours. Fasting Lipid Panel No results for input(s): CHOL, HDL, LDLCALC, TRIG, CHOLHDL, LDLDIRECT in the last 72 hours. Thyroid Function Tests No results for input(s): TSH,  T4TOTAL, T3FREE, THYROIDAB in the last 72 hours.  Invalid input(s): FREET3 _____________  No results found. Disposition   Pt is being discharged home today in good condition.  Follow-up Plans & Appointments    Follow-up Information    Marca Ancona, MD. Schedule an appointment as soon as possible for a visit in 2 week(s).   Specialty:  Cardiology Why:  for post cath Contact information: [address] [place] [address] [phone]          Discharge Instructions    Amb Referral to Cardiac Rehabilitation    Complete by:  As directed    Diagnosis:  Coronary Stents   Diet - low sodium heart healthy    Complete by:  As directed    Discharge instructions    Complete by:  As directed    No driving for 48 hours. No lifting over 5 lbs for 1 week. No sexual activity for 1 week. You may return to work on 04/07/15. Keep procedure site clean & dry. If you notice increased pain, swelling, bleeding or pus, call/return!  You may shower, but no soaking baths/hot tubs/pools for 1 week.   Hold metformin to 2 days. Resume 04/01/16   Increase activity slowly    Complete by:  As directed       Discharge Medications   Current Discharge Medication List    START taking these medications   Details  ticagrelor (BRILINTA) 90 MG TABS tablet Take 1 tablet (90 mg total) by mouth 2 (two) times daily. Qty: 60 tablet, Refills: 11      CONTINUE these medications which have NOT CHANGED   Details  ASPIR-LOW 81 MG EC tablet TAKE 1 TABLET (81 MG TOTAL) BY MOUTH DAILY. Qty: 30 tablet, Refills: 1    Coenzyme Q10 200 MG capsule Take 1 capsule (200 mg total) by mouth daily.   Associated Diagnoses: Coronary atherosclerosis of native coronary artery; Other and unspecified hyperlipidemia    escitalopram (LEXAPRO) 10 MG tablet Take 1 tablet (10 mg total) by mouth daily. Qty: 30 tablet, Refills: 5   Associated Diagnoses: Situational stress    glipiZIDE (GLUCOTROL) 10 MG tablet TAKE  1 TABLET (10 MG TOTAL) BY MOUTH 2 (TWO) TIMES DAILY BEFORE A MEAL. Qty: 60 tablet, Refills: PRN    GLYXAMBI 25-5 MG TABS TAKE 1 TABLET BY MOUTH DAILY. Qty: 30 tablet, Refills: PRN    hydrOXYzine (VISTARIL) 25 MG capsule Take 2 capsules (50 mg total) by mouth 3 (three) times daily as needed (insomnia). Qty: 60 capsule, Refills: 3   Associated Diagnoses: Situational stress    Krill Oil 500 MG CAPS Take 500 mg by mouth daily.    lisinopril (PRINIVIL,ZESTRIL) 5 MG tablet TAKE 1 TABLET (5 MG TOTAL) BY MOUTH DAILY. Qty: 90 tablet, Refills: 3    magnesium oxide (MAG-OX) 400 MG tablet Take 400 mg by mouth daily.    metFORMIN (GLUCOPHAGE) 1000 MG tablet Take 1 tablet (1,000 mg total) by mouth 2 (two) times daily with a meal. Qty: 180 tablet, Refills: 3    metoprolol (LOPRESSOR) 50 MG tablet TAKE 1 TABLET (50 MG TOTAL) BY MOUTH  2 (TWO) TIMES DAILY. Qty: 180 tablet, Refills: 3    nitroGLYCERIN (NITROSTAT) 0.4 MG SL tablet Place 1 tablet (0.4 mg total) under the tongue every 5 (five) minutes x 3 doses as needed for chest pain. Qty: 25 tablet, Refills: 4    Omega-3 Fatty Acids (FISH OIL) 1000 MG CAPS Take 1,000 mg by mouth daily.     pantoprazole (PROTONIX) 40 MG tablet TAKE 1 TABLET BY MOUTH DAILY. Qty: 90 tablet, Refills: 3    potassium chloride (K-DUR,KLOR-CON) 10 MEQ tablet One tablet daily as needed Qty: 90 tablet, Refills: 3   Associated Diagnoses: Shortness of breath; Atherosclerosis of native coronary artery of native heart without angina pectoris    rosuvastatin (CRESTOR) 40 MG tablet TAKE 1 TABLET (20 MG TOTAL) BY MOUTH DAILY. Qty: 30 tablet, Refills: 3    Testosterone 20.25 MG/1.25GM (1.62%) GEL 2 pumps daily as per instructions Qty: 1.25 g, Refills: 5    Vitamin D, Cholecalciferol, 400 units TABS Take 400 Units by mouth daily.     furosemide (LASIX) 20 MG tablet Take 1 tablet (20 mg total) by mouth as needed for edema (feet and leg swelling). Qty: 90 tablet, Refills: 3     sildenafil (VIAGRA) 50 MG tablet Take 1 tablet (50 mg total) by mouth daily as needed for erectile dysfunction. DO NOT TAKE NITROGLYCERIN WITHIN 24 HOURS OF TAKING VIAGRA Qty: 15 tablet, Refills: 1   Associated Diagnoses: Hyperlipemia; Atherosclerosis of native coronary artery of native heart without angina pectoris; Tobacco abuse; Coronary artery disease involving native coronary artery of native heart without angina pectoris; Hyperlipidemia           Outstanding Labs/Studies   None  Duration of Discharge Encounter   Greater than 30 minutes including physician time.  Lorelei PontSigned, Lyvia Mondesir [facility] 03/30/2016, 6:46 PM

## 2016-03-30 NOTE — Interval H&P Note (Signed)
Cath Lab Visit (complete for each Cath Lab visit)  Clinical Evaluation Leading to the Procedure:   ACS: No.  Non-ACS:    Anginal Classification: CCS II  Anti-ischemic medical therapy: Minimal Therapy (1 class of medications)  Non-Invasive Test Results: High-risk stress test findings: cardiac mortality >3%/year  Prior CABG: No previous CABG      History and Physical Interval Note:  03/30/2016 11:03 AM  Derrick Todd  has presented today for surgery, with the diagnosis of abnormal stress test  The various methods of treatment have been discussed with the patient and family. After consideration of risks, benefits and other options for treatment, the patient has consented to  Procedure(s): Left Heart Cath and Coronary Angiography ([name]) as a surgical intervention .  The patient's history has been reviewed, patient examined, no change in status, stable for surgery.  I have reviewed the patient's chart and labs.  Questions were answered to the patient's satisfaction.     Derrick Todd

## 2016-03-30 NOTE — H&P (View-Only) (Signed)
Patient ID: Derrick Todd, male   DOB: [birth year], 55 y.o.   MRN: [id number] PCP: Dr. Tanya NonesPickard  55 yo with history of CAD s/p NSTEMI in 5/13 and inferior STEMI in 12/14 presents for followup.  In 5/13, he had an NSTEMI with BMS to totally occluded distal RCA.  In 12/14, he had an inferior STEMI with 95% mid RCA (distal RCA stent patent[facility].  [facility].  Echo (12/14) showed EF 50-55%.  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  He quit smoking in 7/14.  Echo (6/15) showed EF 55-60% with basal to mid inferolateral hypokinesis (no significant change from prior).   He had myalgias with atorvastatin and was actually off his statin when he had the 12/14 MI.  He has since started Crestor but then stopped due to myalgias.  LDL was very high in 9/17 so he restarted Crestor and is now taking 20 mg daily.  He is currently not having myalgias.  No exertional dyspnea. Normally, he has not been having chest pain.  However, about 2 weeks ago, he had substernal chest tightness while raking leaves.  This resolved with NTG.  He is now working in the [employer].    ECG: NSR, old inferior MI.   Labs (5/13): LDL 122, HDL 32 Labs (6/13): K 3.9, creatinine 1.0 Labs (12/14): K 3.8, creatinine 1.0, LDL 153 Labs (3/15): LDL 88, HDL 43, hgbA1c 9.2 Labs (6/15): K 4, creatinine 1.0 Labs (9/17): LDL 212, hgb 14.2  PMH: 1. CAD: NSTEMI 5/13 with LHC showing 70% OM, 70% mLAD, total occlusion distal RCA, EF 55%.  Had 2 x 18 BMS to distal RCA.  Echo (5/13): EF 55-60% with basal inferior hypokinesis.  Inferior STEMI (12/14).  LHC showed 60% mLAD, 60-70% dLCx, 95% mRCA, patent dRCA stent.  [facility]  Echo (12/14) with EF 50-55% [facility]  Echo (6/15) with EF 55-60%, basal to mid inferolateral hypokinesis.  2. Hyperlipidemia: myalgias with atorvastatin. Myalgias with Crestor but more manageable.  3. Type II diabetes 4. Erectile dysfunction 5. 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  2 daughters.  Quit smoking in 7/14.   FH: CAD  ROS: All systems reviewed and negative except as per HPI.   Current Outpatient Prescriptions  Medication Sig Dispense Refill  . ASPIR-LOW 81 MG EC tablet TAKE 1 TABLET (81 MG TOTAL) BY MOUTH DAILY. 30 tablet 1  . clopidogrel (PLAVIX) 75 MG tablet Take 1 tablet (75 mg total) by mouth daily. 90 tablet 3  . Coenzyme Q10 200 MG capsule Take 1 capsule (200 mg total) by mouth daily.    . furosemide (LASIX) 20 MG tablet Take 1 tablet (20 mg total) by mouth as needed for edema (feet and leg swelling). 90 tablet 3  . glipiZIDE (GLUCOTROL) 10 MG tablet TAKE 1 TABLET (10 MG TOTAL) BY MOUTH 2 (TWO) TIMES DAILY BEFORE A MEAL. 60 tablet PRN  . GLYXAMBI 25-5 MG TABS TAKE 1 TABLET BY MOUTH DAILY. 30 tablet PRN  . hydrOXYzine (VISTARIL) 25 MG capsule Take 2 capsules (50 mg total) by mouth 3 (three) times daily as needed (insomnia). (Patient taking differently: Take 50 mg by mouth at bedtime as needed (insomnia). ) 60 capsule 3  . lisinopril (PRINIVIL,ZESTRIL) 5 MG tablet TAKE 1 TABLET (5 MG TOTAL) BY MOUTH DAILY. 90 tablet 3  .  magnesium oxide (MAG-OX) 400 MG tablet Take 400 mg by mouth daily.    . metFORMIN (GLUCOPHAGE) 1000 MG tablet Take 1 tablet (1,000 mg total) by mouth 2 (two) times daily with a meal. 180 tablet 3  . metoprolol (LOPRESSOR) 50 MG tablet TAKE 1 TABLET (50 MG TOTAL) BY MOUTH 2 (TWO) TIMES DAILY. 180 tablet 3  . nitroGLYCERIN (NITROSTAT) 0.4 MG SL tablet Place 1 tablet (0.4 mg total) under the tongue every 5 (five) minutes x 3 doses as needed for chest pain. 25 tablet 4  . Omega-3 Fatty Acids (FISH OIL) 1200 MG CAPS Take by mouth daily. TAKE 1000 AND KRILL OIL 800    . pantoprazole (PROTONIX) 40 MG tablet TAKE 1 TABLET BY MOUTH DAILY. 90 tablet 3  . potassium chloride (K-DUR,KLOR-CON) 10 MEQ tablet One tablet daily as needed 90 tablet 3  .  rosuvastatin (CRESTOR) 40 MG tablet TAKE 1 TABLET (20 MG TOTAL) BY MOUTH DAILY. 30 tablet 3  . sildenafil (VIAGRA) 50 MG tablet Take 1 tablet (50 mg total) by mouth daily as needed for erectile dysfunction. DO NOT TAKE NITROGLYCERIN WITHIN 24 HOURS OF TAKING VIAGRA 15 tablet 1  . Testosterone 20.25 MG/1.25GM (1.62%) GEL 2 pumps daily as per instructions 1.25 g 5  . Vitamin D, Cholecalciferol, 400 units TABS Take by mouth.    . escitalopram (LEXAPRO) 10 MG tablet Take 1 tablet (10 mg total) by mouth daily. (Patient not taking: Reported on 03/17/2016) 30 tablet 5   No current facility-administered medications for this visit.     BP 122/64   Pulse 90   Ht 6\' 2"  (1.88 m)   Wt 235 lb (106.6 kg)   BMI 30.17 kg/m  General: NAD Neck: JVP 7 cm, no thyromegaly or thyroid nodule.  Lungs: Clear to auscultation bilaterally with normal respiratory effort. CV: Nondisplaced PMI.  Heart regular S1/S2, +S4, no murmur.  No edema.  No carotid bruit.  Normal pedal pulses.  Abdomen: Soft, nontender, no hepatosplenomegaly, no distention.  Neurologic: Alert and oriented x 3.  Psych: Normal affect. Extremities: No clubbing or cyanosis.   Assessment/Plan: 1. CAD: He had an episode of suspected anginal chest pain about 2 wks ago, resolving with NTG.    - Continue ASA 81, Plavix, statin, lisinopril, metoprolol.  I think that he should continue Plavix long-term as long as he has no bleeding side effects (DAPT score = 4). - Given chest pain, will risk stratify with Lexiscan Cardiolite.  2. Hyperlipidemia:  LDL was very high in 9/17 off Crestor.  He is now back on Crestor 20 mg daily without myalgias.  Need lipids/LFTs checked today.  If LDL remains elevated, will increase Crestor with next step then add Zetia if needed.  Need to be aggressive with lipids.  3. Erectile dysfunction: He can take Viagra but was cautioned about the risk of hypotension if NTG is taken while Viagra is in the system.   Followup in 1 year  if Cardiolite is low risk.  [facility]    Marca Ancona 03/17/2016

## 2016-03-30 NOTE — Progress Notes (Signed)
[id number] Pt worked last night and is very sleepy. Most of ed done with wife who is Charity fundraiser. Did discuss with pt importance of brilinta with stent and discussed CRP 2. Will refer to [place] program. Discussed with wife briefly diabetic and heart healthy diets, NTG use, ex ed. Encouraged smoking cessation as she stated pt vapes some. Luetta Nutting RN BSN 03/30/2016 3:04 PM

## 2016-03-31 ENCOUNTER — Telehealth: Payer: Self-pay | Admitting: Physician Assistant

## 2016-03-31 ENCOUNTER — Encounter (HOSPITAL_COMMUNITY): Payer: Self-pay | Admitting: Cardiovascular Disease

## 2016-03-31 MED FILL — Ticagrelor Tab 90 MG: ORAL | Qty: 2 | Status: AC

## 2016-03-31 NOTE — Telephone Encounter (Signed)
Richardean Canal called patient to inform him of f/u in 7 days. He acknowledged this information. Mazikeen Hehn PA-C

## 2016-03-31 NOTE — Progress Notes (Signed)
Notified Derrick Todd of follow up appt on January 4th at [facility].  He states he has had some shortness of breath with the Brilintia,  He states that caffeine did help.  Told him if it doesn't get better instructed to call the office.

## 2016-04-05 ENCOUNTER — Encounter: Payer: Self-pay | Admitting: Family Medicine

## 2016-04-05 NOTE — Progress Notes (Signed)
Cardiology Office Note    Date:  04/07/2016   ID:  Derrick Todd, DOB [birth year], MRN [id number]  PCP:  Leo Grosser, MD  Cardiologist:  Dr. Shirlee Latch  Chief Complaint: Hospital follow up s/p  Cath  History of Present Illness:   Derrick Todd is a 56 y.o. male with past medical history of CAD s/p NSTEMI in 08/2011 with BMS to totally occluded RCA, inferior STEMI in 03/2013 with BMS to 95% mid RCA lesion at [facility], HLD, and DM who recently had cath for abnormal stress test presents for follow up.   [name] was done on 03/23/2016 showing 2 areas of ischemia in the RCA and mid LAD territories and suspicion for multivessel disease. LVEF was mildly decreased to 44%. Outpatient cardiac cath with findings of severe LAD stenosis, treated successfully with PCI using long DES, continued patency of RCA stents, severe stenosis of distal left circumflex feeding very small area of myocardium that is appropriate for medical therapy and normal LV systolic function. Plavix is being changed to Brilinta.  Here today for follow up. He has been feeling well. Intermittent dyspnea after taking brillinta that has been getting better. Tolerable. Patient denies any exertional chest pain. Denies orthopnea, PND, syncope, lower extremity edema, melena or blood in his stool or urine. Compliant with medication.   Past Medical History:  Diagnosis Date  . Abnormal nuclear cardiac imaging test 03/30/2016  . CAD (coronary artery disease)    a. inferior STEMI 5/13:  LHC prox-mid LAD 20-30%, mid LAD 70%, pOM 70%, pRCA 20%, mid 40%, AM Br 50-60%, dRCA 70% then 100%.  EF 55%.  PCI:  BMS to the distal RCA.;  b. inf STEMI (03/2013 at [facility]): LHC -  mLAD 60%, dCFX 70-80, mRCA 95, dRCA stent ok.  PCI:  Vision (2.5 x 15 mm) BMS to the mRCA. C. 03/30/16 DES to LAD, prior stents patent, nl LVF  . DM2 (diabetes mellitus, type 2) (HCC)   . Dyslipidemia   . GERD (gastroesophageal reflux disease)   .  Hx of echocardiogram    a. 2-D echocardiogram 09/02/11: Mild LVH, EF 55-60%, basal inferior HK, mild LAE, PASP 32.;   b. Echo (03/20/2013):  Mild TR, EF 50-55%, mild LVH.  Marland Kitchen Hyperlipidemia   . Myocardial infarction    2014 AND 2015  . Tobacco abuse     Past Surgical History:  Procedure Laterality Date  . CARDIAC CATHETERIZATION [name] 03/30/2016   Procedure: Left Heart Cath and Coronary Angiography;  Surgeon: [name], MD;  Location: [facility] INVASIVE CV LAB;  Service: Cardiovascular;  Laterality: [name];  . CARDIAC CATHETERIZATION [name] 03/30/2016   Procedure: Coronary Stent Intervention;  Surgeon: [name], MD;  Location: [facility] INVASIVE CV LAB;  Service: Cardiovascular;  Laterality: [name];  . LEFT HEART CATHETERIZATION WITH CORONARY ANGIOGRAM [name] 09/02/2011   Procedure: LEFT HEART CATHETERIZATION WITH CORONARY ANGIOGRAM;  Surgeon: Herby Abraham, MD;  Location: [facility] CATH LAB;  Service: Cardiovascular;  Laterality: [name];  . PERCUTANEOUS CORONARY STENT INTERVENTION (PCI-S) Right 09/02/2011   Procedure: PERCUTANEOUS CORONARY STENT INTERVENTION (PCI-S);  Surgeon: Herby Abraham, MD;  Location: [facility] CATH LAB;  Service: Cardiovascular;  Laterality: Right;  . TUMOR REMOVAL  2012    Current Medications: Prior to Admission medications   Medication Sig Start Date End Date Taking? Authorizing Provider  ASPIR-LOW 81 MG EC tablet TAKE 1 TABLET (81 MG TOTAL) BY MOUTH DAILY. 04/11/13   Laurey Morale, MD  Coenzyme Q10 200 MG  capsule Take 1 capsule (200 mg total) by mouth daily. 04/09/13   Laurey Morale, MD  escitalopram (LEXAPRO) 10 MG tablet Take 1 tablet (10 mg total) by mouth daily. Patient taking differently: Take 10 mg by mouth daily as needed (for anxiety).  12/21/15   Donita Wank, MD  furosemide (LASIX) 20 MG tablet Take 1 tablet (20 mg total) by mouth as needed for edema (feet and leg swelling). 09/28/15   Laurey Morale, MD  glipiZIDE (GLUCOTROL) 10 MG tablet TAKE 1 TABLET (10 MG TOTAL) BY MOUTH 2  (TWO) TIMES DAILY BEFORE A MEAL. 08/10/15   Donita Nunes, MD  GLYXAMBI 25-5 MG TABS TAKE 1 TABLET BY MOUTH DAILY. 01/19/16   Donita Heishman, MD  hydrOXYzine (VISTARIL) 25 MG capsule Take 2 capsules (50 mg total) by mouth 3 (three) times daily as needed (insomnia). Patient taking differently: Take 50 mg by mouth at bedtime as needed (insomnia).  12/21/15   Donita Eisler, MD  Krill Oil 500 MG CAPS Take 500 mg by mouth daily.    Historical Provider, MD  lisinopril (PRINIVIL,ZESTRIL) 5 MG tablet TAKE 1 TABLET (5 MG TOTAL) BY MOUTH DAILY. 09/28/15   Laurey Morale, MD  magnesium oxide (MAG-OX) 400 MG tablet Take 400 mg by mouth daily.    Historical Provider, MD  metFORMIN (GLUCOPHAGE) 1000 MG tablet Take 1 tablet (1,000 mg total) by mouth 2 (two) times daily with a meal. 12/28/15   Donita Pund, MD  metoprolol (LOPRESSOR) 50 MG tablet TAKE 1 TABLET (50 MG TOTAL) BY MOUTH 2 (TWO) TIMES DAILY. 09/28/15   Laurey Morale, MD  nitroGLYCERIN (NITROSTAT) 0.4 MG SL tablet Place 1 tablet (0.4 mg total) under the tongue every 5 (five) minutes x 3 doses as needed for chest pain. 09/28/15   Laurey Morale, MD  Omega-3 Fatty Acids (FISH OIL) 1000 MG CAPS Take 1,000 mg by mouth daily.     Historical Provider, MD  pantoprazole (PROTONIX) 40 MG tablet TAKE 1 TABLET BY MOUTH DAILY. Patient taking differently: Take 40 mg by mouth daily.  09/28/15   Laurey Morale, MD  potassium chloride (K-DUR,KLOR-CON) 10 MEQ tablet One tablet daily as needed Patient taking differently: Take 10 mEq by mouth as needed (for potassium).  09/28/15   Laurey Morale, MD  rosuvastatin (CRESTOR) 40 MG tablet TAKE 1 TABLET (20 MG TOTAL) BY MOUTH DAILY. Patient taking differently: Take 40 mg by mouth daily.  09/28/15   Laurey Morale, MD  sildenafil (VIAGRA) 50 MG tablet Take 1 tablet (50 mg total) by mouth daily as needed for erectile dysfunction. DO NOT TAKE NITROGLYCERIN WITHIN 24 HOURS OF TAKING VIAGRA 05/15/14   Laurey Morale, MD    Testosterone 20.25 MG/1.25GM (1.62%) GEL 2 pumps daily as per instructions 02/17/16   Donita Kamau, MD  ticagrelor (BRILINTA) 90 MG TABS tablet Take 1 tablet (90 mg total) by mouth 2 (two) times daily. 03/30/16   Manson Passey, PA  Vitamin D, Cholecalciferol, 400 units TABS Take 400 Units by mouth daily.     Historical Provider, MD    Allergies:   Livalo [pitavastatin]   Social History   Social History  . Marital status: Married    Spouse name: [name]  . Number of children: [name]  . Years of education: [name]   Social History Main Topics  . Smoking status: Former Smoker    Types: Cigarettes    Quit date: 04/05/2011  .  Smokeless tobacco: Never Used     Comment: quit in July  . Alcohol use 3.6 oz/week    3 Cans of beer, 3 Shots of liquor per week  . Drug use: No  . Sexual activity: Yes    Birth control/ protection: None   Other Topics Concern  . None   Social History Narrative  . None     Family History:  The patient's family history includes Arthritis in his mother; Depression in his mother; Diabetes in his brother and mother; Heart attack in his maternal grandfather; Heart failure in his maternal grandfather; Hyperlipidemia in his mother; Hypertension in his brother, father, maternal grandfather, and mother; Learning disabilities in his mother; Mental illness in his mother; Stroke in his father and mother.   ROS:   Please see the history of present illness.    ROS All other systems reviewed and are negative.   PHYSICAL EXAM:   VS:  BP 104/70 (BP Location: Right Arm)   Pulse 92   Ht 6\' 2"  (1.88 m)   Wt 230 lb 12.8 oz (104.7 kg)   BMI 29.63 kg/m    GEN: Well nourished, well developed, in no acute distress  HEENT: normal  Neck: no JVD, carotid bruits, or masses Cardiac: RRR; no murmurs, rubs, or gallops,no edema. Right radial cath site is stable Respiratory:  clear to auscultation bilaterally, normal work of breathing GI: soft, nontender, nondistended, + BS MS: no  deformity or atrophy  Skin: warm and dry, no rash Neuro:  Alert and Oriented x 3, Strength and sensation are intact Psych: euthymic mood, full affect  Wt Readings from Last 3 Encounters:  04/07/16 230 lb 12.8 oz (104.7 kg)  03/30/16 231 lb (104.8 kg)  03/17/16 235 lb (106.6 kg)      Studies/Labs Reviewed:   EKG:  EKG is not ordered today.    Recent Labs: 03/17/2016: ALT 23 03/30/2016: BUN 17; Creatinine, Ser 1.01; Hemoglobin 12.9; Platelets 235; Potassium 4.0; Sodium 136   Lipid Panel    Component Value Date/Time   CHOL 139 03/17/2016 1628   TRIG 139 03/17/2016 1628   HDL 40 (L) 03/17/2016 1628   CHOLHDL 3.5 03/17/2016 1628   VLDL 28 03/17/2016 1628   LDLCALC 71 03/17/2016 1628   LDLDIRECT 155.0 05/15/2014 1511    Additional studies/ records that were reviewed today include:   Echocardiogram: 09/2013  Study Conclusions  - Left ventricle: The cavity size was normal. Wall thickness was increased in a pattern of moderate LVH. Systolic function was normal. The estimated ejection fraction was in the range of 55% to 60%. There is basal to mid inferolateral hypokinesis. Doppler parameters are consistent with abnormal left ventricular relaxation (grade 1 diastolic dysfunction). The E/e&' ratio is <8, suggesting normal LV filling pressure. - Left atrium: The atrium was normal in size.  Impressions:  - No significant changes compared to echo in 2013.   Coronary Stent Intervention 03/30/2016  Left Heart Cath and Coronary Angiography  Conclusion   1. Severe LAD stenosis, treated successfully with PCI using a long drug-eluting stent platform 2. Continued patency of the stented segments in the right coronary artery 3. Severe stenosis of the distal left circumflex very small area of myocardium, appropriate for medical therapy 4. Normal LV systolic function  Recommendations: Continue dual antiplatelet therapy with aspirin and brilinta for 12 months as  tolerated.. Aggressive medical therapy.   Note: The patient's LAD wraps the apex of the heart and likely accounts for both the  anterior and infero-apical ischemia seen on his nuclear scan  Note: Avoid right radial approach if future cath is required     ASSESSMENT & PLAN:    1. CAD s/p BMS to dist RCA in 2013, BMS to mid RCA in 2014  -  Now s/p 03/30/16 DES to LAD, prior stents patent. Intermittent dyspnea that has been getting better. EF normal on cath - Continue ASA, Brillinta, lisinopril, metoprolol and statin.   2. HLD - 03/17/2016: Cholesterol 139; HDL 40; LDL Cholesterol 71; Triglycerides 139; VLDL 28  - Continue statin.   F/u with Dr. Shirlee Latch [facility].     Medication Adjustments/Labs and Tests Ordered: Current medicines are reviewed at length with the patient today.  Concerns regarding medicines are outlined above.  Medication changes, Labs and Tests ordered today are listed in the Patient Instructions below. Patient Instructions  Medication Instructions:  Same-no changes  Labwork: None  Testing/Procedures: None  Follow-Up: In 3 months with Dr Shirlee Latch     If you need a refill on your cardiac medications before your next appointment, please call your pharmacy.      Lorelei Pont, [place]  04/07/2016 4:09 PM    [facility] [address] [place], [place], [place]  [address] Phone: [phone]; Fax: [phone]

## 2016-04-07 ENCOUNTER — Encounter: Payer: Self-pay | Admitting: Physician Assistant

## 2016-04-07 ENCOUNTER — Telehealth: Payer: Self-pay | Admitting: *Deleted

## 2016-04-07 ENCOUNTER — Ambulatory Visit (INDEPENDENT_AMBULATORY_CARE_PROVIDER_SITE_OTHER): Payer: 59 | Admitting: Physician Assistant

## 2016-04-07 VITALS — BP 104/70 | HR 92 | Ht 74.0 in | Wt 230.8 lb

## 2016-04-07 DIAGNOSIS — E785 Hyperlipidemia, unspecified: Secondary | ICD-10-CM | POA: Diagnosis not present

## 2016-04-07 DIAGNOSIS — Z955 Presence of coronary angioplasty implant and graft: Secondary | ICD-10-CM

## 2016-04-07 DIAGNOSIS — I2510 Atherosclerotic heart disease of native coronary artery without angina pectoris: Secondary | ICD-10-CM

## 2016-04-07 NOTE — Telephone Encounter (Signed)
lvm with CHF clinic to call pt to cancel January appointment and reschedule in 3 months at the CHF clinic with Dr. Jearld Pies.

## 2016-04-07 NOTE — Patient Instructions (Addendum)
**Note De-Identified Dariane Natzke Obfuscation** Medication Instructions:  Your physician recommends that you continue on your current medications as directed. Please refer to the Current Medication list given to you today.   Labwork: None  Testing/Procedures: None  Follow-Up: Your physician recommends that you schedule a follow-up appointment in: 3 months with Dr. Shirlee Latch in the CHF clinic.  Someone from that office should be calling you to schedule appointment.       If you need a refill on your cardiac medications before your next appointment, please call your pharmacy.

## 2016-04-13 MED FILL — glipiZIDE 10 MG TABS: 10 | 30 days supply | Qty: 60 | Fill #4

## 2016-04-26 ENCOUNTER — Encounter (HOSPITAL_COMMUNITY): Payer: 59

## 2016-05-03 ENCOUNTER — Emergency Department (HOSPITAL_COMMUNITY)
Admission: EM | Admit: 2016-05-03 | Discharge: 2016-05-03 | Disposition: A | Discharge status: Home / Self Care | Payer: 59 | Attending: Emergency Medicine | Admitting: Emergency Medicine

## 2016-05-03 ENCOUNTER — Emergency Department (HOSPITAL_COMMUNITY): Payer: 59

## 2016-05-03 ENCOUNTER — Encounter (HOSPITAL_COMMUNITY): Payer: Self-pay | Admitting: Family Medicine

## 2016-05-03 DIAGNOSIS — I2510 Atherosclerotic heart disease of native coronary artery without angina pectoris: Secondary | ICD-10-CM | POA: Diagnosis not present

## 2016-05-03 DIAGNOSIS — I252 Old myocardial infarction: Secondary | ICD-10-CM | POA: Diagnosis not present

## 2016-05-03 DIAGNOSIS — Z87891 Personal history of nicotine dependence: Secondary | ICD-10-CM | POA: Diagnosis not present

## 2016-05-03 DIAGNOSIS — Z7982 Long term (current) use of aspirin: Secondary | ICD-10-CM | POA: Diagnosis not present

## 2016-05-03 DIAGNOSIS — I5032 Chronic diastolic (congestive) heart failure: Secondary | ICD-10-CM | POA: Diagnosis not present

## 2016-05-03 DIAGNOSIS — R079 Chest pain, unspecified: Secondary | ICD-10-CM | POA: Insufficient documentation

## 2016-05-03 DIAGNOSIS — E119 Type 2 diabetes mellitus without complications: Secondary | ICD-10-CM | POA: Diagnosis not present

## 2016-05-03 DIAGNOSIS — Z7984 Long term (current) use of oral hypoglycemic drugs: Secondary | ICD-10-CM | POA: Insufficient documentation

## 2016-05-03 DIAGNOSIS — R05 Cough: Secondary | ICD-10-CM | POA: Diagnosis not present

## 2016-05-03 LAB — CBC WITH DIFFERENTIAL/PLATELET
BASOS ABS: 0.1 10*3/uL (ref 0.0–0.1)
BASOS PCT: 1 %
EOS ABS: 0.2 10*3/uL (ref 0.0–0.7)
EOS PCT: 4 %
HCT: 39.5 % (ref 39.0–52.0)
Hemoglobin: 12.9 g/dL — ABNORMAL LOW (ref 13.0–17.0)
LYMPHS PCT: 14 %
Lymphs Abs: 0.8 10*3/uL (ref 0.7–4.0)
MCH: 23.7 pg — ABNORMAL LOW (ref 26.0–34.0)
MCHC: 32.7 g/dL (ref 30.0–36.0)
MCV: 72.6 fL — ABNORMAL LOW (ref 78.0–100.0)
MONO ABS: 0.8 10*3/uL (ref 0.1–1.0)
Monocytes Relative: 14 %
Neutro Abs: 3.8 10*3/uL (ref 1.7–7.7)
Neutrophils Relative %: 67 %
PLATELETS: 239 10*3/uL (ref 150–400)
RBC: 5.44 MIL/uL (ref 4.22–5.81)
RDW: 15.6 % — AB (ref 11.5–15.5)
WBC: 5.6 10*3/uL (ref 4.0–10.5)

## 2016-05-03 LAB — I-STAT TROPONIN, ED: TROPONIN I, POC: 0 ng/mL (ref 0.00–0.08)

## 2016-05-03 LAB — BASIC METABOLIC PANEL
ANION GAP: 8 (ref 5–15)
BUN: 14 mg/dL (ref 6–20)
CALCIUM: 9.1 mg/dL (ref 8.9–10.3)
CO2: 24 mmol/L (ref 22–32)
Chloride: 103 mmol/L (ref 101–111)
Creatinine, Ser: 1.12 mg/dL (ref 0.61–1.24)
GLUCOSE: 107 mg/dL — AB (ref 65–99)
POTASSIUM: 3.9 mmol/L (ref 3.5–5.1)
SODIUM: 135 mmol/L (ref 135–145)

## 2016-05-03 NOTE — Discharge Instructions (Signed)
EKG and troponin were normal. Follow-up your cardiologist
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
## 2016-05-03 NOTE — ED Notes (Signed)
Bed: WA06 Expected date:  Expected time:  Means of arrival:  Comments: 

## 2016-05-03 NOTE — ED Notes (Signed)
Patient transported to X-ray 

## 2016-05-03 NOTE — ED Triage Notes (Signed)
Patient reports when he woke up this afternoon he was experiencing jaw pain and right arm pain. Also, has driving to work, he developed right chest discomfort. Discomfort radiates to his back.

## 2016-05-04 MED FILL — ANDROGEL 1.62% GEL PUMP: 20.25 MG/AC | 30 days supply | Qty: 75 | Fill #2

## 2016-05-04 MED FILL — KLOR-CON M10 TABLET: 10 | 90 days supply | Qty: 90 | Fill #2

## 2016-05-04 MED FILL — METOPROLOL TARTRATE 50 MG T: 50 | 90 days supply | Qty: 180 | Fill #2

## 2016-05-04 MED FILL — FUROSEMIDE 20 MG TABLET: 20 | 30 days supply | Qty: 30 | Fill #2

## 2016-05-04 MED FILL — ESCITALOPRAM 10 MG TABLET: 10 | 30 days supply | Qty: 30 | Fill #2

## 2016-05-04 MED FILL — PANTOPRAZOLE SOD DR 40 MG T: 40 | 90 days supply | Qty: 90 | Fill #2

## 2016-05-04 MED FILL — BRILINTA 90 MG TABLET: 90 | 30 days supply | Qty: 60 | Fill #0

## 2016-05-04 MED FILL — LISINOPRIL 5 MG TABLET: 5 | 90 days supply | Qty: 90 | Fill #2

## 2016-05-04 MED FILL — metFORMIN HCL 1000 MG TABS: 1000 | 90 days supply | Qty: 180 | Fill #1

## 2016-05-04 MED FILL — GLYXAMBI 25 MG-5 MG TABLET: 25-5 | 30 days supply | Qty: 30 | Fill #3

## 2016-05-23 MED FILL — glipiZIDE 10 MG TABS: 10 | 30 days supply | Qty: 60 | Fill #5

## 2016-05-27 ENCOUNTER — Encounter (HOSPITAL_COMMUNITY): Payer: Self-pay | Admitting: Family Medicine

## 2016-05-27 NOTE — Progress Notes (Signed)
Mailed patient letter with information about Cardiac Rehab program. MW °

## 2016-05-30 ENCOUNTER — Encounter: Payer: Self-pay | Admitting: Family Medicine

## 2016-06-14 ENCOUNTER — Encounter: Payer: Self-pay | Admitting: Family Medicine

## 2016-06-14 ENCOUNTER — Ambulatory Visit (INDEPENDENT_AMBULATORY_CARE_PROVIDER_SITE_OTHER): Payer: 59 | Admitting: Family Medicine

## 2016-06-14 VITALS — BP 110/58 | HR 78 | Temp 97.9°F | Resp 14 | Ht 74.0 in | Wt 230.0 lb

## 2016-06-14 DIAGNOSIS — E1100 Type 2 diabetes mellitus with hyperosmolarity without nonketotic hyperglycemic-hyperosmolar coma (NKHHC): Secondary | ICD-10-CM | POA: Diagnosis not present

## 2016-06-14 DIAGNOSIS — E7800 Pure hypercholesterolemia, unspecified: Secondary | ICD-10-CM

## 2016-06-14 DIAGNOSIS — R0989 Other specified symptoms and signs involving the circulatory and respiratory systems: Secondary | ICD-10-CM | POA: Diagnosis not present

## 2016-06-14 DIAGNOSIS — E291 Testicular hypofunction: Secondary | ICD-10-CM

## 2016-06-14 DIAGNOSIS — I5032 Chronic diastolic (congestive) heart failure: Secondary | ICD-10-CM

## 2016-06-14 DIAGNOSIS — I2510 Atherosclerotic heart disease of native coronary artery without angina pectoris: Secondary | ICD-10-CM

## 2016-06-14 LAB — LIPID PANEL
CHOL/HDL RATIO: 3 ratio (ref <5.0)
Cholesterol: 121 mg/dL (ref <200)
HDL: 41 mg/dL (ref >40)
LDL CALC: 58 mg/dL (ref <100)
Triglycerides: 109 mg/dL (ref <150)
VLDL: 22 mg/dL (ref <30)

## 2016-06-14 LAB — CBC WITH DIFFERENTIAL/PLATELET
BASOS ABS: 53 {cells}/uL (ref 0–200)
Basophils Relative: 1 %
EOS PCT: 6 %
Eosinophils Absolute: 318 cells/uL (ref 15–500)
HCT: 40.1 % (ref 38.5–50.0)
Hemoglobin: 12.9 g/dL — ABNORMAL LOW (ref 13.0–17.0)
Lymphocytes Relative: 23 %
Lymphs Abs: 1219 cells/uL (ref 850–3900)
MCH: 23.7 pg — AB (ref 27.0–33.0)
MCHC: 32.2 g/dL (ref 32.0–36.0)
MCV: 73.6 fL — ABNORMAL LOW (ref 80.0–100.0)
MPV: 8.4 fL (ref 7.5–12.5)
Monocytes Absolute: 636 cells/uL (ref 200–950)
Monocytes Relative: 12 %
NEUTROS ABS: 3074 {cells}/uL (ref 1500–7800)
NEUTROS PCT: 58 %
Platelets: 270 10*3/uL (ref 140–400)
RBC: 5.45 MIL/uL (ref 4.20–5.80)
RDW: 17.1 % — ABNORMAL HIGH (ref 11.0–15.0)
WBC: 5.3 10*3/uL (ref 3.8–10.8)

## 2016-06-14 LAB — COMPLETE METABOLIC PANEL WITH GFR
ALT: 28 U/L (ref 9–46)
AST: 22 U/L (ref 10–35)
Albumin: 4.3 g/dL (ref 3.6–5.1)
Alkaline Phosphatase: 49 U/L (ref 40–115)
BUN: 14 mg/dL (ref 7–25)
CHLORIDE: 105 mmol/L (ref 98–110)
CO2: 25 mmol/L (ref 20–31)
Calcium: 9.4 mg/dL (ref 8.6–10.3)
Creat: 1.03 mg/dL (ref 0.70–1.33)
GFR, EST NON AFRICAN AMERICAN: 80 mL/min (ref >=60)
GFR, Est African American: >89 mL/min (ref >=60)
GLUCOSE: 104 mg/dL — AB (ref 70–99)
POTASSIUM: 4.2 mmol/L (ref 3.5–5.3)
SODIUM: 138 mmol/L (ref 135–146)
Total Bilirubin: 0.8 mg/dL (ref 0.2–1.2)
Total Protein: 7 g/dL (ref 6.1–8.1)

## 2016-06-14 LAB — PSA: PSA: 0.3 ng/mL (ref <=4.0)

## 2016-06-14 NOTE — Progress Notes (Signed)
Subjective:    Patient ID: Derrick Todd, male    DOB: 10-09-1960, 56 y.o.   MRN: [id number]  HPI Was admitted to hospital.  I have copied relevant portions of the discharge summary and included them below for my reference:  Admit date: 03/30/2016 Discharge date: 03/30/2016  Primary Care Provider: [facility] Primary Cardiologist: Dr Shirlee Latch  Discharge Diagnoses    Active Problems:   Abnormal nuclear cardiac imaging test CAD Hyperlipidemia Type II Diabetes  Allergies      Allergies  Allergen Reactions  . Livalo [Pitavastatin] Other (See Comments)    Myalgias - leg cramps    Diagnostic Studies/Procedures    Procedures  03/30/2016 Coronary Stent Intervention  Left Heart Cath and Coronary Angiography  Conclusion   1. Severe LAD stenosis, treated successfully with PCI using a long drug-eluting stent platform 2. Continued patency of the stented segments in the right coronary artery 3. Severe stenosis of the distal left circumflex very small area of myocardium, appropriate for medical therapy 4. Normal LV systolic function  Recommendations: Continue dual antiplatelet therapy with aspirin and brilinta for 12 months as tolerated.. Aggressive medical therapy.   Note: The patient's LAD wraps the apex of the heart and likely accounts for both the anterior and infero-apical ischemia seen on his nuclear scan  Note: Avoid right radial approach if future cath is required    _____________   History of Present Illness     56 yo male with past medical history of CAD s/p NSTEMI in 08/2011 with BMS to totally occluded RCA, inferior STEMI in 03/2013 with BMS to 95% mid RCA lesion at [facility], HLD, and DM. He presents for  Echo (12/14) showed EF 50-55%. He has developed exertional chest pain relieved by SL Nitroglycerin.  [name] was done on 03/23/2016 showing 2 areas of ischemia in the RCA and mid LAD territories and suspicion for  multivessel disease. LVEF was mildly decreased to 44%. He was scheduled for an outpatient cardiac cath.  Hospital Course     Consultants: None  Patient presented for outpatient cardiac cath with findings of severe LAD stenosis, treated successfully with PCI using long DES, continued patency of RCA stents, severe stenosis of distal left circumflex feeding very small area of myocardium that is appropriate for medical therapy and normal LV systolic function.   The patient tolerated the procedure well. No immediate complications. Radial and r groin cath site stable.   Patient has been seen by Dr. Excell Seltzer today and deemed ready for discharge home. All follow up appointments have been scheduled. Discharge medications are listed below.  Plavix is being changed to Brilinta. Arrangements made for Brilinta to be delivered to patient prior to discharge through the [facility] program via [facility]. _____________  Here today for regular follow up.Patient feels much better since when I last saw him. However he states that he was feeling better even prior to receiving the 2 urinary stents. He truly believes that the testosterone was helping with his fatigue. He is compliant with his aspirin and brilinta.  He also states that he's taking his Jardiance laterally. We had a long discussion today regarding decreasing the risk of cardiovascular death and this medicine would certainly decrease his odds of future complications. His blood pressure today is well controlled. He denies any myalgias or right upper quadrant pain on his statin. He is checking his blood sugar infrequently but he typically states that his blood sugars less than 150.  He denies any hypoglycemia. He denies any polyuria, polydipsia, or blurred vision. Diabetic foot exam is performed today and is significant for week dorsalis pedis pulses in his left foot Past Medical History:  Diagnosis Date  . Abnormal nuclear cardiac  imaging test 03/30/2016  . CAD (coronary artery disease)    a. inferior STEMI 5/13:  LHC prox-mid LAD 20-30%, mid LAD 70%, pOM 70%, pRCA 20%, mid 40%, AM Br 50-60%, dRCA 70% then 100%.  EF 55%.  PCI:  BMS to the distal RCA.;  b. inf STEMI (03/2013 at [facility]): LHC -  mLAD 60%, dCFX 70-80, mRCA 95, dRCA stent ok.  PCI:  Vision (2.5 x 15 mm) BMS to the mRCA. C. 03/30/16 DES to LAD, prior stents patent, nl LVF  . DM2 (diabetes mellitus, type 2) (HCC)   . Dyslipidemia   . GERD (gastroesophageal reflux disease)   . Hx of echocardiogram    a. 2-D echocardiogram 09/02/11: Mild LVH, EF 55-60%, basal inferior HK, mild LAE, PASP 32.;   b. Echo (03/20/2013):  Mild TR, EF 50-55%, mild LVH.  Marland Kitchen Hyperlipidemia   . Myocardial infarction    2014 AND 2015  . Tobacco abuse    Past Surgical History:  Procedure Laterality Date  . CARDIAC CATHETERIZATION [name] 03/30/2016   Procedure: Left Heart Cath and Coronary Angiography;  Surgeon: [name], MD;  Location: [facility] INVASIVE CV LAB;  Service: Cardiovascular;  Laterality: [name];  . CARDIAC CATHETERIZATION [name] 03/30/2016   Procedure: Coronary Stent Intervention;  Surgeon: [name], MD;  Location: [facility] INVASIVE CV LAB;  Service: Cardiovascular;  Laterality: [name];  . LEFT HEART CATHETERIZATION WITH CORONARY ANGIOGRAM [name] 09/02/2011   Procedure: LEFT HEART CATHETERIZATION WITH CORONARY ANGIOGRAM;  Surgeon: Herby Abraham, MD;  Location: [facility] CATH LAB;  Service: Cardiovascular;  Laterality: [name];  . PERCUTANEOUS CORONARY STENT INTERVENTION (PCI-S) Right 09/02/2011   Procedure: PERCUTANEOUS CORONARY STENT INTERVENTION (PCI-S);  Surgeon: Herby Abraham, MD;  Location: [facility] CATH LAB;  Service: Cardiovascular;  Laterality: Right;  . TUMOR REMOVAL  2012   . Current Outpatient Prescriptions on File Prior to Visit  Medication Sig Dispense Refill  . ASPIR-LOW 81 MG EC tablet TAKE 1 TABLET (81 MG TOTAL) BY MOUTH DAILY. 30 tablet 1  . Coenzyme Q10 200 MG capsule Take 1  capsule (200 mg total) by mouth daily.    . furosemide (LASIX) 20 MG tablet Take 20 mg by mouth daily as needed for fluid or edema.    Marland Kitchen glipiZIDE (GLUCOTROL) 10 MG tablet TAKE 1 TABLET (10 MG TOTAL) BY MOUTH 2 (TWO) TIMES DAILY BEFORE A MEAL. 60 tablet PRN  . GLYXAMBI 25-5 MG TABS TAKE 1 TABLET BY MOUTH DAILY. 30 tablet PRN  . hydrOXYzine (VISTARIL) 25 MG capsule Take 25 mg by mouth at bedtime as needed (sleep).    Boris Lown Oil 500 MG CAPS Take 500 mg by mouth daily.    Marland Kitchen lisinopril (PRINIVIL,ZESTRIL) 5 MG tablet TAKE 1 TABLET (5 MG TOTAL) BY MOUTH DAILY. 90 tablet 3  . magnesium oxide (MAG-OX) 400 MG tablet Take 400 mg by mouth daily.    . metFORMIN (GLUCOPHAGE) 1000 MG tablet Take 1 tablet (1,000 mg total) by mouth 2 (two) times daily with a meal. 180 tablet 3  . metoprolol (LOPRESSOR) 50 MG tablet TAKE 1 TABLET (50 MG TOTAL) BY MOUTH 2 (TWO) TIMES DAILY. 180 tablet 3  . naproxen sodium (ANAPROX) 220 MG tablet Take 440 mg by mouth 2 (two) times  daily with a meal.    . nitroGLYCERIN (NITROSTAT) 0.4 MG SL tablet Place 1 tablet (0.4 mg total) under the tongue every 5 (five) minutes x 3 doses as needed for chest pain. 25 tablet 4  . Omega-3 Fatty Acids (FISH OIL) 1000 MG CAPS Take 1,000 mg by mouth daily.     . pantoprazole (PROTONIX) 40 MG tablet Take 40 mg by mouth daily.    . potassium chloride (K-DUR,KLOR-CON) 10 MEQ tablet Take 10 mEq by mouth daily as needed (Take along with Lasix).    . rosuvastatin (CRESTOR) 40 MG tablet Take 40 mg by mouth daily.    . sildenafil (VIAGRA) 50 MG tablet Take 1 tablet (50 mg total) by mouth daily as needed for erectile dysfunction. DO NOT TAKE NITROGLYCERIN WITHIN 24 HOURS OF TAKING VIAGRA 15 tablet 1  . Testosterone 20.25 MG/1.25GM (1.62%) GEL 2 pumps daily as per instructions 1.25 g 5  . ticagrelor (BRILINTA) 90 MG TABS tablet Take 1 tablet (90 mg total) by mouth 2 (two) times daily. 60 tablet 11  . Vitamin D, Cholecalciferol, 400 units TABS Take 400 Units by  mouth daily.      No current facility-administered medications on file prior to visit.    Allergies  Allergen Reactions  . Livalo [Pitavastatin] Other (See Comments)    Myalgias - leg cramps   Social History   Social History  . Marital status: Married    Spouse name: [name]  . Number of children: [name]  . Years of education: [name]   Occupational History  . Not on file.   Social History Main Topics  . Smoking status: Former Smoker    Types: Cigarettes    Quit date: 04/05/2011  . Smokeless tobacco: Never Used     Comment: Quit in 2014but Vape.   . Alcohol use Yes     Comment: Twice a week.   . Drug use: No  . Sexual activity: Yes    Birth control/ protection: None   Other Topics Concern  . Not on file   Social History Narrative  . No narrative on file      Review of Systems  All other systems reviewed and are negative.      Objective:   Physical Exam  Constitutional: He appears well-developed and well-nourished. No distress.  Eyes: Conjunctivae are normal. No scleral icterus.  Neck: Neck supple. No JVD present.  Cardiovascular: Normal rate, regular rhythm and normal heart sounds.   No murmur heard. Pulmonary/Chest: Effort normal and breath sounds normal. No respiratory distress. He has no wheezes. He has no rales.  Abdominal: Soft. Bowel sounds are normal. He exhibits no distension. There is no tenderness. There is no rebound and no guarding.  Musculoskeletal: He exhibits no edema.  Lymphadenopathy:    He has no cervical adenopathy.  Skin: No rash noted. He is not diaphoretic.  Vitals reviewed.         Assessment & Plan:  ASCVD (arteriosclerotic cardiovascular disease)  Pure hypercholesterolemia  Uncontrolled type 2 diabetes mellitus with hyperosmolarity without coma, without long-term current use of insulin (HCC) - Plan: CBC with Differential/Platelet, COMPLETE METABOLIC PANEL WITH GFR, Lipid panel, Microalbumin, urine, Hemoglobin A1c  Hypogonadism in  male - Plan: Testosterone, PSA  Chronic diastolic CHF (congestive heart failure) (HCC)  Patient is appropriately anticoagulated. His blood pressure is excellent. I'll check a fasting lipid panel. Goal LDL cholesterol is less than 70. I will check his hemoglobin A1c. Goal hemoglobin A1c is less than  7. Patient feels much better since taking testosterone. I will check his testosterone level today to ensure that we are not achieving supratherapeutic levels putting him at high risk for cardiovascular disease. I'll also monitor for prostate cancer with a PSA. I will check a urine microalbumin. I recommended a diabetic eye exam. Diabetic foot exam is significant for weak dorsalis pedis pulses in left foot. Given his vascular history, I will schedule the patient for arterial Dopplers with ABIs to evaluate for peripheral artery disease

## 2016-06-15 LAB — HEMOGLOBIN A1C
HEMOGLOBIN A1C: 6.4 % — AB (ref <5.7)
Mean Plasma Glucose: 137 mg/dL

## 2016-06-15 LAB — TESTOSTERONE: Testosterone: 846 ng/dL — ABNORMAL HIGH (ref 250–827)

## 2016-06-15 LAB — MICROALBUMIN, URINE: Microalb, Ur: 11 mg/dL

## 2016-06-15 MED FILL — GLYXAMBI 25 MG-5 MG TABLET: 25-5 | 30 days supply | Qty: 30 | Fill #4

## 2016-06-15 MED FILL — BRILINTA 90 MG TABLET: 90 | 30 days supply | Qty: 60 | Fill #1

## 2016-06-15 MED FILL — ESCITALOPRAM 10 MG TABLET: 10 | 30 days supply | Qty: 30 | Fill #3

## 2016-06-15 MED FILL — ANDROGEL 1.62% GEL PUMP: 20.25 MG/AC | 30 days supply | Qty: 75 | Fill #3

## 2016-06-15 MED FILL — ROSUVASTATIN CALCIUM 40 MG: 40 | 30 days supply | Qty: 30 | Fill #1

## 2016-06-16 ENCOUNTER — Encounter: Payer: Self-pay | Admitting: Family Medicine

## 2016-06-16 ENCOUNTER — Other Ambulatory Visit: Payer: Self-pay | Admitting: *Deleted

## 2016-06-16 MED ORDER — TESTOSTERONE 20.25 MG/1.25GM (1.62%) TD GEL: TRANSDERMAL | 5 refills | Status: DC

## 2016-06-16 MED FILL — glipiZIDE 10 MG TABS: 10 | 30 days supply | Qty: 60 | Fill #6

## 2016-06-20 ENCOUNTER — Other Ambulatory Visit: Payer: Self-pay | Admitting: Family Medicine

## 2016-06-20 DIAGNOSIS — R0989 Other specified symptoms and signs involving the circulatory and respiratory systems: Secondary | ICD-10-CM

## 2016-06-27 ENCOUNTER — Telehealth: Payer: Self-pay | Admitting: *Deleted

## 2016-06-27 NOTE — Telephone Encounter (Signed)
Received request from pharmacy for PA on Androgel.   PA submitted.   Dx: E29.1- testicular hypofunction.  Your PA has been faxed to the plan as a paper copy. Please contact the plan directly if you haven't received a determination in a typical timeframe. You will be notified of the determination via fax.

## 2016-06-30 NOTE — Telephone Encounter (Signed)
Received PA determination.  ° °PA approved.  ° °Pharmacy made aware.  °

## 2016-07-20 ENCOUNTER — Other Ambulatory Visit: Payer: Self-pay | Admitting: Cardiology

## 2016-07-20 MED FILL — ANDROGEL 1.62% GEL PUMP: 20.25 MG/AC | 30 days supply | Qty: 75 | Fill #4

## 2016-07-20 MED FILL — BRILINTA 90 MG TABLET: 90 | 30 days supply | Qty: 60 | Fill #2

## 2016-07-20 MED FILL — GLYXAMBI 25 MG-5 MG TABLET: 25-5 | 30 days supply | Qty: 30 | Fill #5

## 2016-07-20 MED FILL — glipiZIDE 10 MG TABS: 10 | 30 days supply | Qty: 60 | Fill #7

## 2016-07-21 MED FILL — ROSUVASTATIN CALCIUM 40 MG: 40 | 90 days supply | Qty: 90 | Fill #0

## 2016-08-03 DIAGNOSIS — H5213 Myopia, bilateral: Secondary | ICD-10-CM | POA: Diagnosis not present

## 2016-08-03 DIAGNOSIS — H524 Presbyopia: Secondary | ICD-10-CM | POA: Diagnosis not present

## 2016-08-03 DIAGNOSIS — H52223 Regular astigmatism, bilateral: Secondary | ICD-10-CM | POA: Diagnosis not present

## 2016-08-22 MED FILL — BRILINTA 90 MG TABLET: 90 | 30 days supply | Qty: 60 | Fill #3

## 2016-08-22 MED FILL — GLYXAMBI 25 MG-5 MG TABLET: 25-5 | 30 days supply | Qty: 30 | Fill #6

## 2016-09-12 MED FILL — SULFAMETHOXAZOLE/TMP DS TAB: 800-160 | 10 days supply | Qty: 20 | Fill #0

## 2016-09-15 ENCOUNTER — Ambulatory Visit (HOSPITAL_COMMUNITY): Payer: 59

## 2016-09-26 ENCOUNTER — Other Ambulatory Visit: Payer: Self-pay | Admitting: Family Medicine

## 2016-09-26 MED FILL — METOPROLOL TARTRATE 50 MG T: 50 | 90 days supply | Qty: 180 | Fill #3

## 2016-09-26 MED FILL — BRILINTA 90 MG TABLET: 90 | 30 days supply | Qty: 60 | Fill #4

## 2016-09-26 MED FILL — GLYXAMBI 25 MG-5 MG TABLET: 25-5 | 30 days supply | Qty: 30 | Fill #7

## 2016-09-26 MED FILL — PANTOPRAZOLE SOD DR 40 MG T: 40 | 90 days supply | Qty: 90 | Fill #3

## 2016-09-26 MED FILL — LISINOPRIL 5 MG TABLET: 5 | 90 days supply | Qty: 90 | Fill #3

## 2016-09-26 MED FILL — metFORMIN HCL 1000 MG TABS: 1000 | 90 days supply | Qty: 180 | Fill #2

## 2016-09-26 NOTE — Telephone Encounter (Signed)
Okay to refill, he should be on 1 pump a day

## 2016-09-26 NOTE — Telephone Encounter (Signed)
Ok to refill 

## 2016-09-28 MED FILL — glipiZIDE 10 MG TABS: 10 | 30 days supply | Qty: 60 | Fill #0

## 2016-10-17 MED FILL — ANDROGEL 1.62% GEL PUMP: 20.25 MG/AC | 60 days supply | Qty: 75 | Fill #0

## 2016-10-24 MED FILL — glipiZIDE 10 MG TABS: 10 | 30 days supply | Qty: 60 | Fill #1

## 2016-10-27 MED FILL — GLYXAMBI 25 MG-5 MG TABLET: 25-5 | 30 days supply | Qty: 30 | Fill #8

## 2016-11-04 ENCOUNTER — Ambulatory Visit (HOSPITAL_COMMUNITY)
Admission: RE | Admit: 2016-11-04 | Discharge: 2016-11-04 | Disposition: A | Discharge status: Home / Self Care | Payer: 59 | Source: Ambulatory Visit | Attending: Vascular Surgery | Admitting: Vascular Surgery

## 2016-11-04 DIAGNOSIS — I771 Stricture of artery: Secondary | ICD-10-CM | POA: Insufficient documentation

## 2016-11-04 DIAGNOSIS — R0989 Other specified symptoms and signs involving the circulatory and respiratory systems: Secondary | ICD-10-CM

## 2016-11-04 LAB — VAS US LOWER EXTREMITY ARTERIAL DUPLEX
RATIBDISTSYS: 63 cm/s
RIGHT POST TIB DIST SYS: 61 cm/s
RPOPDPSV: -38 cm/s
RPOPPPSV: 36 cm/s
Right peroneal sys PSV: 26 cm/s
Right super femoral dist sys PSV: -51 cm/s
Right super femoral prox sys PSV: -63 cm/s

## 2016-11-14 MED FILL — ROSUVASTATIN CALCIUM 40 MG: 40 | 90 days supply | Qty: 90 | Fill #1

## 2016-11-15 ENCOUNTER — Telehealth: Payer: Self-pay | Admitting: Family Medicine

## 2016-11-15 NOTE — Telephone Encounter (Signed)
Provider rec'd final report about vascular studies on patient's lower extremities .  "No significant blockages" per provider.  Pt contacted and made aware.  Pt asked did you want to do Carotid Dopplers??

## 2016-11-15 NOTE — Telephone Encounter (Signed)
I made no mention of that in his last note.  Only arterial dopplers of the legs.

## 2016-11-16 ENCOUNTER — Other Ambulatory Visit: Payer: Self-pay | Admitting: Family Medicine

## 2016-11-16 DIAGNOSIS — E785 Hyperlipidemia, unspecified: Secondary | ICD-10-CM

## 2016-11-16 DIAGNOSIS — I509 Heart failure, unspecified: Secondary | ICD-10-CM

## 2016-11-16 DIAGNOSIS — Z79899 Other long term (current) drug therapy: Secondary | ICD-10-CM

## 2016-11-16 DIAGNOSIS — E291 Testicular hypofunction: Secondary | ICD-10-CM

## 2016-11-16 DIAGNOSIS — D509 Iron deficiency anemia, unspecified: Secondary | ICD-10-CM

## 2016-11-16 DIAGNOSIS — E119 Type 2 diabetes mellitus without complications: Secondary | ICD-10-CM

## 2016-11-16 NOTE — Telephone Encounter (Signed)
Pt aware of provider response

## 2016-12-14 ENCOUNTER — Other Ambulatory Visit: Payer: 59

## 2016-12-14 DIAGNOSIS — D509 Iron deficiency anemia, unspecified: Secondary | ICD-10-CM

## 2016-12-14 DIAGNOSIS — E785 Hyperlipidemia, unspecified: Secondary | ICD-10-CM

## 2016-12-14 DIAGNOSIS — E119 Type 2 diabetes mellitus without complications: Secondary | ICD-10-CM

## 2016-12-14 DIAGNOSIS — I509 Heart failure, unspecified: Secondary | ICD-10-CM | POA: Diagnosis not present

## 2016-12-14 DIAGNOSIS — E291 Testicular hypofunction: Secondary | ICD-10-CM

## 2016-12-14 DIAGNOSIS — Z79899 Other long term (current) drug therapy: Secondary | ICD-10-CM

## 2016-12-15 ENCOUNTER — Other Ambulatory Visit: Payer: Self-pay

## 2016-12-15 LAB — COMPLETE METABOLIC PANEL WITH GFR
AG Ratio: 1.5 (calc) (ref 1.0–2.5)
ALKALINE PHOSPHATASE (APISO): 49 U/L (ref 40–115)
ALT: 21 U/L (ref 9–46)
AST: 22 U/L (ref 10–35)
Albumin: 4.1 g/dL (ref 3.6–5.1)
BUN/Creatinine Ratio: 15 (calc) (ref 6–22)
BUN: 20 mg/dL (ref 7–25)
CO2: 27 mmol/L (ref 20–32)
CREATININE: 1.34 mg/dL — AB (ref 0.70–1.33)
Calcium: 9.5 mg/dL (ref 8.6–10.3)
Chloride: 105 mmol/L (ref 98–110)
GFR, Est African American: 68 mL/min/{1.73_m2} (ref >=60)
GFR, Est Non African American: 58 mL/min/{1.73_m2} — ABNORMAL LOW (ref >=60)
Globulin: 2.7 g/dL (calc) (ref 1.9–3.7)
Glucose, Bld: 84 mg/dL (ref 65–99)
Potassium: 3.9 mmol/L (ref 3.5–5.3)
SODIUM: 139 mmol/L (ref 135–146)
Total Bilirubin: 0.7 mg/dL (ref 0.2–1.2)
Total Protein: 6.8 g/dL (ref 6.1–8.1)

## 2016-12-15 LAB — CBC WITH DIFFERENTIAL/PLATELET
BASOS ABS: 68 {cells}/uL (ref 0–200)
BASOS PCT: 1.3 %
EOS ABS: 380 {cells}/uL (ref 15–500)
Eosinophils Relative: 7.3 %
HCT: 38.7 % (ref 38.5–50.0)
HEMOGLOBIN: 12.1 g/dL — AB (ref 13.2–17.1)
Lymphs Abs: 1165 cells/uL (ref 850–3900)
MCH: 23.7 pg — AB (ref 27.0–33.0)
MCHC: 31.3 g/dL — AB (ref 32.0–36.0)
MCV: 75.7 fL — AB (ref 80.0–100.0)
MPV: 9.2 fL (ref 7.5–12.5)
Monocytes Relative: 14.1 %
NEUTROS ABS: 2855 {cells}/uL (ref 1500–7800)
Neutrophils Relative %: 54.9 %
Platelets: 259 10*3/uL (ref 140–400)
RBC: 5.11 10*6/uL (ref 4.20–5.80)
RDW: 15.7 % — ABNORMAL HIGH (ref 11.0–15.0)
TOTAL LYMPHOCYTE: 22.4 %
WBC: 5.2 10*3/uL (ref 3.8–10.8)
WBCMIX: 733 {cells}/uL (ref 200–950)

## 2016-12-15 LAB — HEMOGLOBIN A1C
HEMOGLOBIN A1C: 6.1 %{Hb} — AB (ref <5.7)
Mean Plasma Glucose: 128 (calc)
eAG (mmol/L): 7.1 (calc)

## 2016-12-15 LAB — LIPID PANEL
CHOL/HDL RATIO: 2.9 (calc) (ref <5.0)
Cholesterol: 126 mg/dL (ref <200)
HDL: 44 mg/dL (ref >40)
LDL Cholesterol (Calc): 60 mg/dL (calc)
NON-HDL CHOLESTEROL (CALC): 82 mg/dL (ref <130)
Triglycerides: 134 mg/dL (ref <150)

## 2016-12-15 LAB — TESTOSTERONE: TESTOSTERONE: 235 ng/dL — AB (ref 250–827)

## 2016-12-17 ENCOUNTER — Encounter: Payer: Self-pay | Admitting: Family Medicine

## 2016-12-19 ENCOUNTER — Ambulatory Visit: Payer: Self-pay | Admitting: Family Medicine

## 2016-12-22 ENCOUNTER — Other Ambulatory Visit: Payer: Self-pay | Admitting: Cardiology

## 2016-12-22 MED FILL — BRILINTA 90 MG TABLET: 90 | 30 days supply | Qty: 60 | Fill #5

## 2016-12-22 MED FILL — metFORMIN HCL 1000 MG TABS: 1000 | 90 days supply | Qty: 180 | Fill #3

## 2016-12-22 MED FILL — GLYXAMBI 25 MG-5 MG TABLET: 25-5 | 30 days supply | Qty: 30 | Fill #9

## 2016-12-22 MED FILL — ANDROGEL 1.62% GEL PUMP: 20.25 MG/AC | 60 days supply | Qty: 75 | Fill #1

## 2016-12-23 ENCOUNTER — Encounter: Payer: Self-pay | Admitting: Family Medicine

## 2016-12-23 ENCOUNTER — Other Ambulatory Visit: Payer: Self-pay | Admitting: Family Medicine

## 2016-12-23 ENCOUNTER — Ambulatory Visit (INDEPENDENT_AMBULATORY_CARE_PROVIDER_SITE_OTHER): Payer: 59 | Admitting: Family Medicine

## 2016-12-23 VITALS — BP 114/68 | HR 84 | Temp 97.9°F | Resp 18 | Wt 229.0 lb

## 2016-12-23 DIAGNOSIS — Z23 Encounter for immunization: Secondary | ICD-10-CM

## 2016-12-23 DIAGNOSIS — E7800 Pure hypercholesterolemia, unspecified: Secondary | ICD-10-CM

## 2016-12-23 DIAGNOSIS — I2510 Atherosclerotic heart disease of native coronary artery without angina pectoris: Secondary | ICD-10-CM

## 2016-12-23 DIAGNOSIS — Z720 Tobacco use: Secondary | ICD-10-CM

## 2016-12-23 DIAGNOSIS — E291 Testicular hypofunction: Secondary | ICD-10-CM | POA: Diagnosis not present

## 2016-12-23 DIAGNOSIS — E119 Type 2 diabetes mellitus without complications: Secondary | ICD-10-CM

## 2016-12-23 MED ORDER — GLIPIZIDE ER 10 MG PO TB24: 10.0000 mg | ORAL_TABLET | Freq: Every day | ORAL | 3 refills | Status: DC

## 2016-12-23 MED ORDER — SILDENAFIL CITRATE 50 MG PO TABS: 50.0000 mg | ORAL_TABLET | Freq: Every day | ORAL | 1 refills | Status: DC | PRN

## 2016-12-23 MED ORDER — TESTOSTERONE 20.25 MG/ACT (1.62%) TD GEL: TRANSDERMAL | 0 refills | Status: DC

## 2016-12-23 MED ORDER — EMPAGLIFLOZIN-LINAGLIPTIN 25-5 MG PO TABS: 1.0000 | ORAL_TABLET | Freq: Every day | ORAL | 3 refills | Status: DC

## 2016-12-23 MED ORDER — METFORMIN HCL 1000 MG PO TABS: 1000.0000 mg | ORAL_TABLET | Freq: Two times a day (BID) | ORAL | 3 refills | Status: DC

## 2016-12-23 MED FILL — glipiZIDE ER 10 MG TB24: 10 | 90 days supply | Qty: 90 | Fill #0

## 2016-12-23 NOTE — Telephone Encounter (Signed)
This pt is suppose to see Dr. Shirlee Latch at [facility], Dr. Shirlee Latch is still this pt's doctor. Please address.

## 2016-12-23 NOTE — Addendum Note (Signed)
Addended by: Donne Anon on: 12/23/2016 09:35 AM   Modules accepted: Orders

## 2016-12-23 NOTE — Telephone Encounter (Signed)
Testosterone called in for 18 G which is a 60 day supply as he only uses 1 pump daily.  Unable to give 90 day supply because pharmacy can not break up a package.  Tow boxes would exceed the the allowable amount for a controlled substance.

## 2016-12-23 NOTE — Progress Notes (Signed)
Subjective:    Patient ID: Derrick Todd, male    DOB: 22-Sep-1960, 56 y.o.   MRN: [id number]  HPI Was admitted to hospital.  I have copied relevant portions of the discharge summary and included them below for my reference:  Admit date: 03/30/2016 Discharge date: 03/30/2016  Primary Care Provider: [facility] Primary Cardiologist: Dr Shirlee Latch  Discharge Diagnoses    Active Problems:   Abnormal nuclear cardiac imaging test CAD Hyperlipidemia Type II Diabetes  Allergies      Allergies  Allergen Reactions  . Livalo [Pitavastatin] Other (See Comments)    Myalgias - leg cramps    Diagnostic Studies/Procedures    Procedures  03/30/2016 Coronary Stent Intervention  Left Heart Cath and Coronary Angiography  Conclusion   1. Severe LAD stenosis, treated successfully with PCI using a long drug-eluting stent platform 2. Continued patency of the stented segments in the right coronary artery 3. Severe stenosis of the distal left circumflex very small area of myocardium, appropriate for medical therapy 4. Normal LV systolic function  Recommendations: Continue dual antiplatelet therapy with aspirin and brilinta for 12 months as tolerated.. Aggressive medical therapy.   Note: The patient's LAD wraps the apex of the heart and likely accounts for both the anterior and infero-apical ischemia seen on his nuclear scan  Note: Avoid right radial approach if future cath is required    _____________   History of Present Illness     56 yo male with past medical history of CAD s/p NSTEMI in 08/2011 with BMS to totally occluded RCA, inferior STEMI in 03/2013 with BMS to 95% mid RCA lesion at [facility], HLD, and DM. He presents for  Echo (12/14) showed EF 50-55%. He has developed exertional chest pain relieved by SL Nitroglycerin.  Derrick Todd was done on 03/23/2016 showing 2 areas of ischemia in the RCA and mid LAD territories and suspicion for  multivessel disease. LVEF was mildly decreased to 44%. He was scheduled for an outpatient cardiac cath.  Hospital Course     Consultants: None  Patient presented for outpatient cardiac cath with findings of severe LAD stenosis, treated successfully with PCI using long DES, continued patency of RCA stents, severe stenosis of distal left circumflex feeding very small area of myocardium that is appropriate for medical therapy and normal LV systolic function.   The patient tolerated the procedure well. No immediate complications. Radial and r groin cath site stable.   Patient has been seen by Dr. Excell Seltzer today and deemed ready for discharge home. All follow up appointments have been scheduled. Discharge medications are listed below.  Plavix is being changed to Brilinta. Arrangements made for Brilinta to be delivered to patient prior to discharge through the [facility] program via [facility] Out[facility] pharmacy. _____________ 06/14/16 Here today for regular follow up. Patient feels much better since when I last saw him. However he states that he was feeling better even prior to receiving the 2 urinary stents. He truly believes that the testosterone was helping with his fatigue. He is compliant with his aspirin and brilinta.  He also states that he's taking his Jardiance laterally. We had a long discussion today regarding decreasing the risk of cardiovascular death and this medicine would certainly decrease his odds of future complications. His blood pressure today is well controlled. He denies any myalgias or right upper quadrant pain on his statin. He is checking his blood sugar infrequently but he typically states that his blood sugars less than  150. He denies any hypoglycemia. He denies any polyuria, polydipsia, or blurred vision. Diabetic foot exam is performed today and is significant for week dorsalis pedis pulses in his left foot. At that time, my plan was: Patient is appropriately  anticoagulated. His blood pressure is excellent. I'll check a fasting lipid panel. Goal LDL cholesterol is less than 70. I will check his hemoglobin A1c. Goal hemoglobin A1c is less than 7. Patient feels much better since taking testosterone. I will check his testosterone level today to ensure that we are not achieving patient is here today for follow-up supratherapeutic levels putting him at high risk for cardiovascular disease. I'll also monitor for prostate cancer with a PSA. I will check a urine microalbumin. I recommended a diabetic eye exam. Diabetic foot exam is significant for weak dorsalis pedis pulses in left foot. Given his vascular history, I will schedule the patient for arterial Dopplers with ABIs to evaluate for peripheral artery disease  12/23/16 The patient is here today for follow-up.  Since his last visit, arterial Dopplers were obtained of both legs and did reveal a mildly depressed ABI in his left leg but no significant blockage was seen. He denies any claudication and therefore my plan is to manage this medically. Recently had lab work. Hemoglobin A1c is 6.1. He does have occasions of hypoglycemia when he misses meals. He is truly taking glipizide 10 mg 3 times a day. I suggested that we switch this to an extended release 10 mg once a day to prevent hypoglycemia. His LDL cholesterol is 60 and well controlled. He denies any myalgias or right upper quadrant pain. His blood pressure today is outstanding. He denies any chest pain shortness of breath or dyspnea on exertion. He is due for a flu shot. However his testosterone level remains low. For some reason, the patient is using topical testosterone every other day and is not applying the gel once a day as directed. Past Medical History:  Diagnosis Date  . Abnormal nuclear cardiac imaging test 03/30/2016  . CAD (coronary artery disease)    a. inferior STEMI 5/13:  LHC prox-mid LAD 20-30%, mid LAD 70%, pOM 70%, pRCA 20%, mid 40%, AM Br  50-60%, dRCA 70% then 100%.  EF 55%.  PCI:  BMS to the distal RCA.;  b. inf STEMI (03/2013 at [facility]): LHC -  mLAD 60%, dCFX 70-80, mRCA 95, dRCA stent ok.  PCI:  Vision (2.5 x 15 mm) BMS to the mRCA. C. 03/30/16 DES to LAD, prior stents patent, nl LVF  . DM2 (diabetes mellitus, type 2) (HCC)   . Dyslipidemia   . GERD (gastroesophageal reflux disease)   . Hx of echocardiogram    a. 2-D echocardiogram 09/02/11: Mild LVH, EF 55-60%, basal inferior HK, mild LAE, PASP 32.;   b. Echo (03/20/2013):  Mild TR, EF 50-55%, mild LVH.  Marland Kitchen Hyperlipidemia   . Myocardial infarction    2014 AND 2015  . Tobacco abuse    Past Surgical History:  Procedure Laterality Date  . CARDIAC CATHETERIZATION [name] 03/30/2016   Procedure: Left Heart Cath and Coronary Angiography;  Surgeon: [name], MD;  [facility] INVASIVE CV LAB;  Service: Cardiovascular;  Laterality: [name];  . CARDIAC CATHETERIZATION [name] 03/30/2016   Procedure: Coronary Stent Intervention;  Surgeon: [name], MD;  Location: [facility] INVASIVE CV LAB;  Service: Cardiovascular;  Laterality: [name];  . LEFT HEART CATHETERIZATION WITH CORONARY ANGIOGRAM [name] 09/02/2011   Procedure: LEFT HEART CATHETERIZATION WITH CORONARY ANGIOGRAM;  Surgeon: Herby Abraham, MD;  Location: [facility] CATH LAB;  Service: Cardiovascular;  Laterality: [name];  . PERCUTANEOUS CORONARY STENT INTERVENTION (PCI-S) Right 09/02/2011   Procedure: PERCUTANEOUS CORONARY STENT INTERVENTION (PCI-S);  Surgeon: Herby Abraham, MD;  Location: [facility] CATH LAB;  Service: Cardiovascular;  Laterality: Right;  . TUMOR REMOVAL  2012   . Current Outpatient Prescriptions on File Prior to Visit  Medication Sig Dispense Refill  . ASPIR-LOW 81 MG EC tablet TAKE 1 TABLET (81 MG TOTAL) BY MOUTH DAILY. 30 tablet 1  . Coenzyme Q10 200 MG capsule Take 1 capsule (200 mg total) by mouth daily.    . furosemide (LASIX) 20 MG tablet Take 20 mg by mouth daily as needed for fluid or edema.    Marland Kitchen glipiZIDE  (GLUCOTROL) 10 MG tablet TAKE 1 TABLET BY MOUTH 2 TIMES DAILY BEFORE A MEAL. 60 tablet 3  . GLYXAMBI 25-5 MG TABS TAKE 1 TABLET BY MOUTH DAILY. 30 tablet PRN  . hydrOXYzine (VISTARIL) 25 MG capsule Take 25 mg by mouth at bedtime as needed (sleep).    Boris Lown Oil 500 MG CAPS Take 500 mg by mouth daily.    Marland Kitchen lisinopril (PRINIVIL,ZESTRIL) 5 MG tablet TAKE 1 TABLET (5 MG TOTAL) BY MOUTH DAILY. 90 tablet 3  . magnesium oxide (MAG-OX) 400 MG tablet Take 400 mg by mouth daily.    . metFORMIN (GLUCOPHAGE) 1000 MG tablet Take 1 tablet (1,000 mg total) by mouth 2 (two) times daily with a meal. 180 tablet 3  . metoprolol (LOPRESSOR) 50 MG tablet TAKE 1 TABLET (50 MG TOTAL) BY MOUTH 2 (TWO) TIMES DAILY. 180 tablet 3  . naproxen sodium (ANAPROX) 220 MG tablet Take 440 mg by mouth 2 (two) times daily with a meal.    . nitroGLYCERIN (NITROSTAT) 0.4 MG SL tablet Place 1 tablet (0.4 mg total) under the tongue every 5 (five) minutes x 3 doses as needed for chest pain. 25 tablet 4  . Omega-3 Fatty Acids (FISH OIL) 1000 MG CAPS Take 1,000 mg by mouth daily.     . pantoprazole (PROTONIX) 40 MG tablet Take 40 mg by mouth daily.    . potassium chloride (K-DUR,KLOR-CON) 10 MEQ tablet Take 10 mEq by mouth daily as needed (Take along with Lasix).    . rosuvastatin (CRESTOR) 40 MG tablet TAKE 1 TABLET BY MOUTH DAILY. 30 tablet 8  . sildenafil (VIAGRA) 50 MG tablet Take 1 tablet (50 mg total) by mouth daily as needed for erectile dysfunction. DO NOT TAKE NITROGLYCERIN WITHIN 24 HOURS OF TAKING VIAGRA 15 tablet 1  . Testosterone (ANDROGEL PUMP) 20.25 MG/ACT (1.62%) GEL Apply 1 pump daily as directed 75 g 5  . Testosterone 20.25 MG/1.25GM (1.62%) GEL 1 pump daily as per instructions 1.25 g 5  . ticagrelor (BRILINTA) 90 MG TABS tablet Take 1 tablet (90 mg total) by mouth 2 (two) times daily. 60 tablet 11  . Vitamin D, Cholecalciferol, 400 units TABS Take 400 Units by mouth daily.      No current facility-administered  medications on file prior to visit.    Allergies  Allergen Reactions  . Livalo [Pitavastatin] Other (See Comments)    Myalgias - leg cramps   Social History   Social History  . Marital status: Married    Spouse name: [name]  . Number of children: [name]  . Years of education: [name]   Occupational History  . Not on file.   Social History Main Topics  . Smoking status:  Former Smoker    Types: Cigarettes    Quit date: 04/05/2011  . Smokeless tobacco: Never Used     Comment: Quit in 2014but Vape.   . Alcohol use Yes     Comment: Twice a week.   . Drug use: No  . Sexual activity: Yes    Birth control/ protection: None   Other Topics Concern  . Not on file   Social History Narrative  . No narrative on file      Review of Systems  All other systems reviewed and are negative.      Objective:   Physical Exam  Constitutional: He appears well-developed and well-nourished. No distress.  Eyes: Conjunctivae are normal. No scleral icterus.  Neck: Neck supple. No JVD present.  Cardiovascular: Normal rate, regular rhythm and normal heart sounds.   No murmur heard. Pulmonary/Chest: Effort normal and breath sounds normal. No respiratory distress. He has no wheezes. He has no rales.  Abdominal: Soft. Bowel sounds are normal. He exhibits no distension. There is no tenderness. There is no rebound and no guarding.  Musculoskeletal: He exhibits no edema.  Lymphadenopathy:    He has no cervical adenopathy.  Skin: No rash noted. He is not diaphoretic.  Vitals reviewed.         Assessment & Plan:  ASCVD (arteriosclerotic cardiovascular disease)  Pure hypercholesterolemia  Controlled type 2 diabetes mellitus without complication, without long-term current use of insulin (HCC)  Hypogonadism in male  Blood pressure is outstanding. I will make no changes in his medication. His LDL cholesterol is below his goal of 70. His hemoglobin A1c is outstanding. Decrease glipizide to glipizide  extended release 10 mg once a day and recheck in 6 months. He does have signs of peripheral vascular disease but at the present time is asymptomatic. Therefore we will focus on medical management by controlling his blood pressure cholesterol and blood sugar. He received his flu shot today. I did recommend that he increase his testosterone frequency to daily. Reassess in 6 months or as needed.

## 2016-12-28 ENCOUNTER — Encounter: Payer: Self-pay | Admitting: Family Medicine

## 2017-01-30 ENCOUNTER — Encounter (HOSPITAL_COMMUNITY): Payer: Self-pay | Admitting: Cardiology

## 2017-01-30 MED FILL — GLYXAMBI 25 MG-5 MG TABLET: 25-5 | 90 days supply | Qty: 90 | Fill #0

## 2017-01-30 MED FILL — ROSUVASTATIN CALCIUM 40 MG: 40 | 90 days supply | Qty: 90 | Fill #2

## 2017-01-30 MED FILL — BRILINTA 90 MG TABLET: 90 | 30 days supply | Qty: 60 | Fill #6

## 2017-01-31 ENCOUNTER — Other Ambulatory Visit (HOSPITAL_COMMUNITY): Payer: Self-pay | Admitting: *Deleted

## 2017-01-31 MED ORDER — PANTOPRAZOLE SODIUM 40 MG PO TBEC: 40.0000 mg | DELAYED_RELEASE_TABLET | Freq: Every day | ORAL | 3 refills | Status: DC

## 2017-01-31 MED ORDER — LISINOPRIL 5 MG PO TABS: ORAL_TABLET | ORAL | 3 refills | Status: DC

## 2017-01-31 MED ORDER — METOPROLOL TARTRATE 50 MG PO TABS: ORAL_TABLET | ORAL | 3 refills | Status: DC

## 2017-01-31 MED FILL — PANTOPRAZOLE SOD DR 40 MG T: 40 | 90 days supply | Qty: 90 | Fill #0

## 2017-01-31 MED FILL — LISINOPRIL 5 MG TABLET: 5 | 90 days supply | Qty: 90 | Fill #0

## 2017-01-31 MED FILL — METOPROLOL TARTRATE 50 MG T: 50 | 90 days supply | Qty: 180 | Fill #0

## 2017-02-08 ENCOUNTER — Encounter: Payer: Self-pay | Admitting: Family Medicine

## 2017-02-08 ENCOUNTER — Telehealth: Payer: Self-pay | Admitting: Family Medicine

## 2017-02-08 NOTE — Telephone Encounter (Signed)
Patient needs copy of his shot record Please call when ready to pick up [phone]

## 2017-02-08 NOTE — Telephone Encounter (Signed)
Printed and left up front - pt aware via mychart

## 2017-02-09 ENCOUNTER — Telehealth (HOSPITAL_COMMUNITY): Payer: Self-pay

## 2017-02-09 NOTE — Telephone Encounter (Signed)
Attempted to call patient in regards to Cardiac Rehab - Mailbox is full

## 2017-03-21 ENCOUNTER — Encounter: Payer: Self-pay | Admitting: Family Medicine

## 2017-03-21 ENCOUNTER — Ambulatory Visit (HOSPITAL_COMMUNITY)
Admission: RE | Admit: 2017-03-21 | Discharge: 2017-03-21 | Disposition: A | Discharge status: Home / Self Care | Payer: 59 | Source: Ambulatory Visit | Attending: Cardiology | Admitting: Cardiology

## 2017-03-21 VITALS — BP 118/76 | HR 75 | Wt 230.0 lb

## 2017-03-21 DIAGNOSIS — I2582 Chronic total occlusion of coronary artery: Secondary | ICD-10-CM | POA: Diagnosis not present

## 2017-03-21 DIAGNOSIS — Z7984 Long term (current) use of oral hypoglycemic drugs: Secondary | ICD-10-CM | POA: Diagnosis not present

## 2017-03-21 DIAGNOSIS — E785 Hyperlipidemia, unspecified: Secondary | ICD-10-CM | POA: Insufficient documentation

## 2017-03-21 DIAGNOSIS — N529 Male erectile dysfunction, unspecified: Secondary | ICD-10-CM | POA: Diagnosis not present

## 2017-03-21 DIAGNOSIS — I5032 Chronic diastolic (congestive) heart failure: Secondary | ICD-10-CM | POA: Diagnosis not present

## 2017-03-21 DIAGNOSIS — I252 Old myocardial infarction: Secondary | ICD-10-CM | POA: Insufficient documentation

## 2017-03-21 DIAGNOSIS — E119 Type 2 diabetes mellitus without complications: Secondary | ICD-10-CM | POA: Insufficient documentation

## 2017-03-21 DIAGNOSIS — Z87891 Personal history of nicotine dependence: Secondary | ICD-10-CM | POA: Insufficient documentation

## 2017-03-21 DIAGNOSIS — Z7982 Long term (current) use of aspirin: Secondary | ICD-10-CM | POA: Diagnosis not present

## 2017-03-21 DIAGNOSIS — I739 Peripheral vascular disease, unspecified: Secondary | ICD-10-CM | POA: Insufficient documentation

## 2017-03-21 DIAGNOSIS — I2510 Atherosclerotic heart disease of native coronary artery without angina pectoris: Secondary | ICD-10-CM | POA: Insufficient documentation

## 2017-03-21 DIAGNOSIS — M791 Myalgia, unspecified site: Secondary | ICD-10-CM | POA: Diagnosis not present

## 2017-03-21 DIAGNOSIS — Z8249 Family history of ischemic heart disease and other diseases of the circulatory system: Secondary | ICD-10-CM | POA: Diagnosis not present

## 2017-03-21 DIAGNOSIS — Z7901 Long term (current) use of anticoagulants: Secondary | ICD-10-CM | POA: Insufficient documentation

## 2017-03-21 DIAGNOSIS — Z79899 Other long term (current) drug therapy: Secondary | ICD-10-CM | POA: Insufficient documentation

## 2017-03-21 LAB — BASIC METABOLIC PANEL
Anion gap: 8 (ref 5–15)
BUN: 16 mg/dL (ref 6–20)
CHLORIDE: 108 mmol/L (ref 101–111)
CO2: 23 mmol/L (ref 22–32)
CREATININE: 1.11 mg/dL (ref 0.61–1.24)
Calcium: 9 mg/dL (ref 8.9–10.3)
GFR calc Af Amer: >60 mL/min (ref >60)
GFR calc non Af Amer: >60 mL/min (ref >60)
GLUCOSE: 77 mg/dL (ref 65–99)
POTASSIUM: 3.7 mmol/L (ref 3.5–5.1)
SODIUM: 139 mmol/L (ref 135–145)

## 2017-03-21 LAB — LIPID PANEL
CHOL/HDL RATIO: 3.1 ratio
Cholesterol: 148 mg/dL (ref 0–200)
HDL: 48 mg/dL (ref >40)
LDL Cholesterol: 77 mg/dL (ref 0–99)
TRIGLYCERIDES: 114 mg/dL (ref <150)
VLDL: 23 mg/dL (ref 0–40)

## 2017-03-21 NOTE — Patient Instructions (Addendum)
Avoid Naproxen  On Janurary 1, 2019  Stop Brilinta   Start Xarelto 2.5 mg (1 tab), twice a day    Continue taking Asprin 81 mg daily  Labs drawn today (if we do not call you, then your lab work was stable)   Your physician has requested that you have an echocardiogram. Echocardiography is a painless test that uses sound waves to create images of your heart. It provides your doctor with information about the size and shape of your heart and how well your heart's chambers and valves are working. This procedure takes approximately one hour. There are no restrictions for this procedure. (will be at [facility], they will call you)   Your physician recommends that you schedule a follow-up appointment in: 6 months with Dr. Shirlee Latch  ( we will call you)

## 2017-03-21 NOTE — Progress Notes (Signed)
Patient ID: Derrick FellerBobby Todd, male   DOB: 03/29/1961, 56 y.o.   MRN: [id number] PCP: Dr. Tanya NonesPickard Cardiology: Dr. Shirlee LatchMcLean  56 y.o. with history of CAD s/p NSTEMI in 5/13 and inferior STEMI in 12/14 presents for followup.  In 5/13, he had an NSTEMI with BMS to totally occluded distal RCA.  In 12/14, he had an inferior STEMI with 95% mid RCA (distal RCA stent patent) [facility].  [facility].  Echo (12/14) showed EF 50-55%.  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  He quit smoking in 7/14.  Echo (6/15) showed EF 55-60% with basal to mid inferolateral hypokinesis (no significant change from prior).  He was admitted with unstable angina in 12/17, had DES to mid LAD 90% stenosis. Peripheral arterial dopplers in 8/18 showed 75-99% mid left femoral artery stenosis.   He currently is feeling well.  Still working full time in the [employer].  He has dyspnea after taking Brilinta but this resolves with a cup of coffee.  No exertional dyspnea or chest pain currently, shoveled snow last week without any problems.  He does get cramps in his legs and hands from time to time, not one leg more than the other and not really related to exertion.  No definite claudication.  He is only taking Crestor three days a week as he feels "foggy" when he takes it every day. He has not been using Lasix.   ECG: NSR, normal.    Labs (5/13): LDL 122, HDL 32 Labs (6/13): K 3.9, creatinine 1.0 Labs (12/14): K 3.8, creatinine 1.0, LDL 153 Labs (3/15): LDL 88, HDL 43, hgbA1c 9.2 Labs (6/15): K 4, creatinine 1.0 Labs (9/17): LDL 212, hgb 14.2 Labs (9/18): LDL 60, HDL 44, K 3.9, creatinine 2.441.34, hgb 14.1  PMH: 1. CAD: NSTEMI 5/13 with LHC showing 70% OM, 70% mLAD, total occlusion distal RCA, EF 55%.  Had 2 x 18 BMS to distal RCA.  Echo (5/13): EF 55-60% with basal inferior hypokinesis.  Inferior STEMI (12/14).  LHC showed 60% mLAD, 60-70% dLCx, 95% mRCA, patent dRCA stent.  [facility]  [facility]  Echo (12/14) with EF 50-55% [facility]  Echo (6/15) with EF 55-60%, basal to mid inferolateral hypokinesis.  - Unstable angina 12/17 with LHC showing 90% mLAD stenosis treated with DES and 80% distal LCx stenosis (small vessel, medical management).  EF 50-55%.  2. Hyperlipidemia: myalgias with atorvastatin. Myalgias with Crestor but more manageable. Feels "foggy" when he takes Crestor daily.  3. Type II diabetes 4. Erectile dysfunction 5. Low testosterone 6. PAD: Peripheral arterial dopplers (8/18) with 75-99% mid left femoral artery stenosis.   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  2 daughters.  Quit smoking in 7/14.   FH: CAD  ROS: All systems reviewed and negative except as per HPI.   Current Outpatient Medications  Medication Sig Dispense Refill  . ASPIR-LOW 81 MG EC tablet TAKE 1 TABLET (81 MG TOTAL) BY MOUTH DAILY. 30 tablet 1  . Coenzyme Q10 200 MG capsule Take 1 capsule (200 mg total) by mouth daily.    . Empagliflozin-Linagliptin (GLYXAMBI) 25-5 MG TABS Take 1 tablet by mouth daily. 90 tablet 3  . furosemide (LASIX) 20 MG tablet Take 20 mg by mouth daily as needed for fluid or edema.    Marland Kitchen. glipiZIDE (GLUCOTROL XL) 10 MG 24 hr tablet Take  1 tablet (10 mg total) by mouth daily with breakfast. 90 tablet 3  . hydrOXYzine (VISTARIL) 25 MG capsule Take 25 mg by mouth at bedtime as needed (sleep).    Boris Lown Oil 500 MG CAPS Take 500 mg by mouth daily.    Marland Kitchen lisinopril (PRINIVIL,ZESTRIL) 5 MG tablet TAKE 1 TABLET (5 MG TOTAL) BY MOUTH DAILY. 90 tablet 3  . magnesium oxide (MAG-OX) 400 MG tablet Take 400 mg by mouth daily.    . metFORMIN (GLUCOPHAGE) 1000 MG tablet Take 1 tablet (1,000 mg total) by mouth 2 (two) times daily with a meal. 180 tablet 3  . metoprolol tartrate (LOPRESSOR) 50 MG tablet TAKE 1 TABLET (50 MG TOTAL) BY MOUTH 2 (TWO) TIMES DAILY. 180 tablet 3  . naproxen sodium (ANAPROX) 220 MG tablet Take 440 mg by mouth  2 (two) times daily with a meal.    . nitroGLYCERIN (NITROSTAT) 0.4 MG SL tablet Place 1 tablet (0.4 mg total) under the tongue every 5 (five) minutes x 3 doses as needed for chest pain. 25 tablet 4  . Omega-3 Fatty Acids (FISH OIL) 1000 MG CAPS Take 1,000 mg by mouth daily.     . pantoprazole (PROTONIX) 40 MG tablet Take 1 tablet (40 mg total) by mouth daily. 90 tablet 3  . potassium chloride (K-DUR,KLOR-CON) 10 MEQ tablet Take 10 mEq by mouth daily as needed (Take along with Lasix).    . rosuvastatin (CRESTOR) 40 MG tablet Take 40 mg by mouth 3 (three) times a week.    . sildenafil (VIAGRA) 50 MG tablet Take 1 tablet (50 mg total) by mouth daily as needed for erectile dysfunction. DO NOT TAKE NITROGLYCERIN WITHIN 24 HOURS OF TAKING VIAGRA 15 tablet 1  . Testosterone (ANDROGEL PUMP) 20.25 MG/ACT (1.62%) GEL Apply 1 pump daily as directed 75 g 0  . ticagrelor (BRILINTA) 90 MG TABS tablet Take 1 tablet (90 mg total) by mouth 2 (two) times daily. 60 tablet 11  . Vitamin D, Cholecalciferol, 400 units TABS Take 400 Units by mouth daily.      No current facility-administered medications for this encounter.     BP 118/76   Pulse 75   Wt 230 lb (104.3 kg)   SpO2 96%   BMI 29.53 kg/m  General: NAD Neck: No JVD, no thyromegaly or thyroid nodule.  Lungs: Clear to auscultation bilaterally with normal respiratory effort. CV: Nondisplaced PMI.  Heart regular S1/S2, no S3/S4, no murmur.  No peripheral edema.  No carotid bruit.  Unable to palpate PT pulses.   Abdomen: Soft, nontender, no hepatosplenomegaly, no distention.  Skin: Intact without lesions or rashes.  Neurologic: Alert and oriented x 3.  Psych: Normal affect. Extremities: No clubbing or cyanosis.  HEENT: Normal.     Assessment/Plan: 1. CAD: Most recently had unstable angina in 12/17 with DES to mid LAD, had medically managed 80% distal LCx stenosis (small vessel).    - Continue ASA 81.  - He is on Brilinta currently, will continue  this until the end of this year.  At that time, will transition to Xarelto 2.5 mg bid (COMPASS regimen) given extensive vascular disease.  - Continue statin, unable to tolerate Crestor more than every other day.  - I am going to arrange for an echocardiogram. At least mildly decreased systolic function on LV-gram in 12/17.  2. Hyperlipidemia:  Cannot tolerate Crestor more than every other day. I will check lipids today, if LDL > 70, would recommend that he be  seen in lipid clinic and start Repatha.  3. PAD: 75-99% mid left femoral artery stenosis on peripheral arterial dopplers from 8/18.  No definite claudication and no pedal ulcerations. Medical management for now.   Followup in 6 months.   Marca Ancona 03/21/2017

## 2017-03-22 ENCOUNTER — Encounter (HOSPITAL_COMMUNITY): Payer: Self-pay

## 2017-03-22 ENCOUNTER — Telehealth (HOSPITAL_COMMUNITY): Payer: Self-pay | Admitting: Cardiology

## 2017-03-23 ENCOUNTER — Encounter: Payer: Self-pay | Admitting: Family Medicine

## 2017-03-25 ENCOUNTER — Encounter: Payer: Self-pay | Admitting: Family Medicine

## 2017-03-29 NOTE — Telephone Encounter (Signed)
User: Trina Ao A Date/time: 03/22/17 11:02 AM  Comment: Called pt and lmsg for him to Cb to get scheduled for an echo.   Context:  Outcome: Left Message  Phone number: [phone] Phone Type: Mobile  Comm. type: Telephone Call type: Outgoing  Contact: [name] Relation to patient: Self

## 2017-03-30 ENCOUNTER — Ambulatory Visit (HOSPITAL_COMMUNITY): Payer: 59 | Attending: Cardiovascular Disease

## 2017-03-30 ENCOUNTER — Other Ambulatory Visit: Payer: Self-pay

## 2017-03-30 DIAGNOSIS — I5032 Chronic diastolic (congestive) heart failure: Secondary | ICD-10-CM

## 2017-03-30 DIAGNOSIS — I061 Rheumatic aortic insufficiency: Secondary | ICD-10-CM | POA: Insufficient documentation

## 2017-03-30 DIAGNOSIS — I420 Dilated cardiomyopathy: Secondary | ICD-10-CM | POA: Insufficient documentation

## 2017-05-31 ENCOUNTER — Other Ambulatory Visit: Payer: Self-pay | Admitting: Cardiology

## 2017-05-31 ENCOUNTER — Other Ambulatory Visit: Payer: Self-pay | Admitting: Family Medicine

## 2017-05-31 MED FILL — SILDENAFIL CITRATE 50 MG TA: 50 | 40 days supply | Qty: 8 | Fill #0

## 2017-05-31 MED FILL — metFORMIN HCL 1000 MG TABS: 1000 | 90 days supply | Qty: 180 | Fill #0

## 2017-05-31 MED FILL — glipiZIDE ER 10 MG TB24: 10 | 90 days supply | Qty: 90 | Fill #1

## 2017-05-31 MED FILL — PANTOPRAZOLE SOD DR 40 MG T: 40 | 90 days supply | Qty: 90 | Fill #1

## 2017-05-31 MED FILL — LISINOPRIL 5 MG TABLET: 5 | 90 days supply | Qty: 90 | Fill #1

## 2017-05-31 MED FILL — METOPROLOL TARTRATE 50 MG T: 50 | 90 days supply | Qty: 180 | Fill #1

## 2017-05-31 NOTE — Telephone Encounter (Signed)
LOV - 12/23/16    Please send to pharm.

## 2017-06-01 ENCOUNTER — Other Ambulatory Visit (HOSPITAL_COMMUNITY): Payer: Self-pay | Admitting: *Deleted

## 2017-06-01 ENCOUNTER — Encounter: Payer: Self-pay | Admitting: Family Medicine

## 2017-06-01 MED ORDER — RIVAROXABAN 2.5 MG PO TABS: 2.5000 mg | ORAL_TABLET | Freq: Two times a day (BID) | ORAL | 3 refills | Status: DC

## 2017-06-01 MED FILL — TESTOSTERONE 20.25 MG/ACT (: 20.25 MG/AC | 60 days supply | Qty: 75 | Fill #0

## 2017-06-01 MED FILL — XARELTO 2.5 MG TABS: 2.5 | 30 days supply | Qty: 60 | Fill #0

## 2017-06-01 MED FILL — ROSUVASTATIN CALCIUM 40 MG: 40 | 45 days supply | Qty: 15 | Fill #0

## 2017-06-01 NOTE — Telephone Encounter (Signed)
Patient called stating that he had finished his Brilinta and was very angry that we had not called in his Xarelto to the pharmacy.  After reading Dr. Alford Highland note patient was suppose to Stop Brilinta and start Xarelto 2.5 mg Twice Daily.  Medication sent to pharmacy.

## 2017-06-02 ENCOUNTER — Other Ambulatory Visit (HOSPITAL_COMMUNITY): Payer: Self-pay | Admitting: *Deleted

## 2017-06-02 MED ORDER — ROSUVASTATIN CALCIUM 40 MG PO TABS: 40.0000 mg | ORAL_TABLET | Freq: Every day | ORAL | 11 refills | Status: DC

## 2017-06-02 NOTE — Telephone Encounter (Signed)
Patient called yesterday stating he has been taking Crestor 40 mg Daily instead of every other day.  Handling fine, per Dr. Shirlee Latch he said to continue taking daily.  Medication change updated.

## 2017-06-08 MED FILL — GLYXAMBI 25 MG-5 MG TABLET: 25-5 | 90 days supply | Qty: 90 | Fill #1

## 2017-06-23 ENCOUNTER — Ambulatory Visit (INDEPENDENT_AMBULATORY_CARE_PROVIDER_SITE_OTHER): Payer: No Typology Code available for payment source | Admitting: Family Medicine

## 2017-06-23 ENCOUNTER — Encounter: Payer: Self-pay | Admitting: Family Medicine

## 2017-06-23 VITALS — BP 110/70 | HR 80 | Temp 97.8°F | Resp 16 | Ht 74.0 in | Wt 235.0 lb

## 2017-06-23 DIAGNOSIS — Z23 Encounter for immunization: Secondary | ICD-10-CM

## 2017-06-23 DIAGNOSIS — I2510 Atherosclerotic heart disease of native coronary artery without angina pectoris: Secondary | ICD-10-CM | POA: Diagnosis not present

## 2017-06-23 DIAGNOSIS — Z114 Encounter for screening for human immunodeficiency virus [HIV]: Secondary | ICD-10-CM | POA: Diagnosis not present

## 2017-06-23 DIAGNOSIS — Z125 Encounter for screening for malignant neoplasm of prostate: Secondary | ICD-10-CM | POA: Diagnosis not present

## 2017-06-23 DIAGNOSIS — E119 Type 2 diabetes mellitus without complications: Secondary | ICD-10-CM | POA: Diagnosis not present

## 2017-06-23 DIAGNOSIS — Z1159 Encounter for screening for other viral diseases: Secondary | ICD-10-CM | POA: Diagnosis not present

## 2017-06-23 DIAGNOSIS — E7800 Pure hypercholesterolemia, unspecified: Secondary | ICD-10-CM

## 2017-06-23 DIAGNOSIS — E291 Testicular hypofunction: Secondary | ICD-10-CM | POA: Diagnosis not present

## 2017-06-23 NOTE — Addendum Note (Signed)
Addended by: Legrand Rams B on: 06/23/2017 11:34 AM   Modules accepted: Orders

## 2017-06-23 NOTE — Progress Notes (Signed)
Subjective:    Patient ID: Derrick Todd, male    DOB: [birth year], 57 y.o.   MRN: [id number]  HPI Was admitted to hospital.  I have copied relevant portions of the discharge summary and included them below for my reference:  Admit date: 03/30/2016 Discharge date: 03/30/2016  Discharge Diagnoses    Active Problems:   Abnormal nuclear cardiac imaging test CAD Hyperlipidemia Type II Diabetes  Diagnostic Studies/Procedures    Procedures  03/30/2016 Coronary Stent Intervention  Left Heart Cath and Coronary Angiography  Conclusion   1. Severe LAD stenosis, treated successfully with PCI using a long drug-eluting stent platform 2. Continued patency of the stented segments in the right coronary artery 3. Severe stenosis of the distal left circumflex very small area of myocardium, appropriate for medical therapy 4. Normal LV systolic function  Recommendations: Continue dual antiplatelet therapy with aspirin and brilinta for 12 months as tolerated.. Aggressive medical therapy.   Note: The patient's LAD wraps the apex of the heart and likely accounts for both the anterior and infero-apical ischemia seen on his nuclear scan  Note: Avoid right radial approach if future cath is required    _____________   History of Present Illness     57 yo male with past medical history of CAD s/p NSTEMI in 08/2011 with BMS to totally occluded RCA, inferior STEMI in 03/2013 with BMS to 95% mid RCA lesion at [facility], HLD, and DM. He presents for  Echo (12/14) showed EF 50-55%. He has developed exertional chest pain relieved by SL Nitroglycerin.  [name] was done on 03/23/2016 showing 2 areas of ischemia in the RCA and mid LAD territories and suspicion for multivessel disease. LVEF was mildly decreased to 44%. He was scheduled for an outpatient cardiac cath.  Hospital Course     Consultants: None  Patient presented for outpatient cardiac cath with findings of  severe LAD stenosis, treated successfully with PCI using long DES, continued patency of RCA stents, severe stenosis of distal left circumflex feeding very small area of myocardium that is appropriate for medical therapy and normal LV systolic function.   The patient tolerated the procedure well. No immediate complications. Radial and r groin cath site stable.   Patient has been seen by Dr. Excell Seltzer today and deemed ready for discharge home. All follow up appointments have been scheduled. Discharge medications are listed below.  Plavix is being changed to Brilinta. Arrangements made for Brilinta to be delivered to patient prior to discharge through the [facility] program via [facility] pharmacy. _____________ 06/14/16 Here today for regular follow up. Patient feels much better since when I last saw him. However he states that he was feeling better even prior to receiving the 2  stents. He truly believes that the testosterone was helping with his fatigue. He is compliant with his aspirin and brilinta.  He also states that he's taking his Jardiance laterally. We had a long discussion today regarding decreasing the risk of cardiovascular death and this medicine would certainly decrease his odds of future complications. His blood pressure today is well controlled. He denies any myalgias or right upper quadrant pain on his statin. He is checking his blood sugar infrequently but he typically states that his blood sugars less than 150. He denies any hypoglycemia. He denies any polyuria, polydipsia, or blurred vision. Diabetic foot exam is performed today and is significant for week dorsalis pedis pulses in his left foot. At that time, my plan was:  Patient is appropriately anticoagulated. His blood pressure is excellent. I'll check a fasting lipid panel. Goal LDL cholesterol is less than 70. I will check his hemoglobin A1c. Goal hemoglobin A1c is less than 7. Patient feels much better since taking  testosterone. I will check his testosterone level today to ensure that we are not achieving patient is here today for follow-up supratherapeutic levels putting him at high risk for cardiovascular disease. I'll also monitor for prostate cancer with a PSA. I will check a urine microalbumin. I recommended a diabetic eye exam. Diabetic foot exam is significant for weak dorsalis pedis pulses in left foot. Given his vascular history, I will schedule the patient for arterial Dopplers with ABIs to evaluate for peripheral artery disease  12/23/16 The patient is here today for follow-up.  Since his last visit, arterial Dopplers were obtained of both legs and did reveal a mildly depressed ABI in his left leg but no significant blockage was seen. He denies any claudication and therefore my plan is to manage this medically. Recently had lab work. Hemoglobin A1c is 6.1. He does have occasions of hypoglycemia when he misses meals. He is truly taking glipizide 10 mg 3 times a day. I suggested that we switch this to an extended release 10 mg once a day to prevent hypoglycemia. His LDL cholesterol is 60 and well controlled. He denies any myalgias or right upper quadrant pain. His blood pressure today is outstanding. He denies any chest pain shortness of breath or dyspnea on exertion. He is due for a flu shot. However his testosterone level remains low. For some reason, the patient is using topical testosterone every other day and is not applying the gel once a day as directed.  At that time, my plan was: Blood pressure is outstanding. I will make no changes in his medication. His LDL cholesterol is below his goal of 70. His hemoglobin A1c is outstanding. Decrease glipizide to glipizide extended release 10 mg once a day and recheck in 6 months. He does have signs of peripheral vascular disease but at the present time is asymptomatic. Therefore we will focus on medical management by controlling his blood pressure cholesterol and  blood sugar. He received his flu shot today. I did recommend that he increase his testosterone frequency to daily. Reassess in 6 months or as needed.  06/23/17 Patient is here today for follow-up.  He denies any chest pain shortness of breath or dyspnea on exertion.  He denies any peripheral claudication in his legs with ambulation despite his known peripheral vascular disease.  He denies any polyuria polydipsia or blurry vision.  He denies any myalgias or right upper quadrant pain.  We again discussed the risk of testosterone replacement and he again accepts the increased risk of cardiovascular disease due to the benefit that he receives from the testosterone.  He states that it gives him more energy and helps his fatigue however he is only using it every other day.  He is due to recheck his hemoglobin A1c today along with his fasting lipid panel.  He is also due for prostate cancer screening.  He is due for Pneumovax 23.  He is due for diabetic eye exam. Past Medical History:  Diagnosis Date  . Abnormal nuclear cardiac imaging test 03/30/2016  . CAD (coronary artery disease)    a. inferior STEMI 5/13:  LHC prox-mid LAD 20-30%, mid LAD 70%, pOM 70%, pRCA 20%, mid 40%, AM Br 50-60%, dRCA 70% then 100%.  EF 55%.  PCI:  BMS to the distal RCA.;  b. inf STEMI (03/2013 at [facility]): LHC -  mLAD 60%, dCFX 70-80, mRCA 95, dRCA stent ok.  PCI:  Vision (2.5 x 15 mm) BMS to the mRCA. C. 03/30/16 DES to LAD, prior stents patent, nl LVF  . DM2 (diabetes mellitus, type 2) (HCC)   . Dyslipidemia   . GERD (gastroesophageal reflux disease)   . Hx of echocardiogram    a. 2-D echocardiogram 09/02/11: Mild LVH, EF 55-60%, basal inferior HK, mild LAE, PASP 32.;   b. Echo (03/20/2013):  Mild TR, EF 50-55%, mild LVH.  Marland Kitchen Hyperlipidemia   . Myocardial infarction (HCC)    2014 AND 2015  . Tobacco abuse    Past Surgical History:  Procedure Laterality Date  . CARDIAC CATHETERIZATION [name] 03/30/2016   Procedure: Left  Heart Cath and Coronary Angiography;  Surgeon: [name], MD;  Location: [facility] INVASIVE CV LAB;  Service: Cardiovascular;  Laterality: [name];  . CARDIAC CATHETERIZATION [name] 03/30/2016   Procedure: Coronary Stent Intervention;  Surgeon: [name], MD;  Location: [facility] INVASIVE CV LAB;  Service: Cardiovascular;  Laterality: [name];  . LEFT HEART CATHETERIZATION WITH CORONARY ANGIOGRAM [name] 09/02/2011   Procedure: LEFT HEART CATHETERIZATION WITH CORONARY ANGIOGRAM;  Surgeon: Herby Abraham, MD;  Location: [facility] CATH LAB;  Service: Cardiovascular;  Laterality: [name];  . PERCUTANEOUS CORONARY STENT INTERVENTION (PCI-S) Right 09/02/2011   Procedure: PERCUTANEOUS CORONARY STENT INTERVENTION (PCI-S);  Surgeon: Herby Abraham, MD;  Location: [facility] CATH LAB;  Service: Cardiovascular;  Laterality: Right;  . TUMOR REMOVAL  2012   . Current Outpatient Medications on File Prior to Visit  Medication Sig Dispense Refill  . ANDROGEL PUMP 20.25 MG/ACT (1.62%) GEL APPLY 1 PUMP DAILY AS DIRECTED 75 g 5  . ASPIR-LOW 81 MG EC tablet TAKE 1 TABLET (81 MG TOTAL) BY MOUTH DAILY. 30 tablet 1  . Coenzyme Q10 200 MG capsule Take 1 capsule (200 mg total) by mouth daily.    . Empagliflozin-Linagliptin (GLYXAMBI) 25-5 MG TABS Take 1 tablet by mouth daily. 90 tablet 3  . furosemide (LASIX) 20 MG tablet Take 20 mg by mouth daily as needed for fluid or edema.    Marland Kitchen glipiZIDE (GLUCOTROL XL) 10 MG 24 hr tablet Take 1 tablet (10 mg total) by mouth daily with breakfast. 90 tablet 3  . hydrOXYzine (VISTARIL) 25 MG capsule Take 25 mg by mouth at bedtime as needed (sleep).    Boris Lown Oil 500 MG CAPS Take 500 mg by mouth daily.    Marland Kitchen lisinopril (PRINIVIL,ZESTRIL) 5 MG tablet TAKE 1 TABLET (5 MG TOTAL) BY MOUTH DAILY. 90 tablet 3  . magnesium oxide (MAG-OX) 400 MG tablet Take 400 mg by mouth daily.    . metFORMIN (GLUCOPHAGE) 1000 MG tablet Take 1 tablet (1,000 mg total) by mouth 2 (two) times daily with a meal. 180 tablet 3  . metoprolol  tartrate (LOPRESSOR) 50 MG tablet TAKE 1 TABLET (50 MG TOTAL) BY MOUTH 2 (TWO) TIMES DAILY. 180 tablet 3  . naproxen sodium (ANAPROX) 220 MG tablet Take 440 mg by mouth 2 (two) times daily with a meal.    . nitroGLYCERIN (NITROSTAT) 0.4 MG SL tablet Place 1 tablet (0.4 mg total) under the tongue every 5 (five) minutes x 3 doses as needed for chest pain. 25 tablet 4  . Omega-3 Fatty Acids (FISH OIL) 1000 MG CAPS Take 1,000 mg by mouth daily.     . pantoprazole (PROTONIX) 40 MG  tablet Take 1 tablet (40 mg total) by mouth daily. 90 tablet 3  . potassium chloride (K-DUR,KLOR-CON) 10 MEQ tablet Take 10 mEq by mouth daily as needed (Take along with Lasix).    . rivaroxaban (XARELTO) 2.5 MG TABS tablet Take 1 tablet (2.5 mg total) by mouth 2 (two) times daily. 60 tablet 3  . rosuvastatin (CRESTOR) 40 MG tablet Take 1 tablet (40 mg total) by mouth daily. 30 tablet 11  . sildenafil (VIAGRA) 50 MG tablet Take 1 tablet (50 mg total) by mouth daily as needed for erectile dysfunction. DO NOT TAKE NITROGLYCERIN WITHIN 24 HOURS OF TAKING VIAGRA 15 tablet 1  . Vitamin D, Cholecalciferol, 400 units TABS Take 400 Units by mouth daily.      No current facility-administered medications on file prior to visit.    Allergies  Allergen Reactions  . Livalo [Pitavastatin] Other (See Comments)    Myalgias - leg cramps   Social History   Socioeconomic History  . Marital status: Married    Spouse name: Not on file  . Number of children: Not on file  . Years of education: Not on file  . Highest education level: Not on file  Occupational History  . Not on file  Social Needs  . Financial resource strain: Not on file  . Food insecurity:    Worry: Not on file    Inability: Not on file  . Transportation needs:    Medical: Not on file    Non-medical: Not on file  Tobacco Use  . Smoking status: Former Smoker    Types: Cigarettes    Last attempt to quit: 04/05/2011    Years since quitting: 6.2  . Smokeless tobacco:  Never Used  . Tobacco comment: Quit in 2014but Vape.   Substance and Sexual Activity  . Alcohol use: Yes    Comment: Twice a week.   . Drug use: No  . Sexual activity: Yes    Birth control/protection: None  Lifestyle  . Physical activity:    Days per week: Not on file    Minutes per session: Not on file  . Stress: Not on file  Relationships  . Social connections:    Talks on phone: Not on file    Gets together: Not on file    Attends religious service: Not on file    Active member of club or organization: Not on file    Attends meetings of clubs or organizations: Not on file    Relationship status: Not on file  . Intimate partner violence:    Fear of current or ex partner: Not on file    Emotionally abused: Not on file    Physically abused: Not on file    Forced sexual activity: Not on file  Other Topics Concern  . Not on file  Social History Narrative  . Not on file      Review of Systems  All other systems reviewed and are negative.      Objective:   Physical Exam  Constitutional: He appears well-developed and well-nourished. No distress.  Eyes: Conjunctivae are normal. No scleral icterus.  Neck: Neck supple. No JVD present.  Cardiovascular: Normal rate, regular rhythm and normal heart sounds.  No murmur heard. Pulmonary/Chest: Effort normal and breath sounds normal. No respiratory distress. He has no wheezes. He has no rales.  Abdominal: Soft. Bowel sounds are normal. He exhibits no distension. There is no tenderness. There is no rebound and no guarding.  Musculoskeletal: He  exhibits no edema.  Lymphadenopathy:    He has no cervical adenopathy.  Skin: No rash noted. He is not diaphoretic.  Vitals reviewed.         Assessment & Plan:  ASCVD (arteriosclerotic cardiovascular disease)  Pure hypercholesterolemia  Controlled type 2 diabetes mellitus without complication, without long-term current use of insulin (HCC) - Plan: CBC with Differential/Platelet,  COMPLETE METABOLIC PANEL WITH GFR, Lipid panel, Microalbumin, urine, Hemoglobin A1c  Hypogonadism in male  Encounter for screening for HIV - Plan: HIV antibody  Encounter for hepatitis C screening test for low risk patient - Plan: Hepatitis C Antibody  Prostate cancer screening - Plan: PSA  Exam is normal.  Check HgA1c, microalbumen,.  Goal HgA1c is less than 7.  BP is well controlled. Check FLP, goal ldl is less than 70.  Patient received pneumovax 23.  Check psa for prostate cancer.  Screen for HIV and Hep C.

## 2017-06-25 LAB — HEMOGLOBIN A1C
EAG (MMOL/L): 8.2 (calc)
HEMOGLOBIN A1C: 6.8 %{Hb} — AB (ref <5.7)
MEAN PLASMA GLUCOSE: 148 (calc)

## 2017-06-25 LAB — LIPID PANEL
CHOL/HDL RATIO: 3.5 (calc) (ref <5.0)
CHOLESTEROL: 146 mg/dL (ref <200)
HDL: 42 mg/dL (ref >40)
LDL Cholesterol (Calc): 76 mg/dL (calc)
Non-HDL Cholesterol (Calc): 104 mg/dL (calc) (ref <130)
Triglycerides: 186 mg/dL — ABNORMAL HIGH (ref <150)

## 2017-06-25 LAB — COMPLETE METABOLIC PANEL WITH GFR
AG Ratio: 1.7 (calc) (ref 1.0–2.5)
ALKALINE PHOSPHATASE (APISO): 54 U/L (ref 40–115)
ALT: 19 U/L (ref 9–46)
AST: 18 U/L (ref 10–35)
Albumin: 4.3 g/dL (ref 3.6–5.1)
BILIRUBIN TOTAL: 0.7 mg/dL (ref 0.2–1.2)
BUN: 18 mg/dL (ref 7–25)
CHLORIDE: 106 mmol/L (ref 98–110)
CO2: 23 mmol/L (ref 20–32)
CREATININE: 1.23 mg/dL (ref 0.70–1.33)
Calcium: 9.4 mg/dL (ref 8.6–10.3)
GFR, Est African American: 75 mL/min/{1.73_m2} (ref >=60)
GFR, Est Non African American: 64 mL/min/{1.73_m2} (ref >=60)
GLOBULIN: 2.5 g/dL (ref 1.9–3.7)
Glucose, Bld: 80 mg/dL (ref 65–99)
Potassium: 4.4 mmol/L (ref 3.5–5.3)
SODIUM: 138 mmol/L (ref 135–146)
Total Protein: 6.8 g/dL (ref 6.1–8.1)

## 2017-06-25 LAB — MICROALBUMIN, URINE: Microalb, Ur: 1.9 mg/dL

## 2017-06-25 LAB — CBC WITH DIFFERENTIAL/PLATELET
BASOS ABS: 48 {cells}/uL (ref 0–200)
Basophils Relative: 0.9 %
EOS ABS: 329 {cells}/uL (ref 15–500)
Eosinophils Relative: 6.2 %
HEMATOCRIT: 39.6 % (ref 38.5–50.0)
Hemoglobin: 13 g/dL — ABNORMAL LOW (ref 13.2–17.1)
Lymphs Abs: 1193 cells/uL (ref 850–3900)
MCH: 24.3 pg — AB (ref 27.0–33.0)
MCHC: 32.8 g/dL (ref 32.0–36.0)
MCV: 73.9 fL — ABNORMAL LOW (ref 80.0–100.0)
MONOS PCT: 12.7 %
MPV: 9.3 fL (ref 7.5–12.5)
NEUTROS PCT: 57.7 %
Neutro Abs: 3058 cells/uL (ref 1500–7800)
Platelets: 268 10*3/uL (ref 140–400)
RBC: 5.36 10*6/uL (ref 4.20–5.80)
RDW: 15.6 % — ABNORMAL HIGH (ref 11.0–15.0)
Total Lymphocyte: 22.5 %
WBC mixed population: 673 cells/uL (ref 200–950)
WBC: 5.3 10*3/uL (ref 3.8–10.8)

## 2017-06-25 LAB — HEPATITIS C ANTIBODY
HEP C AB: NONREACTIVE
SIGNAL TO CUT-OFF: 0.05 (ref <1.00)

## 2017-06-25 LAB — HIV ANTIBODY (ROUTINE TESTING W REFLEX): HIV: NONREACTIVE

## 2017-06-25 LAB — PSA: PSA: 0.3 ng/mL (ref <=4.0)

## 2017-06-26 ENCOUNTER — Encounter: Payer: Self-pay | Admitting: Family Medicine

## 2017-07-20 MED FILL — SILDENAFIL CITRATE 50 MG TA: 50 | 40 days supply | Qty: 8 | Fill #1

## 2017-07-20 MED FILL — XARELTO 2.5 MG TABS: 2.5 | 30 days supply | Qty: 60 | Fill #1

## 2017-07-20 MED FILL — ROSUVASTATIN CALCIUM 40 MG: 40 | 30 days supply | Qty: 30 | Fill #0

## 2017-07-26 ENCOUNTER — Encounter: Payer: Self-pay | Admitting: Family Medicine

## 2017-07-27 ENCOUNTER — Encounter: Payer: Self-pay | Admitting: Family Medicine

## 2017-07-31 ENCOUNTER — Ambulatory Visit (INDEPENDENT_AMBULATORY_CARE_PROVIDER_SITE_OTHER): Payer: No Typology Code available for payment source | Admitting: Family Medicine

## 2017-07-31 ENCOUNTER — Encounter: Payer: Self-pay | Admitting: Family Medicine

## 2017-07-31 VITALS — BP 120/70 | HR 94 | Temp 98.0°F | Resp 16 | Ht 74.0 in | Wt 230.0 lb

## 2017-07-31 DIAGNOSIS — R5382 Chronic fatigue, unspecified: Secondary | ICD-10-CM

## 2017-07-31 DIAGNOSIS — M545 Low back pain, unspecified: Secondary | ICD-10-CM

## 2017-07-31 LAB — TSH: TSH: 2.19 m[IU]/L (ref 0.40–4.50)

## 2017-07-31 NOTE — Progress Notes (Signed)
Subjective:    Patient ID: Derrick Todd, male    DOB: 18-Nov-1960, 57 y.o.   MRN: [id number]  HPI Patient presents today for 2 separate issues.  #1 he presents today complaining of fatigue.  He states that he has no energy.  He has no drive.  He has no stamina.  He denies any chest pain shortness of breath or dyspnea on exertion.  In March of last year, his testosterone levels were over 800.  At that time he was using AndroGel 1.62% cream, 2 pumps every day.  At that time I recommended that he decrease to 1 pump a day to avoid or reduce chance of side effects.  However patient reduced to 1 pump every other day.  He now states that due to forgetting to take the medication, he frequently takes 1 pump every 2 to 3 days.  Testosterone level was last checked in September and was low at that time at 238.  Since that time, he has become even more infrequent and taking his testosterone.  Second issue is low back pain.  Patient was rollerskating on Saturday, April 20.  Patient slipped.  His legs went right out from underneath him.  He fell backwards and landed on his posterior left hip.  There is a large hematoma there now roughly the size of my hand just inferior and posterior to the greater trochanter.  He also has palpable muscle spasms in his left lumbar paraspinal muscles.  Patient went to work on April 22 however was unable to stand and work due to the pain.  He was then out of work on Tuesday the 23rd, Wednesday the 24th, and Thursday the 25th.  He is scheduled to go back to work tomorrow and would like a note to return to work.  Even though he is still having pain, he would like to return to work.  He denies any pain with range of motion in the hip.  He denies any pain with weightbearing.  He denies any numbness or tingling in his left leg. Past Medical History:  Diagnosis Date  . Abnormal nuclear cardiac imaging test 03/30/2016  . CAD (coronary artery disease)    a. inferior STEMI 5/13:  LHC prox-mid LAD  20-30%, mid LAD 70%, pOM 70%, pRCA 20%, mid 40%, AM Br 50-60%, dRCA 70% then 100%.  EF 55%.  PCI:  BMS to the distal RCA.;  b. inf STEMI (03/2013 at [facility]): LHC -  mLAD 60%, dCFX 70-80, mRCA 95, dRCA stent ok.  PCI:  Vision (2.5 x 15 mm) BMS to the mRCA. C. 03/30/16 DES to LAD, prior stents patent, nl LVF  . DM2 (diabetes mellitus, type 2) (HCC)   . Dyslipidemia   . GERD (gastroesophageal reflux disease)   . Hx of echocardiogram    a. 2-D echocardiogram 09/02/11: Mild LVH, EF 55-60%, basal inferior HK, mild LAE, PASP 32.;   b. Echo (03/20/2013):  Mild TR, EF 50-55%, mild LVH.  Marland Kitchen Hyperlipidemia   . Myocardial infarction (HCC)    2014 AND 2015  . Tobacco abuse    Past Surgical History:  Procedure Laterality Date  . CARDIAC CATHETERIZATION [name] 03/30/2016   Procedure: Left Heart Cath and Coronary Angiography;  Surgeon: [name], MD;  Location: [facility] INVASIVE CV LAB;  Service: Cardiovascular;  Laterality: [name];  . CARDIAC CATHETERIZATION [name] 03/30/2016   Procedure: Coronary Stent Intervention;  Surgeon: [name], MD;  Location: [facility] INVASIVE CV LAB;  Service: Cardiovascular;  Laterality:  [name];  . LEFT HEART CATHETERIZATION WITH CORONARY ANGIOGRAM [name] 09/02/2011   Procedure: LEFT HEART CATHETERIZATION WITH CORONARY ANGIOGRAM;  Surgeon: Herby Abraham, MD;  Location: [facility] CATH LAB;  Service: Cardiovascular;  Laterality: [name];  . PERCUTANEOUS CORONARY STENT INTERVENTION (PCI-S) Right 09/02/2011   Procedure: PERCUTANEOUS CORONARY STENT INTERVENTION (PCI-S);  Surgeon: Herby Abraham, MD;  Location: [facility] CATH LAB;  Service: Cardiovascular;  Laterality: Right;  . TUMOR REMOVAL  2012   Current Outpatient Medications on File Prior to Visit  Medication Sig Dispense Refill  . ANDROGEL PUMP 20.25 MG/ACT (1.62%) GEL APPLY 1 PUMP DAILY AS DIRECTED 75 g 5  . ASPIR-LOW 81 MG EC tablet TAKE 1 TABLET (81 MG TOTAL) BY MOUTH DAILY. 30 tablet 1  . Coenzyme Q10 200 MG capsule Take 1 capsule (200 mg total)  by mouth daily.    . Empagliflozin-Linagliptin (GLYXAMBI) 25-5 MG TABS Take 1 tablet by mouth daily. 90 tablet 3  . furosemide (LASIX) 20 MG tablet Take 20 mg by mouth daily as needed for fluid or edema.    Marland Kitchen glipiZIDE (GLUCOTROL XL) 10 MG 24 hr tablet Take 1 tablet (10 mg total) by mouth daily with breakfast. 90 tablet 3  . hydrOXYzine (VISTARIL) 25 MG capsule Take 25 mg by mouth at bedtime as needed (sleep).    Boris Lown Oil 500 MG CAPS Take 500 mg by mouth daily.    Marland Kitchen lisinopril (PRINIVIL,ZESTRIL) 5 MG tablet TAKE 1 TABLET (5 MG TOTAL) BY MOUTH DAILY. 90 tablet 3  . magnesium oxide (MAG-OX) 400 MG tablet Take 400 mg by mouth daily.    . metFORMIN (GLUCOPHAGE) 1000 MG tablet Take 1 tablet (1,000 mg total) by mouth 2 (two) times daily with a meal. 180 tablet 3  . metoprolol tartrate (LOPRESSOR) 50 MG tablet TAKE 1 TABLET (50 MG TOTAL) BY MOUTH 2 (TWO) TIMES DAILY. 180 tablet 3  . naproxen sodium (ANAPROX) 220 MG tablet Take 440 mg by mouth 2 (two) times daily with a meal.    . nitroGLYCERIN (NITROSTAT) 0.4 MG SL tablet Place 1 tablet (0.4 mg total) under the tongue every 5 (five) minutes x 3 doses as needed for chest pain. 25 tablet 4  . Omega-3 Fatty Acids (FISH OIL) 1000 MG CAPS Take 1,000 mg by mouth daily.     . pantoprazole (PROTONIX) 40 MG tablet Take 1 tablet (40 mg total) by mouth daily. 90 tablet 3  . potassium chloride (K-DUR,KLOR-CON) 10 MEQ tablet Take 10 mEq by mouth daily as needed (Take along with Lasix).    . rivaroxaban (XARELTO) 2.5 MG TABS tablet Take 1 tablet (2.5 mg total) by mouth 2 (two) times daily. 60 tablet 3  . rosuvastatin (CRESTOR) 40 MG tablet Take 1 tablet (40 mg total) by mouth daily. 30 tablet 11  . sildenafil (VIAGRA) 50 MG tablet Take 1 tablet (50 mg total) by mouth daily as needed for erectile dysfunction. DO NOT TAKE NITROGLYCERIN WITHIN 24 HOURS OF TAKING VIAGRA 15 tablet 1  . Vitamin D, Cholecalciferol, 400 units TABS Take 400 Units by mouth daily.      No  current facility-administered medications on file prior to visit.    Allergies  Allergen Reactions  . Livalo [Pitavastatin] Other (See Comments)    Myalgias - leg cramps   Social History   Socioeconomic History  . Marital status: Married    Spouse name: Not on file  . Number of children: Not on file  . Years of education: Not  on file  . Highest education level: Not on file  Occupational History  . Not on file  Social Needs  . Financial resource strain: Not on file  . Food insecurity:    Worry: Not on file    Inability: Not on file  . Transportation needs:    Medical: Not on file    Non-medical: Not on file  Tobacco Use  . Smoking status: Former Smoker    Types: Cigarettes    Last attempt to quit: 04/05/2011    Years since quitting: 6.3  . Smokeless tobacco: Never Used  . Tobacco comment: Quit in 2014but Vape.   Substance and Sexual Activity  . Alcohol use: Yes    Comment: Twice a week.   . Drug use: No  . Sexual activity: Yes    Birth control/protection: None  Lifestyle  . Physical activity:    Days per week: Not on file    Minutes per session: Not on file  . Stress: Not on file  Relationships  . Social connections:    Talks on phone: Not on file    Gets together: Not on file    Attends religious service: Not on file    Active member of club or organization: Not on file    Attends meetings of clubs or organizations: Not on file    Relationship status: Not on file  . Intimate partner violence:    Fear of current or ex partner: Not on file    Emotionally abused: Not on file    Physically abused: Not on file    Forced sexual activity: Not on file  Other Topics Concern  . Not on file  Social History Narrative  . Not on file      Review of Systems  All other systems reviewed and are negative.      Objective:   Physical Exam  Constitutional: He appears well-developed and well-nourished. No distress.  Cardiovascular: Normal rate, regular rhythm and  normal heart sounds.  Pulmonary/Chest: Effort normal and breath sounds normal.  Musculoskeletal:       Left hip: He exhibits swelling. He exhibits normal range of motion, normal strength, no tenderness and no bony tenderness.       Lumbar back: He exhibits decreased range of motion, tenderness, pain and spasm. He exhibits no bony tenderness.       Back:       Legs: Skin: He is not diaphoretic.  Vitals reviewed.         Assessment & Plan:  Chronic fatigue - Plan: Testosterone Total,Free,Bio, Males, Vitamin B12, TSH  Acute left-sided low back pain without sciatica  During the fall, the patient strained a muscle in his left lumbar paraspinal muscles.  He also suffered a hematoma to his left hip.  He is now cleared medically to return to work.  I believe his fatigue is likely due to hypogonadism and low testosterone levels.  I will repeat a testosterone level and plan tentatively to resume him on AndroGel 1.60% cream, 1 pump daily.

## 2017-08-01 ENCOUNTER — Encounter (INDEPENDENT_AMBULATORY_CARE_PROVIDER_SITE_OTHER): Payer: Self-pay

## 2017-08-01 ENCOUNTER — Encounter: Payer: Self-pay | Admitting: Family Medicine

## 2017-08-01 LAB — VITAMIN B12: VITAMIN B 12: 511 pg/mL (ref 200–1100)

## 2017-08-02 LAB — TESTOSTERONE TOTAL,FREE,BIO, MALES
Albumin: 4.7 g/dL (ref 3.6–5.1)
Sex Hormone Binding: 20 nmol/L — ABNORMAL LOW (ref 22–77)
TESTOSTERONE: 213 ng/dL — AB (ref 250–827)

## 2017-08-03 ENCOUNTER — Encounter: Payer: Self-pay | Admitting: Family Medicine

## 2017-08-03 ENCOUNTER — Encounter (INDEPENDENT_AMBULATORY_CARE_PROVIDER_SITE_OTHER): Payer: Self-pay

## 2017-08-04 ENCOUNTER — Telehealth: Payer: Self-pay | Admitting: Family Medicine

## 2017-08-04 ENCOUNTER — Ambulatory Visit: Payer: No Typology Code available for payment source | Admitting: Family Medicine

## 2017-08-04 NOTE — Telephone Encounter (Signed)
Received a fax for fmla for this patient  Will route this to sandy

## 2017-08-07 ENCOUNTER — Encounter: Payer: Self-pay | Admitting: Family Medicine

## 2017-08-08 NOTE — Telephone Encounter (Signed)
Filled out my part and routed to Dr. Pickard  

## 2017-08-11 ENCOUNTER — Telehealth: Payer: Self-pay | Admitting: Family Medicine

## 2017-08-11 NOTE — Telephone Encounter (Signed)
FMLA completed and faxed to [employer] 

## 2017-08-11 NOTE — Telephone Encounter (Signed)
FMLA completed and faxed to [employer]

## 2017-08-11 NOTE — Telephone Encounter (Signed)
FMLA dropped off, placed in yellow folder.

## 2017-08-15 ENCOUNTER — Telehealth: Payer: Self-pay | Admitting: Family Medicine

## 2017-08-15 NOTE — Telephone Encounter (Signed)
LMTRC

## 2017-08-15 NOTE — Telephone Encounter (Signed)
Patient calling with question regarding a phone call he got about his fmla  Please call him back at [phone]

## 2017-08-17 NOTE — Telephone Encounter (Signed)
Called pt and lmovm stating that we did receive further paperwork for FMLA that we needed to fill out and as soon as Dr. Tanya Nones gets that done we will fax it to [employer] that if he had further question to please call us back.

## 2017-08-21 MED FILL — XARELTO 2.5 MG TABS: 2.5 | 30 days supply | Qty: 60 | Fill #2

## 2017-08-21 MED FILL — ROSUVASTATIN CALCIUM 40 MG: 40 | 30 days supply | Qty: 30 | Fill #1

## 2017-08-21 MED FILL — LISINOPRIL 5 MG TABLET: 5 | 90 days supply | Qty: 90 | Fill #2

## 2017-08-21 MED FILL — glipiZIDE ER 10 MG TB24: 10 | 90 days supply | Qty: 90 | Fill #2

## 2017-08-21 MED FILL — GLYXAMBI 25 MG-5 MG TABLET: 25-5 | 90 days supply | Qty: 90 | Fill #2

## 2017-08-21 MED FILL — PANTOPRAZOLE SOD DR 40 MG T: 40 | 90 days supply | Qty: 90 | Fill #2

## 2017-08-21 MED FILL — metFORMIN HCL 1000 MG TABS: 1000 | 90 days supply | Qty: 180 | Fill #1

## 2017-08-21 MED FILL — METOPROLOL TARTRATE 50 MG T: 50 | 90 days supply | Qty: 180 | Fill #2

## 2017-08-25 ENCOUNTER — Encounter: Payer: Self-pay | Admitting: Family Medicine

## 2017-09-19 ENCOUNTER — Encounter: Payer: Self-pay | Admitting: Family Medicine

## 2017-09-19 ENCOUNTER — Ambulatory Visit (INDEPENDENT_AMBULATORY_CARE_PROVIDER_SITE_OTHER): Payer: No Typology Code available for payment source | Admitting: Family Medicine

## 2017-09-19 ENCOUNTER — Other Ambulatory Visit: Payer: Self-pay

## 2017-09-19 VITALS — BP 118/64 | HR 75 | Temp 97.7°F | Resp 16 | Ht 74.0 in | Wt 233.0 lb

## 2017-09-19 DIAGNOSIS — M5442 Lumbago with sciatica, left side: Secondary | ICD-10-CM | POA: Diagnosis not present

## 2017-09-19 DIAGNOSIS — Z0000 Encounter for general adult medical examination without abnormal findings: Secondary | ICD-10-CM

## 2017-09-19 MED ORDER — CYCLOBENZAPRINE HCL 10 MG PO TABS: 10.0000 mg | ORAL_TABLET | Freq: Three times a day (TID) | ORAL | 0 refills | Status: DC | PRN

## 2017-09-19 MED FILL — CYCLOBENZAPRINE 10 MG TAB: 10 | 10 days supply | Qty: 30 | Fill #0
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
## 2017-09-19 NOTE — Patient Instructions (Signed)
Continue your healthy diet and aerobic exercise 30 minutes 5x a week.  Can use muscle relaxer as needed, do not drive or work with it.   Please let us know if you would like physical therapy.  Return in 3 months for recheck of diabetes, cholesterol, etc.

## 2017-09-21 MED FILL — TESTOSTERONE 20.25 MG/ACT (: 20.25 MG/AC | 60 days supply | Qty: 75 | Fill #1

## 2017-10-16 ENCOUNTER — Encounter (HOSPITAL_COMMUNITY): Payer: Self-pay | Admitting: Cardiology

## 2017-10-16 ENCOUNTER — Ambulatory Visit (HOSPITAL_COMMUNITY)
Admission: RE | Admit: 2017-10-16 | Discharge: 2017-10-16 | Disposition: A | Discharge status: Home / Self Care | Payer: No Typology Code available for payment source | Source: Ambulatory Visit | Attending: Cardiology | Admitting: Cardiology

## 2017-10-16 VITALS — BP 126/84 | HR 87 | Wt 229.4 lb

## 2017-10-16 DIAGNOSIS — Z8249 Family history of ischemic heart disease and other diseases of the circulatory system: Secondary | ICD-10-CM | POA: Diagnosis not present

## 2017-10-16 DIAGNOSIS — I739 Peripheral vascular disease, unspecified: Secondary | ICD-10-CM

## 2017-10-16 DIAGNOSIS — Z7984 Long term (current) use of oral hypoglycemic drugs: Secondary | ICD-10-CM | POA: Diagnosis not present

## 2017-10-16 DIAGNOSIS — E1151 Type 2 diabetes mellitus with diabetic peripheral angiopathy without gangrene: Secondary | ICD-10-CM | POA: Diagnosis not present

## 2017-10-16 DIAGNOSIS — Z87891 Personal history of nicotine dependence: Secondary | ICD-10-CM | POA: Diagnosis not present

## 2017-10-16 DIAGNOSIS — I2510 Atherosclerotic heart disease of native coronary artery without angina pectoris: Secondary | ICD-10-CM | POA: Diagnosis present

## 2017-10-16 DIAGNOSIS — Z955 Presence of coronary angioplasty implant and graft: Secondary | ICD-10-CM | POA: Insufficient documentation

## 2017-10-16 DIAGNOSIS — E785 Hyperlipidemia, unspecified: Secondary | ICD-10-CM | POA: Diagnosis not present

## 2017-10-16 DIAGNOSIS — I252 Old myocardial infarction: Secondary | ICD-10-CM | POA: Diagnosis present

## 2017-10-16 DIAGNOSIS — Z79899 Other long term (current) drug therapy: Secondary | ICD-10-CM | POA: Insufficient documentation

## 2017-10-16 DIAGNOSIS — I5032 Chronic diastolic (congestive) heart failure: Secondary | ICD-10-CM

## 2017-10-16 DIAGNOSIS — M791 Myalgia, unspecified site: Secondary | ICD-10-CM | POA: Insufficient documentation

## 2017-10-16 DIAGNOSIS — Z7901 Long term (current) use of anticoagulants: Secondary | ICD-10-CM | POA: Insufficient documentation

## 2017-10-16 LAB — HEPATIC FUNCTION PANEL
ALK PHOS: 50 U/L (ref 38–126)
ALT: 28 U/L (ref 0–44)
AST: 25 U/L (ref 15–41)
Albumin: 4 g/dL (ref 3.5–5.0)
BILIRUBIN DIRECT: 0.2 mg/dL (ref 0.0–0.2)
BILIRUBIN INDIRECT: 0.9 mg/dL (ref 0.3–0.9)
BILIRUBIN TOTAL: 1.1 mg/dL (ref 0.3–1.2)
Total Protein: 7 g/dL (ref 6.5–8.1)

## 2017-10-16 LAB — BASIC METABOLIC PANEL
Anion gap: 8 (ref 5–15)
BUN: 16 mg/dL (ref 6–20)
CO2: 24 mmol/L (ref 22–32)
CREATININE: 1.06 mg/dL (ref 0.61–1.24)
Calcium: 9.2 mg/dL (ref 8.9–10.3)
Chloride: 108 mmol/L (ref 98–111)
GFR calc Af Amer: >60 mL/min (ref >60)
GLUCOSE: 103 mg/dL — AB (ref 70–99)
POTASSIUM: 4 mmol/L (ref 3.5–5.1)
Sodium: 140 mmol/L (ref 135–145)

## 2017-10-16 LAB — LIPID PANEL
Cholesterol: 149 mg/dL (ref 0–200)
HDL: 55 mg/dL (ref >40)
LDL CALC: 76 mg/dL (ref 0–99)
Total CHOL/HDL Ratio: 2.7 RATIO
Triglycerides: 91 mg/dL (ref <150)
VLDL: 18 mg/dL (ref 0–40)

## 2017-10-16 MED ORDER — METOPROLOL SUCCINATE ER 25 MG PO TB24: 75.0000 mg | ORAL_TABLET | Freq: Every day | ORAL | 2 refills | Status: DC

## 2017-10-16 MED ORDER — METOPROLOL SUCCINATE ER 50 MG PO TB24: 50.0000 mg | ORAL_TABLET | Freq: Every day | ORAL | 3 refills | Status: DC

## 2017-10-16 NOTE — Patient Instructions (Addendum)
Stop Metolprol Tartrate  Start Toprol XL 75 mg (3 tabs) daily  Take Xarelto 2.5 mg (1 tab). Twice a day  Your physician has requested that you have a lower or upper extremity arterial duplex. This test is an ultrasound of the arteries in the legs or arms. It looks at arterial blood flow in the legs and arms. Allow one hour for Lower and Upper Arterial scans. There are no restrictions or special instructions (they will call you)   Labs drawn today (if we do not call you, then your lab work was stable)   Your physician recommends that you schedule a follow-up appointment in: 6 months with Dr. Shirlee Latch

## 2017-10-17 ENCOUNTER — Encounter (HOSPITAL_COMMUNITY): Payer: Self-pay

## 2017-10-17 NOTE — Progress Notes (Signed)
Patient ID: Derrick Todd, male   DOB: [birth year], 57 y.o.   MRN: [id number] PCP: Dr. Tanya Nones Cardiology: Dr. Shirlee Latch  57 y.o. with history of CAD s/p NSTEMI in 5/13 and inferior STEMI in 12/14 presents for followup of CAD.  In 5/13, he had an NSTEMI with BMS to totally occluded distal RCA.  In 12/14, he had an inferior STEMI with 95% mid RCA (distal RCA stent patent) at [facility].  He had BMS to [facility]  Echo (12/14) showed EF 50-55%.  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  He quit smoking in 7/14.  Echo (6/15) showed EF 55-60% with basal to mid inferolateral hypokinesis (no significant change from prior).  He was admitted with unstable angina in 12/17, had DES to mid LAD 90% stenosis. Peripheral arterial dopplers in 8/18 showed 75-99% mid left femoral artery stenosis.   Echo in 12/18 with EF 55-60%.   He currently is feeling well.  Still working full time in the [employer].  No exertional dyspnea, no chest pain.  He is now tolerating Crestor 40 mg daily without problems.  No claudication.  He is not smoking.   Labs (5/13): LDL 122, HDL 32 Labs (6/13): K 3.9, creatinine 1.0 Labs (12/14): K 3.8, creatinine 1.0, LDL 153 Labs (3/15): LDL 88, HDL 43, hgbA1c 9.2 Labs (6/15): K 4, creatinine 1.0 Labs (9/17): LDL 212, hgb 14.2 Labs (9/18): LDL 60, HDL 44, K 3.9, creatinine 7.58, hgb 14.1 Labs (3/19): LDL 76, HDL 42, TGs 186, K 4.4, creatinine 1.2  PMH: 1. CAD: NSTEMI 5/13 with LHC showing 70% OM, 70% mLAD, total occlusion distal RCA, EF 55%.  Had 2 x 18 BMS to distal RCA.  Echo (5/13): EF 55-60% with basal inferior hypokinesis.  Inferior STEMI (12/14).  LHC showed 60% mLAD, 60-70% dLCx, 95% mRCA, patent dRCA stent.  Patient had BMS to [facility].  Echo (12/14) with EF 50-55% [facility]).  Echo (6/15) with EF 55-60%, basal to mid inferolateral hypokinesis.  - Unstable angina 12/17 with LHC showing 90% mLAD stenosis treated with DES and 80% distal LCx stenosis  (small vessel, medical management).  EF 50-55%.  - Echo (12/18): EF 55-60%.  2. Hyperlipidemia: myalgias with atorvastatin. Myalgias with Crestor but more manageable. Feels "foggy" when he takes Crestor daily.  3. Type II diabetes 4. Erectile dysfunction 5. Low testosterone 6. PAD: Peripheral arterial dopplers (8/18) with 75-99% mid left femoral artery stenosis.   SH: Works in [employer] at [employer]).  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  2 daughters.  Quit smoking in 7/14.   FH: CAD  ROS: All systems reviewed and negative except as per HPI.   Current Outpatient Medications  Medication Sig Dispense Refill  . ANDROGEL PUMP 20.25 MG/ACT (1.62%) GEL APPLY 1 PUMP DAILY AS DIRECTED 75 g 5  . ASPIR-LOW 81 MG EC tablet TAKE 1 TABLET (81 MG TOTAL) BY MOUTH DAILY. 30 tablet 1  . Coenzyme Q10 200 MG capsule Take 1 capsule (200 mg total) by mouth daily.    . cyclobenzaprine (FLEXERIL) 10 MG tablet Take 1 tablet (10 mg total) by mouth 3 (three) times daily as needed for muscle spasms. 30 tablet 0  . Empagliflozin-Linagliptin (GLYXAMBI) 25-5 MG TABS Take 1 tablet by mouth daily. 90 tablet 3  . glipiZIDE (GLUCOTROL XL) 10 MG 24 hr tablet Take 1 tablet (10 mg total) by mouth daily with breakfast. 90 tablet 3  . hydrOXYzine (  VISTARIL) 25 MG capsule Take 25 mg by mouth at bedtime as needed (sleep).    Boris Lown Oil 500 MG CAPS Take 500 mg by mouth daily.    Marland Kitchen lisinopril (PRINIVIL,ZESTRIL) 5 MG tablet TAKE 1 TABLET (5 MG TOTAL) BY MOUTH DAILY. 90 tablet 3  . magnesium oxide (MAG-OX) 400 MG tablet Take 400 mg by mouth daily.    . metFORMIN (GLUCOPHAGE) 1000 MG tablet Take 1 tablet (1,000 mg total) by mouth 2 (two) times daily with a meal. 180 tablet 3  . naproxen sodium (ANAPROX) 220 MG tablet Take 440 mg by mouth 2 (two) times daily with a meal.    . nitroGLYCERIN (NITROSTAT) 0.4 MG SL tablet Place 1 tablet (0.4 mg total) under the tongue every 5 (five) minutes x 3 doses as needed for chest pain. 25  tablet 4  . Omega-3 Fatty Acids (FISH OIL) 1000 MG CAPS Take 1,000 mg by mouth daily.     . pantoprazole (PROTONIX) 40 MG tablet Take 1 tablet (40 mg total) by mouth daily. 90 tablet 3  . potassium chloride (K-DUR,KLOR-CON) 10 MEQ tablet Take 10 mEq by mouth daily as needed (Take along with Lasix).    . rivaroxaban (XARELTO) 2.5 MG TABS tablet Take 1 tablet (2.5 mg total) by mouth 2 (two) times daily. 60 tablet 3  . rosuvastatin (CRESTOR) 40 MG tablet Take 1 tablet (40 mg total) by mouth daily. 30 tablet 11  . Vitamin D, Cholecalciferol, 400 units TABS Take 400 Units by mouth daily.     . furosemide (LASIX) 20 MG tablet Take 20 mg by mouth daily as needed for fluid or edema.    . metoprolol succinate (TOPROL-XL) 25 MG 24 hr tablet Take 3 tablets (75 mg total) by mouth daily. Take with or immediately following a meal. 90 tablet 2  . sildenafil (VIAGRA) 50 MG tablet Take 1 tablet (50 mg total) by mouth daily as needed for erectile dysfunction. DO NOT TAKE NITROGLYCERIN WITHIN 24 HOURS OF TAKING VIAGRA (Patient not taking: Reported on 10/16/2017) 15 tablet 1   No current facility-administered medications for this encounter.     BP 126/84   Pulse 87   Wt 229 lb 6.4 oz (104.1 kg)   SpO2 95%   BMI 29.45 kg/m  General: NAD Neck: No JVD, no thyromegaly or thyroid nodule.  Lungs: Clear to auscultation bilaterally with normal respiratory effort. CV: Nondisplaced PMI.  Heart regular S1/S2, no S3/S4, no murmur.  No peripheral edema.  No carotid bruit.  No PT pulse palpable on left.  Abdomen: Soft, nontender, no hepatosplenomegaly, no distention.  Skin: Intact without lesions or rashes.  Neurologic: Alert and oriented x 3.  Psych: Normal affect. Extremities: No clubbing or cyanosis.  HEENT: Normal.   Assessment/Plan: 1. CAD: Most recently had unstable angina in 12/17 with DES to mid LAD, had medically managed 80% distal LCx stenosis (small vessel).    - Continue ASA 81.  - Continue Xarelto 2.5 mg  bid (COMPASS regimen) given extensive vascular disease. I reminded him to take Xarelto bid (sometimes does not remember).  - Continue Crestor 40 mg daily, check lipids today.   - He has a hard time remembering bid meds, will stop metoprolol tartrate and start Toprol XL 75 mg daily.  2. Hyperlipidemia:  Tolerating Crestor 40 mg daily, check lipids today.   3. PAD: 75-99% mid left femoral artery stenosis on peripheral arterial dopplers from 8/18.  No definite claudication and no pedal ulcerations. Medical  management for now.  - Repeat peripheral arterial dopplers in 8/19.   Followup in 6 months.   Marca Ancona 10/17/2017

## 2017-11-06 MED FILL — XARELTO 2.5 MG TABS: 2.5 | 30 days supply | Qty: 60 | Fill #3

## 2017-11-09 ENCOUNTER — Other Ambulatory Visit: Payer: Self-pay | Admitting: Cardiology

## 2017-11-09 DIAGNOSIS — I739 Peripheral vascular disease, unspecified: Secondary | ICD-10-CM

## 2017-11-15 ENCOUNTER — Encounter (HOSPITAL_COMMUNITY): Payer: No Typology Code available for payment source

## 2017-11-22 ENCOUNTER — Encounter (HOSPITAL_COMMUNITY): Payer: No Typology Code available for payment source

## 2017-11-23 ENCOUNTER — Other Ambulatory Visit (HOSPITAL_COMMUNITY): Payer: Self-pay | Admitting: *Deleted

## 2017-11-23 DIAGNOSIS — I5022 Chronic systolic (congestive) heart failure: Secondary | ICD-10-CM

## 2017-11-29 ENCOUNTER — Encounter (HOSPITAL_COMMUNITY): Payer: No Typology Code available for payment source

## 2017-12-14 ENCOUNTER — Other Ambulatory Visit (HOSPITAL_COMMUNITY): Payer: Self-pay | Admitting: Cardiology

## 2017-12-14 MED FILL — XARELTO 2.5 MG TABS: 2.5 | 30 days supply | Qty: 60 | Fill #0

## 2017-12-18 ENCOUNTER — Telehealth (HOSPITAL_COMMUNITY): Payer: Self-pay | Admitting: *Deleted

## 2017-12-18 ENCOUNTER — Other Ambulatory Visit (HOSPITAL_COMMUNITY): Payer: Self-pay | Admitting: *Deleted

## 2017-12-18 NOTE — Telephone Encounter (Signed)
Patient ordered for LE Arterial dopplers at [facility] office per Dr. Shirlee Latch.  Patient has cancelled appt 2 separate occasions. I called and left VM reminding patient to call [facility] office and have it rescheduled as soon as possible.

## 2017-12-25 ENCOUNTER — Telehealth: Payer: Self-pay

## 2017-12-25 NOTE — Telephone Encounter (Signed)
Derrick Todd - can you let him know I'm happy to see him and will manage him along with Lorin Picket or Vin? thanks

## 2017-12-25 NOTE — Telephone Encounter (Signed)
This is a patient of Dr. Shirlee Latch who informed scheduling he wanted to switch providers to Dr. Excell Seltzer.  Per policy, will send to Dr. Excell Seltzer and Dr. Shirlee Latch to agree/disagree switch.

## 2017-12-25 NOTE — Telephone Encounter (Signed)
He is a coronary disease patient who has been seeing me in CHF clinic for a while. I had told him it would be fine to be seen at [facility] to avoid facility fees, think Kathlene November may have done PCI on him in the past.  Will have to see if Kathlene November has room to fit him in, if not I can keep seeing him at CHF clinic but will have to pay facility fee or can seen another cardiologist at [facility].

## 2017-12-29 ENCOUNTER — Other Ambulatory Visit: Payer: Self-pay | Admitting: Family Medicine

## 2018-01-01 MED FILL — METOPROLOL SUCCINATE ER 25: 25 | 30 days supply | Qty: 90 | Fill #0

## 2018-01-02 MED FILL — glipiZIDE ER 10 MG TB24: 10 | 90 days supply | Qty: 90 | Fill #0

## 2018-01-02 MED FILL — TESTOSTERONE 20.25 MG/ACT (: 20.25 MG/AC | 60 days supply | Qty: 75 | Fill #0

## 2018-01-02 MED FILL — metFORMIN HCL 1000 MG TABS: 1000 | 90 days supply | Qty: 180 | Fill #0

## 2018-01-02 NOTE — Telephone Encounter (Signed)
Requesting refill    Testosterone  (needs ov and labs - was put on rx)   LOV: 07/31/17  LRF:  06/01/17

## 2018-01-03 NOTE — Telephone Encounter (Signed)
Left message to call back  

## 2018-01-05 MED FILL — XARELTO 2.5 MG TABS: 2.5 | 30 days supply | Qty: 60 | Fill #1

## 2018-01-11 NOTE — Telephone Encounter (Signed)
Per DPR form, left message that Dr. Excell Seltzer is happy to follow him and he will be managed along with his assistant.  Instructed him to call if he has questions or concerns prior to OV.

## 2018-01-15 MED FILL — SULFAMETHOXAZOLE-TMP DS TAB: 800-160 | 10 days supply | Qty: 20 | Fill #0

## 2018-02-08 ENCOUNTER — Ambulatory Visit (INDEPENDENT_AMBULATORY_CARE_PROVIDER_SITE_OTHER): Payer: No Typology Code available for payment source | Admitting: Family Medicine

## 2018-02-08 ENCOUNTER — Encounter: Payer: Self-pay | Admitting: Family Medicine

## 2018-02-08 VITALS — BP 128/72 | HR 92 | Temp 98.0°F | Resp 16 | Ht 74.0 in | Wt 231.0 lb

## 2018-02-08 DIAGNOSIS — E291 Testicular hypofunction: Secondary | ICD-10-CM | POA: Diagnosis not present

## 2018-02-08 DIAGNOSIS — E119 Type 2 diabetes mellitus without complications: Secondary | ICD-10-CM | POA: Diagnosis not present

## 2018-02-08 DIAGNOSIS — M5432 Sciatica, left side: Secondary | ICD-10-CM
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
## 2018-02-09 ENCOUNTER — Telehealth: Payer: Self-pay | Admitting: Family Medicine

## 2018-02-09 NOTE — Telephone Encounter (Signed)
Received fmla paperwork via fax  Will route to sandy

## 2018-02-09 NOTE — Telephone Encounter (Signed)
Routed paperwork to Dr. Tanya Nones

## 2018-02-10 ENCOUNTER — Encounter: Payer: Self-pay | Admitting: Family Medicine

## 2018-02-13 ENCOUNTER — Telehealth: Payer: Self-pay | Admitting: Family Medicine

## 2018-02-13 LAB — COMPLETE METABOLIC PANEL WITH GFR
AG RATIO: 1.8 (calc) (ref 1.0–2.5)
ALT: 20 U/L (ref 9–46)
AST: 17 U/L (ref 10–35)
Albumin: 4.1 g/dL (ref 3.6–5.1)
Alkaline phosphatase (APISO): 55 U/L (ref 40–115)
BILIRUBIN TOTAL: 0.7 mg/dL (ref 0.2–1.2)
BUN: 14 mg/dL (ref 7–25)
CHLORIDE: 106 mmol/L (ref 98–110)
CO2: 23 mmol/L (ref 20–32)
Calcium: 9.2 mg/dL (ref 8.6–10.3)
Creat: 1.01 mg/dL (ref 0.70–1.33)
GFR, EST AFRICAN AMERICAN: 94 mL/min/{1.73_m2} (ref >=60)
GFR, Est Non African American: 81 mL/min/{1.73_m2} (ref >=60)
Globulin: 2.3 g/dL (calc) (ref 1.9–3.7)
Glucose, Bld: 135 mg/dL — ABNORMAL HIGH (ref 65–99)
POTASSIUM: 4.3 mmol/L (ref 3.5–5.3)
SODIUM: 139 mmol/L (ref 135–146)
TOTAL PROTEIN: 6.4 g/dL (ref 6.1–8.1)

## 2018-02-13 LAB — CBC WITH DIFFERENTIAL/PLATELET
BASOS ABS: 48 {cells}/uL (ref 0–200)
Basophils Relative: 0.9 %
EOS PCT: 6.4 %
Eosinophils Absolute: 339 cells/uL (ref 15–500)
HCT: 38.3 % — ABNORMAL LOW (ref 38.5–50.0)
Hemoglobin: 12.7 g/dL — ABNORMAL LOW (ref 13.2–17.1)
Lymphs Abs: 1044 cells/uL (ref 850–3900)
MCH: 24.1 pg — ABNORMAL LOW (ref 27.0–33.0)
MCHC: 33.2 g/dL (ref 32.0–36.0)
MCV: 72.8 fL — ABNORMAL LOW (ref 80.0–100.0)
MONOS PCT: 13.3 %
MPV: 9.9 fL (ref 7.5–12.5)
NEUTROS PCT: 59.7 %
Neutro Abs: 3164 cells/uL (ref 1500–7800)
PLATELETS: 255 10*3/uL (ref 140–400)
RBC: 5.26 10*6/uL (ref 4.20–5.80)
RDW: 15.3 % — ABNORMAL HIGH (ref 11.0–15.0)
TOTAL LYMPHOCYTE: 19.7 %
WBC mixed population: 705 cells/uL (ref 200–950)
WBC: 5.3 10*3/uL (ref 3.8–10.8)

## 2018-02-13 LAB — HEMOGLOBIN A1C
Hgb A1c MFr Bld: 7.3 % of total Hgb — ABNORMAL HIGH (ref <5.7)
MEAN PLASMA GLUCOSE: 163 (calc)
eAG (mmol/L): 9 (calc)

## 2018-02-13 LAB — TESTOSTERONE TOTAL,FREE,BIO, MALES
ALBUMIN MSPROF: 4.1 g/dL (ref 3.6–5.1)
SEX HORMONE BINDING: 13 nmol/L — AB (ref 22–77)
TESTOSTERONE BIOAVAILABLE: 132.2 ng/dL (ref >=110.0)
TESTOSTERONE FREE: 70.2 pg/mL (ref 46.0–224.0)
TESTOSTERONE: 279 ng/dL (ref 250–827)

## 2018-02-13 LAB — IRON: IRON: 59 ug/dL (ref 50–180)

## 2018-02-13 LAB — TEST AUTHORIZATION

## 2018-02-13 LAB — PSA: PSA: 0.3 ng/mL (ref <=4.0)

## 2018-02-13 LAB — MICROALBUMIN, URINE: Microalb, Ur: 0.4 mg/dL

## 2018-02-13 NOTE — Telephone Encounter (Signed)
RECEIVED FMLA PAPERWORK FOR THIS PATIENT  WILL ROUTE THESE TO SANDY

## 2018-02-13 NOTE — Telephone Encounter (Signed)
Paperwork completed and given to Darl Pikes for collection of payment.

## 2018-02-13 NOTE — Telephone Encounter (Signed)
These have already been done and you have the finished paperwork up front.

## 2018-02-17 ENCOUNTER — Encounter: Payer: Self-pay | Admitting: Family Medicine

## 2018-03-05 ENCOUNTER — Ambulatory Visit: Payer: No Typology Code available for payment source | Admitting: Cardiovascular Disease

## 2018-03-05 ENCOUNTER — Encounter: Payer: Self-pay | Admitting: Cardiovascular Disease

## 2018-03-22 ENCOUNTER — Other Ambulatory Visit (HOSPITAL_COMMUNITY): Payer: Self-pay | Admitting: Cardiology

## 2018-03-22 ENCOUNTER — Other Ambulatory Visit: Payer: Self-pay | Admitting: Family Medicine

## 2018-03-22 DIAGNOSIS — I2510 Atherosclerotic heart disease of native coronary artery without angina pectoris: Secondary | ICD-10-CM

## 2018-03-22 DIAGNOSIS — Z720 Tobacco use: Secondary | ICD-10-CM

## 2018-03-22 MED FILL — GLYXAMBI 25 MG-5 MG TABLET: 25-5 | 90 days supply | Qty: 90 | Fill #0

## 2018-03-22 MED FILL — SILDENAFIL CITRATE 50 MG TA: 50 | 75 days supply | Qty: 15 | Fill #0

## 2018-03-22 MED FILL — glipiZIDE ER 10 MG TB24: 10 | 90 days supply | Qty: 90 | Fill #0

## 2018-03-22 MED FILL — TESTOSTERONE 20.25 MG/ACT (: 20.25 MG/AC | 60 days supply | Qty: 75 | Fill #0

## 2018-03-22 MED FILL — LISINOPRIL 5 MG TABLET: 5 | 90 days supply | Qty: 90 | Fill #0

## 2018-03-22 MED FILL — METOPROLOL SUCCINATE ER 25: 25 | 30 days supply | Qty: 90 | Fill #1

## 2018-03-22 MED FILL — metFORMIN HCL 1000 MG TABS: 1000 | 90 days supply | Qty: 180 | Fill #0

## 2018-03-22 MED FILL — PANTOPRAZOLE SOD DR 40 MG T: 40 | 90 days supply | Qty: 90 | Fill #0

## 2018-03-22 MED FILL — ROSUVASTATIN CALCIUM 40 MG: 40 | 90 days supply | Qty: 90 | Fill #3

## 2018-03-22 MED FILL — XARELTO 2.5 MG TABS: 2.5 | 30 days supply | Qty: 60 | Fill #2

## 2018-03-22 NOTE — Telephone Encounter (Signed)
Requesting refill   Testosterone   LOV: 02/08/18  LRF:  01/02/18

## 2018-03-28 IMAGING — NM NM MISC PROCEDURE
3 series · 18 of 18 positions shown · non-contrast
Comparison: none

[Series 1: wbr_s-proj_st stress_(id)_sa · 6.5mm · 6.51mm/px · 6 of 512 frames shown (1 of 2)]
[frame 43/512]
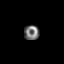
[frame 128/512]
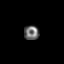
[frame 214/512]
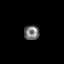
[frame 299/512]
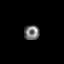
[frame 384/512]
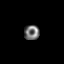
[frame 470/512]
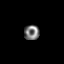

[Series 1: wbr_r-proj_st rest_(id)_sa · 6.5mm · 6.51mm/px · 6 of 64 frames shown]
[frame 6/64]
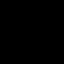
[frame 16/64]
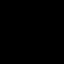
[frame 27/64]
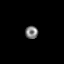
[frame 38/64]
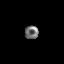
[frame 48/64]
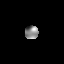
[frame 59/64]
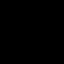

[Series 1: wbr_s-proj_st stress_(id)_sa · 6.5mm · 6.51mm/px · 6 of 64 frames shown (2 of 2)]
[frame 6/64]
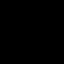
[frame 16/64]
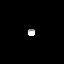
[frame 27/64]
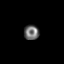
[frame 38/64]
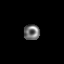
[frame 48/64]
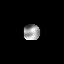
[frame 59/64]
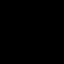

[18 of 18 positions shown; findings below may reference images not displayed]

Canned report from images found in remote index.

Refer to host system for actual result text.

## 2018-04-20 ENCOUNTER — Encounter: Payer: Self-pay | Admitting: Family Medicine

## 2018-04-20 ENCOUNTER — Ambulatory Visit (INDEPENDENT_AMBULATORY_CARE_PROVIDER_SITE_OTHER): Payer: No Typology Code available for payment source | Admitting: Family Medicine

## 2018-04-20 VITALS — BP 110/60 | HR 90 | Temp 98.3°F | Resp 18 | Ht 74.0 in | Wt 238.0 lb

## 2018-04-20 DIAGNOSIS — M25561 Pain in right knee: Secondary | ICD-10-CM | POA: Diagnosis not present

## 2018-04-20 MED ORDER — HYDROCODONE-ACETAMINOPHEN 5-325 MG PO TABS: 1.0000 | ORAL_TABLET | Freq: Four times a day (QID) | ORAL | 0 refills | Status: DC | PRN
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
## 2018-05-02 ENCOUNTER — Ambulatory Visit (INDEPENDENT_AMBULATORY_CARE_PROVIDER_SITE_OTHER): Payer: No Typology Code available for payment source | Admitting: Orthopaedic Surgery

## 2018-05-02 ENCOUNTER — Ambulatory Visit (INDEPENDENT_AMBULATORY_CARE_PROVIDER_SITE_OTHER): Payer: No Typology Code available for payment source

## 2018-05-02 ENCOUNTER — Encounter (INDEPENDENT_AMBULATORY_CARE_PROVIDER_SITE_OTHER): Payer: Self-pay | Admitting: Orthopaedic Surgery

## 2018-05-02 DIAGNOSIS — G8929 Other chronic pain: Secondary | ICD-10-CM

## 2018-05-02 DIAGNOSIS — M25561 Pain in right knee: Secondary | ICD-10-CM
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
## 2018-05-15 ENCOUNTER — Other Ambulatory Visit (INDEPENDENT_AMBULATORY_CARE_PROVIDER_SITE_OTHER): Payer: Self-pay

## 2018-05-15 ENCOUNTER — Telehealth (INDEPENDENT_AMBULATORY_CARE_PROVIDER_SITE_OTHER): Payer: Self-pay | Admitting: Orthopaedic Surgery

## 2018-05-15 DIAGNOSIS — M25561 Pain in right knee: Principal | ICD-10-CM

## 2018-05-15 DIAGNOSIS — G8929 Other chronic pain: Secondary | ICD-10-CM

## 2018-05-15 MED FILL — METOPROLOL SUCCINATE ER 25: 25 | 30 days supply | Qty: 90 | Fill #2

## 2018-05-15 NOTE — Telephone Encounter (Signed)
Patient stated he was told at last appt that he was getting a MRI and someone would call him to schedule. He had a f/u scheduled but no order in system to auth MRI.   Please put order in and call patient to advise. Cx appt for 05/16/2018. Advised patient to call us once MRI is scheduled so we can sch a f/u appt

## 2018-05-15 NOTE — Telephone Encounter (Signed)
Can we send him ASAP since somehow this didn't get done? I just sent in an order

## 2018-05-16 ENCOUNTER — Ambulatory Visit (INDEPENDENT_AMBULATORY_CARE_PROVIDER_SITE_OTHER): Payer: No Typology Code available for payment source | Admitting: Orthopaedic Surgery

## 2018-05-16 ENCOUNTER — Telehealth (INDEPENDENT_AMBULATORY_CARE_PROVIDER_SITE_OTHER): Payer: Self-pay | Admitting: Orthopaedic Surgery

## 2018-05-16 MED FILL — HYDROCODON-APAP 5-325: 5-325 | 8 days supply | Qty: 30 | Fill #0

## 2018-05-16 MED FILL — XARELTO 2.5 MG TABS: 2.5 | 30 days supply | Qty: 60 | Fill #3 | Status: TO

## 2018-05-16 NOTE — Telephone Encounter (Signed)
Order is in and [facility] has been contacted and made aware of STAT order, I sw Derrick Todd with [facility] and she will give him a call to schedule and can not get him in until possibly Sunday.

## 2018-05-16 NOTE — Telephone Encounter (Signed)
IC and sw pt and advises him that was just placed late yesterday and that imaging will be giving him a call to schedule but I will contact [facility] and advise them STAT order has been placed so they can expedite, I advised pt once appt has been scheduled I will call [employer] and try to get him approved for the scan. Pt is aware I will call to let him know when approved per his request.

## 2018-05-16 NOTE — Telephone Encounter (Signed)
Patient called asked if his insurance approved for him to have the MRI? The number to contact patient is [phone]

## 2018-05-17 NOTE — Telephone Encounter (Signed)
[employer] authorization [id number], valid 05/17/18-06/16/18, I called pt and advised him of authorization and to contact [facility] since they have tried to call with no answer, pt states he will call in 30-40mins once he gets to his destination.

## 2018-05-25 ENCOUNTER — Other Ambulatory Visit: Payer: No Typology Code available for payment source

## 2018-05-25 ENCOUNTER — Ambulatory Visit
Admission: RE | Admit: 2018-05-25 | Discharge: 2018-05-25 | Disposition: A | Discharge status: Home / Self Care | Payer: No Typology Code available for payment source | Source: Ambulatory Visit | Attending: Orthopaedic Surgery | Admitting: Orthopaedic Surgery

## 2018-05-25 DIAGNOSIS — G8929 Other chronic pain: Secondary | ICD-10-CM

## 2018-05-25 DIAGNOSIS — M25561 Pain in right knee: Principal | ICD-10-CM

## 2018-05-30 ENCOUNTER — Encounter (INDEPENDENT_AMBULATORY_CARE_PROVIDER_SITE_OTHER): Payer: Self-pay | Admitting: Physician Assistant

## 2018-05-30 ENCOUNTER — Encounter (HOSPITAL_COMMUNITY): Payer: Self-pay

## 2018-05-30 ENCOUNTER — Ambulatory Visit (INDEPENDENT_AMBULATORY_CARE_PROVIDER_SITE_OTHER): Payer: No Typology Code available for payment source | Admitting: Physician Assistant

## 2018-05-30 DIAGNOSIS — S83282A Other tear of lateral meniscus, current injury, left knee, initial encounter: Secondary | ICD-10-CM

## 2018-05-30 NOTE — Progress Notes (Signed)
HPI: Derrick Todd returns today for follow-up of his right knee.  Continues to have pain over the lateral aspect of his knee.  He feels that walking makes his knee actually worse.  Has had no locking of the knee.  States his pain is 6 out of 10 pain.  Is taking ibuprofen.  He feels that he does have fluid collection on the knee.  Returns today to go over the MRI of his knee.  MRI dated 05/25/2018 right knee was reviewed with the patient actual images reviewed.  Lateral compartment with a occult flap tear of the mid body and adjacent anterior and posterior horn.  Grade 3 signal compatible with a horizontal tear in the posterior horn.  Moderate knee effusion.  Mild degenerative chondral thinning medial compartment lateral compartment moderate marginal spurring and moderate chondral thinning patellofemoral joint moderate chondral thinning of the femoral trochlear groove.  Impression: Lateral meniscal tears right knee Mild to moderate degenerative changes right knee.  Plan: Due to patient's failure of conservative treatment and continued pain and mechanical symptoms recommend knee arthroscopy with partial lateral meniscectomy.  Discussed risk benefits with patient including but not limited to infection, prolonged pain, DVT/PE.  He is on Xarelto with a history of cardiac stents placement 2 years ago.  He will need cardiac clearance and need to come off the Xarelto for at least 2 days prior to surgery.  He will discuss this with his cardiologist see if he needs to be bridged and once he has clearance we will place him on the schedule for a right knee arthroscopy with lateral partial meniscectomy.

## 2018-06-01 ENCOUNTER — Telehealth (INDEPENDENT_AMBULATORY_CARE_PROVIDER_SITE_OTHER): Payer: Self-pay

## 2018-06-01 NOTE — Telephone Encounter (Signed)
Patient called and states that Dr. Magnus Ivan advised that he would need cards clearance prior to having knee surgery. He states that Dr. Marca Ancona his cardiologist sent him a message in my chart that he needs the clearance paperwork and he would fill this out for the pt. He said would not need appt? Advised would send message

## 2018-06-04 NOTE — Telephone Encounter (Signed)
Faxed clearance request to Dr. Shirlee Latch.

## 2018-06-06 ENCOUNTER — Encounter (HOSPITAL_COMMUNITY): Payer: Self-pay

## 2018-06-18 ENCOUNTER — Telehealth (HOSPITAL_COMMUNITY): Payer: Self-pay

## 2018-06-18 NOTE — Telephone Encounter (Signed)
Pre op clearance faxed to [facility] orthopedics

## 2018-06-19 ENCOUNTER — Telehealth (INDEPENDENT_AMBULATORY_CARE_PROVIDER_SITE_OTHER): Payer: Self-pay | Admitting: Orthopaedic Surgery

## 2018-06-19 NOTE — Telephone Encounter (Signed)
Pt called in to let us know his pre op clearance was faxed yesterday, and would like for someone to gil him a call to let him know what's going on.  [phone]

## 2018-06-20 NOTE — Telephone Encounter (Signed)
I called patient and left voice mail for return call. °

## 2018-06-21 ENCOUNTER — Other Ambulatory Visit (HOSPITAL_COMMUNITY): Payer: Self-pay | Admitting: Cardiology

## 2018-06-21 ENCOUNTER — Other Ambulatory Visit: Payer: Self-pay | Admitting: Family Medicine

## 2018-06-21 MED FILL — ROSUVASTATIN CALCIUM 40 MG: 40 | 90 days supply | Qty: 90 | Fill #0

## 2018-06-21 MED FILL — TESTOSTERONE 20.25 MG/ACT (: 20.25 MG/AC | 60 days supply | Qty: 75 | Fill #1

## 2018-06-21 MED FILL — LISINOPRIL 5 MG TABLET: 5 | 90 days supply | Qty: 90 | Fill #1

## 2018-06-21 MED FILL — PANTOPRAZOLE SOD DR 40 MG T: 40 | 90 days supply | Qty: 90 | Fill #1

## 2018-06-21 MED FILL — glipiZIDE ER 10 MG TB24: 10 | 90 days supply | Qty: 90 | Fill #1

## 2018-06-21 MED FILL — METOPROLOL SUCCINATE ER 25: 25 | 30 days supply | Qty: 90 | Fill #0 | Status: TO

## 2018-06-21 MED FILL — SILDENAFIL CITRATE 50 MG TA: 50 | 75 days supply | Qty: 15 | Fill #1

## 2018-06-21 MED FILL — GLYXAMBI 25 MG-5 MG TABLET: 25-5 | 90 days supply | Qty: 90 | Fill #0

## 2018-06-21 MED FILL — metFORMIN HCL 1000 MG TABS: 1000 | 90 days supply | Qty: 180 | Fill #1

## 2018-06-21 NOTE — Telephone Encounter (Signed)
Patient called back.  I advised due to [facility] Virus, not scheduling surgeries right now.  He states he is in extreme pain and cannot function.  Please advise him what he can do in the meantime.  [phone]

## 2018-06-21 NOTE — Telephone Encounter (Signed)
Patient states he is doing all these things and nothing is helping, asking if he can have an injection?

## 2018-06-21 NOTE — Telephone Encounter (Signed)
Nothing he can do except rest his knee, try a cane in the opposite hand, and over-the-counter pain meds/NSAIDs

## 2018-06-21 NOTE — Telephone Encounter (Signed)
Please advise 

## 2018-06-22 NOTE — Telephone Encounter (Signed)
Ok to bring in for an injection

## 2018-06-22 NOTE — Telephone Encounter (Signed)
Can we work him in next week for an injection

## 2018-06-25 ENCOUNTER — Other Ambulatory Visit: Payer: Self-pay

## 2018-06-25 ENCOUNTER — Ambulatory Visit (INDEPENDENT_AMBULATORY_CARE_PROVIDER_SITE_OTHER): Payer: No Typology Code available for payment source | Admitting: Orthopaedic Surgery

## 2018-06-25 ENCOUNTER — Encounter (INDEPENDENT_AMBULATORY_CARE_PROVIDER_SITE_OTHER): Payer: Self-pay | Admitting: Orthopaedic Surgery

## 2018-06-25 DIAGNOSIS — S83281A Other tear of lateral meniscus, current injury, right knee, initial encounter: Secondary | ICD-10-CM

## 2018-06-25 MED ORDER — METHYLPREDNISOLONE ACETATE 40 MG/ML IJ SUSP
40.0000 mg | INTRAMUSCULAR | Status: AC | PRN
  Administered 2018-06-25: 40 mg via INTRA_ARTICULAR

## 2018-06-25 MED ORDER — LIDOCAINE HCL 1 % IJ SOLN
3.0000 mL | INTRAMUSCULAR | Status: AC | PRN
  Administered 2018-06-25: 3 mL
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
## 2018-07-13 ENCOUNTER — Encounter (HOSPITAL_COMMUNITY): Payer: No Typology Code available for payment source | Admitting: Cardiology

## 2018-07-17 ENCOUNTER — Other Ambulatory Visit (HOSPITAL_COMMUNITY): Payer: Self-pay | Admitting: Cardiology

## 2018-07-18 MED FILL — XARELTO 2.5 MG TABS: 2.5 | 90 days supply | Qty: 180 | Fill #0

## 2018-07-23 ENCOUNTER — Encounter (HOSPITAL_COMMUNITY): Payer: No Typology Code available for payment source | Admitting: Cardiology

## 2018-07-31 ENCOUNTER — Ambulatory Visit (HOSPITAL_COMMUNITY)
Admission: RE | Admit: 2018-07-31 | Discharge: 2018-07-31 | Disposition: A | Discharge status: Home / Self Care | Payer: No Typology Code available for payment source | Source: Ambulatory Visit | Attending: Cardiology | Admitting: Cardiology

## 2018-07-31 ENCOUNTER — Encounter (HOSPITAL_COMMUNITY): Payer: Self-pay

## 2018-07-31 ENCOUNTER — Other Ambulatory Visit: Payer: Self-pay

## 2018-07-31 DIAGNOSIS — I2510 Atherosclerotic heart disease of native coronary artery without angina pectoris: Secondary | ICD-10-CM | POA: Diagnosis not present

## 2018-07-31 DIAGNOSIS — I5032 Chronic diastolic (congestive) heart failure: Secondary | ICD-10-CM | POA: Diagnosis not present

## 2018-07-31 NOTE — Progress Notes (Signed)
Spoke with pt to review after visit summary. Pt agreeable to follow up appointments. Pt concerned about covid 19. Advised pt to contact HR to send [place] fmla paper work for heart failure. Advised pt that we are not writing patients out of work for covid 19 risk at this time.

## 2018-07-31 NOTE — Patient Instructions (Signed)
Lab work needs to be done. You have been scheduled for April 29th at 8:45am. The parking code for April is 8007.  Your physician has requested that you have an ankle brachial index (ABI). During this test an ultrasound and blood pressure cuff are used to evaluate the arteries that supply the arms and legs with blood. Allow thirty minutes for this exam. There are no restrictions or special instructions. This will be scheduled when they are available again.  Please follow up with Dr. Shirlee Latch in 6 months.

## 2018-07-31 NOTE — Progress Notes (Signed)
Heart Failure TeleHealth Note  Due to national recommendations of social distancing due to COVID 19, Audio/video telehealth visit is felt to be most appropriate for this patient at this time.  See MyChart message from today for patient consent regarding telehealth for [facility].  Date:  07/31/2018   ID:  Derrick Todd, DOB [birth year], MRN [id number]  Location: Home  Provider location: [place] [facility] Type of Visit: Established patient   PCP:  Donita Kemp, MD  Cardiologist:  Marca Ancona, MD  Chief Complaint: Shortness of breath   History of Present Illness: Derrick Todd is a 58 y.o. male who presents via audio/video conferencing for a telehealth visit today.     he denies symptoms worrisome for COVID 19.   Patient has history of CAD s/p NSTEMI in 5/13 and inferior STEMI in 12/14.  In 5/13, he had an NSTEMI with BMS to totally occluded distal RCA.  In 12/14, he had an inferior STEMI with 95% mid RCA (distal RCA stent patent) at [facility].  He had BMS to [facility].  Echo (12/14) showed EF 50-55%.  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  He quit smoking in 7/14.  Echo (6/15) showed EF 55-60% with basal to mid inferolateral hypokinesis (no significant change from prior).  He was admitted with unstable angina in 12/17, had DES to mid LAD 90% stenosis. Peripheral arterial dopplers in 8/18 showed 75-99% mid left femoral artery stenosis.   Echo in 12/18 with EF 55-60%.   He is doing well, working full time as a Engineer, civil (consulting) in the [employer].  Main complaint is pain from right knee lateral meniscal tear, plan for surgery when outpatient procedures restart.  No chest pain.  He notes mild dyspnea carrying bags of soil out in his yard, but otherwise no significant exertional dyspnea.  He has not noted claudication.  Rare NSAID use.  Glucose appears to be relatively controlled.    Labs (5/13): LDL 122, HDL 32 Labs (6/13): K  3.9, creatinine 1.0 Labs (12/14): K 3.8, creatinine 1.0, LDL 153 Labs (3/15): LDL 88, HDL 43, hgbA1c 9.2 Labs (6/15): K 4, creatinine 1.0 Labs (9/17): LDL 212, hgb 14.2 Labs (9/18): LDL 60, HDL 44, K 3.9, creatinine 4.09, hgb 14.1 Labs (3/19): LDL 76, HDL 42, TGs 186, K 4.4, creatinine 1.2 Labs (7/19): LDL 76, HDL 55 Labs (11/19): K 4.3, creatinine 1.01, hgb 12.7  PMH: 1. CAD: NSTEMI 5/13 with LHC showing 70% OM, 70% mLAD, total occlusion distal RCA, EF 55%.  Had 2 x 18 BMS to distal RCA.  Echo (5/13): EF 55-60% with basal inferior hypokinesis.  Inferior STEMI (12/14).  LHC showed 60% mLAD, 60-70% dLCx, 95% mRCA, patent dRCA stent.  Patient had BMS to [facility].  Echo (12/14) with EF 50-55% [facility]).  Echo (6/15) with EF 55-60%, basal to mid inferolateral hypokinesis.  - Unstable angina 12/17 with LHC showing 90% mLAD stenosis treated with DES and 80% distal LCx stenosis (small vessel, medical management).  EF 50-55%.  - Echo (12/18): EF 55-60%.  2. Hyperlipidemia: myalgias with atorvastatin. Myalgias with Crestor but more manageable. Feels "foggy" when he takes Crestor daily.  3. Type II diabetes 4. Erectile dysfunction 5. Low testosterone 6. PAD: Peripheral arterial dopplers (8/18) with 75-99% mid left femoral artery stenosis.   Current Outpatient Medications  Medication Sig Dispense Refill  . ASPIR-LOW 81 MG EC tablet TAKE 1 TABLET (81 MG  TOTAL) BY MOUTH DAILY. 30 tablet 1  . Coenzyme Q10 200 MG capsule Take 1 capsule (200 mg total) by mouth daily.    . cyclobenzaprine (FLEXERIL) 10 MG tablet Take 1 tablet (10 mg total) by mouth 3 (three) times daily as needed for muscle spasms. 30 tablet 0  . furosemide (LASIX) 20 MG tablet Take 20 mg by mouth daily as needed for fluid or edema.    Marland Kitchen glipiZIDE (GLUCOTROL XL) 10 MG 24 hr tablet TAKE 1 TABLET BY MOUTH DAILY WITH BREAKFAST 90 tablet 1  . GLYXAMBI 25-5 MG TABS TAKE 1 TABLET BY MOUTH DAILY. 90 tablet 0  . HYDROcodone-acetaminophen (NORCO)  5-325 MG tablet Take 1 tablet by mouth every 6 (six) hours as needed for moderate pain. 30 tablet 0  . hydrOXYzine (VISTARIL) 25 MG capsule Take 25 mg by mouth at bedtime as needed (sleep).    Boris Lown Oil 500 MG CAPS Take 500 mg by mouth daily.    Marland Kitchen lisinopril (PRINIVIL,ZESTRIL) 5 MG tablet TAKE 1 TABLET BY MOUTH DAILY. 90 tablet 1  . magnesium oxide (MAG-OX) 400 MG tablet Take 400 mg by mouth daily.    . metFORMIN (GLUCOPHAGE) 1000 MG tablet TAKE 1 TABLET BY MOUTH 2 TIMES DAILY WITH A MEAL. 180 tablet 1  . metoprolol succinate (TOPROL-XL) 25 MG 24 hr tablet     . metoprolol succinate (TOPROL-XL) 25 MG 24 hr tablet TAKE 3 TABLETS BY MOUTH DAILY. TAKE WITH OR IMMEDIATELY FOLLOWING A MEAL. 90 tablet 2  . nitroGLYCERIN (NITROSTAT) 0.4 MG SL tablet Place 1 tablet (0.4 mg total) under the tongue every 5 (five) minutes x 3 doses as needed for chest pain. 25 tablet 4  . Omega-3 Fatty Acids (FISH OIL) 1000 MG CAPS Take 1,000 mg by mouth daily.     . pantoprazole (PROTONIX) 40 MG tablet TAKE 1 TABLET (40 MG TOTAL) BY MOUTH DAILY. 90 tablet 1  . potassium chloride (K-DUR,KLOR-CON) 10 MEQ tablet Take 10 mEq by mouth daily as needed (Take along with Lasix).    . rosuvastatin (CRESTOR) 40 MG tablet TAKE 1 TABLET (40 MG TOTAL) BY MOUTH DAILY. 30 tablet 11  . sildenafil (VIAGRA) 50 MG tablet TAKE 1 TABLET (50 MG TOTAL) BY MOUTH DAILY AS NEEDED FOR ERECTILE DYSFUNCTION. DO NOT TAKE NITROGLYCERIN WITHIN 24 HOURS OF TAKING VIAGRA 15 tablet 1  . Testosterone 20.25 MG/ACT (1.62%) GEL APPLY 1 PUMP DAILY AS DIRECTED 75 g 3  . Vitamin D, Cholecalciferol, 400 units TABS Take 400 Units by mouth daily.     Carlena Hurl 2.5 MG TABS tablet TAKE 1 TABLET BY MOUTH 2 TIMES DAILY. 180 tablet 3   No current facility-administered medications for this encounter.     Allergies:   Livalo [pitavastatin]   Social History:  The patient  reports that he quit smoking about 7 years ago. His smoking use included cigarettes. He has never  used smokeless tobacco. He reports current alcohol use. He reports that he does not use drugs.   Family History:  The patient's family history includes Arthritis in his mother; Depression in his mother; Diabetes in his brother and mother; Heart attack in his maternal grandfather; Heart failure in his maternal grandfather; Hyperlipidemia in his mother; Hypertension in his brother, father, maternal grandfather, and mother; Learning disabilities in his mother; Mental illness in his mother; Stroke in his father and mother.   ROS:  Please see the history of present illness.   All other systems are personally reviewed  and negative.   Exam:  (Video/Tele Health Call; Exam is subjective and or/visual.) General:  Speaks in full sentences. No resp difficulty. Neck: No JVD Lungs: Normal respiratory effort with conversation.  Abdomen: Non-distended per patient report Extremities: Pt denies edema. Neuro: Alert & oriented x 3.   Recent Labs: 02/08/2018: ALT 20; BUN 14; Creat 1.01; Hemoglobin 12.7; Platelets 255; Potassium 4.3; Sodium 139  Personally reviewed   Wt Readings from Last 3 Encounters:  04/20/18 108 kg (238 lb)  02/08/18 104.8 kg (231 lb)  10/16/17 104.1 kg (229 lb 6.4 oz)      ASSESSMENT AND PLAN:  1. CAD: Most recently had unstable angina in 12/17 with DES to mid LAD, had medically managed 80% distal LCx stenosis (small vessel).   No chest pain.  - Continue ASA 81.  - Continue Xarelto 2.5 mg bid (COMPASS regimen) given extensive vascular disease.  - Continue Crestor 40 mg daily, arrange to check lipids.  - Continue current Toprol XL and lisinopril.  2. Hyperlipidemia:  Tolerating Crestor 40 mg daily, check lipids today.   3. PAD: 75-99% mid left femoral artery stenosis on peripheral arterial dopplers from 8/18.  No definite claudication and no pedal ulcerations. Medical management for now.  - I will arrange for repeat peripheral arterial dopplers to be repeated to follow.    COVID  screen The patient does not have any symptoms that suggest any further testing/ screening at this time.  Social distancing reinforced today.  Patient Risk: After full review of this patients clinical status, I feel that they are at moderate risk for cardiac decompensation at this time.  Relevant cardiac medications were reviewed at length with the patient today. The patient does not have concerns regarding their medications at this time.   Recommended follow-up:  6 months  Today, I have spent 16 minutes with the patient with telehealth technology discussing the above issues .    Signed, Marca Ancona, MD  07/31/2018 5:40 PM  [facility] [address] [facility] [place] [address] [phone] (office) [phone] (fax)

## 2018-08-01 ENCOUNTER — Other Ambulatory Visit (HOSPITAL_COMMUNITY): Payer: No Typology Code available for payment source

## 2018-08-02 ENCOUNTER — Telehealth (INDEPENDENT_AMBULATORY_CARE_PROVIDER_SITE_OTHER): Payer: Self-pay | Admitting: Orthopaedic Surgery

## 2018-08-02 NOTE — Telephone Encounter (Signed)
Patient calling in reference to knee pain. He's a cardiac patient and is unable to take ansaids. Having to stand and walk a lot.  Would like to know the status of elective surgeries and his case.

## 2018-08-08 MED FILL — METOPROLOL SUCCINATE ER 25: 25 | 30 days supply | Qty: 90 | Fill #0

## 2018-08-09 ENCOUNTER — Other Ambulatory Visit: Payer: Self-pay | Admitting: Orthopaedic Surgery

## 2018-08-09 ENCOUNTER — Encounter: Payer: Self-pay | Admitting: Orthopaedic Surgery

## 2018-08-09 MED ORDER — HYDROCODONE-ACETAMINOPHEN 5-325 MG PO TABS: 1.0000 | ORAL_TABLET | Freq: Four times a day (QID) | ORAL | 0 refills | Status: DC | PRN

## 2018-08-09 MED FILL — HYDROCODON-APAP 5-325: 5-325 | 8 days supply | Qty: 30 | Fill #0

## 2018-08-09 NOTE — Progress Notes (Unsigned)
norco

## 2018-08-13 ENCOUNTER — Other Ambulatory Visit: Payer: Self-pay

## 2018-08-13 ENCOUNTER — Ambulatory Visit (INDEPENDENT_AMBULATORY_CARE_PROVIDER_SITE_OTHER): Payer: No Typology Code available for payment source | Admitting: Orthopaedic Surgery

## 2018-08-13 ENCOUNTER — Encounter: Payer: Self-pay | Admitting: Orthopaedic Surgery

## 2018-08-13 VITALS — Ht 74.0 in | Wt 230.0 lb

## 2018-08-13 DIAGNOSIS — S83281A Other tear of lateral meniscus, current injury, right knee, initial encounter: Secondary | ICD-10-CM

## 2018-08-13 NOTE — Progress Notes (Signed)
HPI: [name] returns today due to his right knee pain.  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  He is awaiting surgery has been postponed due to the COVID-19 pandemic.  He did get the go ahead Dr. Shirlee Latch to hold his Xarelto for 2 days prior to surgery.  He states now that his knee pain is so bad at the end of his ER shift he can barely walk.  Has been using ibuprofen and ice without any relief.  Is requesting repeat injection in the right knee.  Last injection was given in March.  Review of systems: No fevers or chills.  Physical exam: Right knee good range of motion.  Slight effusion.  He has tenderness over the lateral compartment.  No abnormal warmth or erythema.  Impression: Right knee lateral meniscal tear  Plan: Knees prepped with Betadine ethyl chloride).  Lateral approach 3 cc of lidocaine was injected.  Then 10 cc of blood-tinged aspirate is obtained patient tolerates well.  We will set him up for his right knee arthroscopy partial lateral meniscectomy.  We will see him back 1 week postop.  Questions encouraged and answered.

## 2018-08-14 NOTE — Telephone Encounter (Signed)
Spoke with patient in office yesterday and scheduled.

## 2018-08-30 MED FILL — SULFAMETHOXAZOLE-TMP DS TAB: 800-160 | 10 days supply | Qty: 20 | Fill #0

## 2018-09-10 ENCOUNTER — Other Ambulatory Visit: Payer: Self-pay | Admitting: Physician Assistant

## 2018-09-10 ENCOUNTER — Encounter (HOSPITAL_COMMUNITY): Payer: Self-pay

## 2018-09-12 ENCOUNTER — Other Ambulatory Visit: Payer: Self-pay

## 2018-09-14 ENCOUNTER — Other Ambulatory Visit (HOSPITAL_COMMUNITY): Payer: Self-pay

## 2018-09-14 MED ORDER — METOPROLOL SUCCINATE ER 25 MG PO TB24: ORAL_TABLET | ORAL | 3 refills | Status: DC

## 2018-09-14 MED FILL — METOPROLOL SUCCINATE ER 25: 25 | 30 days supply | Qty: 90 | Fill #1

## 2018-09-21 ENCOUNTER — Encounter (HOSPITAL_BASED_OUTPATIENT_CLINIC_OR_DEPARTMENT_OTHER): Payer: Self-pay | Admitting: *Deleted

## 2018-09-21 ENCOUNTER — Other Ambulatory Visit: Payer: Self-pay

## 2018-09-24 ENCOUNTER — Other Ambulatory Visit (HOSPITAL_COMMUNITY)
Admission: RE | Admit: 2018-09-24 | Discharge: 2018-09-24 | Disposition: A | Discharge status: Home / Self Care | Payer: No Typology Code available for payment source | Source: Ambulatory Visit | Attending: Orthopaedic Surgery | Admitting: Orthopaedic Surgery

## 2018-09-24 DIAGNOSIS — Z1159 Encounter for screening for other viral diseases: Secondary | ICD-10-CM | POA: Insufficient documentation

## 2018-09-24 LAB — SARS CORONAVIRUS 2 (TAT 6-24 HRS): SARS Coronavirus 2: NEGATIVE

## 2018-09-25 ENCOUNTER — Other Ambulatory Visit: Payer: Self-pay

## 2018-09-25 ENCOUNTER — Encounter: Payer: Self-pay | Admitting: Family Medicine

## 2018-09-25 ENCOUNTER — Ambulatory Visit (INDEPENDENT_AMBULATORY_CARE_PROVIDER_SITE_OTHER): Payer: No Typology Code available for payment source | Admitting: Family Medicine

## 2018-09-25 VITALS — BP 110/70 | HR 90 | Temp 98.3°F | Ht 74.0 in | Wt 229.0 lb

## 2018-09-25 DIAGNOSIS — E119 Type 2 diabetes mellitus without complications: Secondary | ICD-10-CM | POA: Diagnosis not present

## 2018-09-25 DIAGNOSIS — I2510 Atherosclerotic heart disease of native coronary artery without angina pectoris: Secondary | ICD-10-CM | POA: Diagnosis not present

## 2018-09-25 DIAGNOSIS — E7800 Pure hypercholesterolemia, unspecified: Secondary | ICD-10-CM

## 2018-09-25 DIAGNOSIS — Z0000 Encounter for general adult medical examination without abnormal findings: Secondary | ICD-10-CM

## 2018-09-25 DIAGNOSIS — E291 Testicular hypofunction: Secondary | ICD-10-CM

## 2018-09-25 DIAGNOSIS — I10 Essential (primary) hypertension: Secondary | ICD-10-CM

## 2018-09-25 DIAGNOSIS — Z125 Encounter for screening for malignant neoplasm of prostate: Secondary | ICD-10-CM

## 2018-09-25 DIAGNOSIS — Z0001 Encounter for general adult medical examination with abnormal findings: Secondary | ICD-10-CM | POA: Diagnosis not present
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
## 2018-09-26 ENCOUNTER — Encounter (HOSPITAL_BASED_OUTPATIENT_CLINIC_OR_DEPARTMENT_OTHER): Payer: Self-pay | Admitting: Anesthesiology

## 2018-09-26 ENCOUNTER — Telehealth: Payer: Self-pay | Admitting: Family Medicine

## 2018-09-26 LAB — COMPLETE METABOLIC PANEL WITH GFR
AG Ratio: 1.7 (calc) (ref 1.0–2.5)
ALT: 22 U/L (ref 9–46)
AST: 23 U/L (ref 10–35)
Albumin: 4.2 g/dL (ref 3.6–5.1)
Alkaline phosphatase (APISO): 52 U/L (ref 35–144)
BUN: 12 mg/dL (ref 7–25)
CO2: 22 mmol/L (ref 20–32)
Calcium: 9.5 mg/dL (ref 8.6–10.3)
Chloride: 102 mmol/L (ref 98–110)
Creat: 1.02 mg/dL (ref 0.70–1.33)
GFR, Est African American: 93 mL/min/{1.73_m2} (ref >=60)
GFR, Est Non African American: 80 mL/min/{1.73_m2} (ref >=60)
Globulin: 2.5 g/dL (calc) (ref 1.9–3.7)
Glucose, Bld: 123 mg/dL — ABNORMAL HIGH (ref 65–99)
Potassium: 4.1 mmol/L (ref 3.5–5.3)
Sodium: 136 mmol/L (ref 135–146)
Total Bilirubin: 1.1 mg/dL (ref 0.2–1.2)
Total Protein: 6.7 g/dL (ref 6.1–8.1)

## 2018-09-26 LAB — TESTOSTERONE TOTAL,FREE,BIO, MALES
Albumin: 4.2 g/dL (ref 3.6–5.1)
Sex Hormone Binding: 18 nmol/L — ABNORMAL LOW (ref 22–77)
Testosterone, Bioavailable: 98.6 ng/dL — ABNORMAL LOW (ref >=110.0)
Testosterone, Free: 51.2 pg/mL (ref 46.0–224.0)
Testosterone: 255 ng/dL (ref 250–827)

## 2018-09-26 LAB — CBC WITH DIFFERENTIAL/PLATELET
Absolute Monocytes: 794 cells/uL (ref 200–950)
Basophils Absolute: 38 cells/uL (ref 0–200)
Basophils Relative: 0.7 %
Eosinophils Absolute: 221 cells/uL (ref 15–500)
Eosinophils Relative: 4.1 %
HCT: 42.9 % (ref 38.5–50.0)
Hemoglobin: 13.9 g/dL (ref 13.2–17.1)
Lymphs Abs: 1123 cells/uL (ref 850–3900)
MCH: 25.1 pg — ABNORMAL LOW (ref 27.0–33.0)
MCHC: 32.4 g/dL (ref 32.0–36.0)
MCV: 77.6 fL — ABNORMAL LOW (ref 80.0–100.0)
MPV: 9.5 fL (ref 7.5–12.5)
Monocytes Relative: 14.7 %
Neutro Abs: 3224 cells/uL (ref 1500–7800)
Neutrophils Relative %: 59.7 %
Platelets: 275 10*3/uL (ref 140–400)
RBC: 5.53 10*6/uL (ref 4.20–5.80)
RDW: 15.6 % — ABNORMAL HIGH (ref 11.0–15.0)
Total Lymphocyte: 20.8 %
WBC: 5.4 10*3/uL (ref 3.8–10.8)

## 2018-09-26 LAB — LIPID PANEL
Cholesterol: 138 mg/dL (ref <200)
HDL: 46 mg/dL (ref >=40)
LDL Cholesterol (Calc): 63 mg/dL (calc)
Non-HDL Cholesterol (Calc): 92 mg/dL (calc) (ref <130)
Total CHOL/HDL Ratio: 3 (calc) (ref <5.0)
Triglycerides: 220 mg/dL — ABNORMAL HIGH (ref <150)

## 2018-09-26 LAB — PSA: PSA: 0.3 ng/mL (ref <=4.0)

## 2018-09-26 LAB — HEMOGLOBIN A1C
Hgb A1c MFr Bld: 6.4 % of total Hgb — ABNORMAL HIGH (ref <5.7)
Mean Plasma Glucose: 137 (calc)
eAG (mmol/L): 7.6 (calc)

## 2018-09-26 LAB — MICROALBUMIN, URINE: Microalb, Ur: 0.7 mg/dL

## 2018-09-26 NOTE — Anesthesia Preprocedure Evaluation (Addendum)
Anesthesia Evaluation  Patient identified by MRN, date of birth, ID band Patient awake    Reviewed: Allergy & Precautions, NPO status , Patient's Chart, lab work & pertinent test results, reviewed documented beta blocker date and time   Airway Mallampati: II  TM Distance: >3 FB Neck ROM: Full    Dental no notable dental hx. (+) Teeth Intact   Pulmonary former smoker,    Pulmonary exam normal breath sounds clear to auscultation       Cardiovascular hypertension, Pt. on medications and Pt. on home beta blockers + CAD, + Past MI, + Cardiac Stents, + Peripheral Vascular Disease and +CHF  Normal cardiovascular exam Rhythm:Regular Rate:Normal  NSTEMI 08/2011, BMS to distal RCA Inf STEMI 03/2013 BMS to Mid RCA Unstable angina 03/2016 DES to mid LAD  Echo 03/30/2017 Left ventricle: The cavity size was normal. Systolic function was normal. The estimated ejection fraction was in the range of 55% to 60%. Wall motion was normal; there were no regional wall motion abnormalities. Left ventricular diastolic function parameters were normal. - Aortic valve: There was mild regurgitation. - Left atrium: The atrium was moderately dilated.  Impressions:  - Previously described inferolateral hypokinesis is not seen on the  current study.  Cardiac Cath 03/30/2016 1. Severe LAD stenosis, treated successfully with PCI using a long drug-eluting stent platform 2. Continued patency of the stented segments in the right coronary artery 3. Severe stenosis of the distal left circumflex very small area of myocardium, appropriate for medical therapy 4. Normal LV systolic function  Peripheral dopplers 11/2016 75-99% mid left femoral artery   Neuro/Psych negative neurological ROS  negative psych ROS   GI/Hepatic Neg liver ROS, GERD  Medicated and Controlled,  Endo/Other  diabetes, Well Controlled, Type 2, Oral Hypoglycemic AgentsHyperlipidemia   Renal/GU negative Renal ROS   ED    Musculoskeletal Lateral meniscus tear right knee   Abdominal   Peds  Hematology  (+) anemia , Xarelto therapy- last dose 09/21/2018   Anesthesia Other Findings   Reproductive/Obstetrics                          Anesthesia Physical Anesthesia Plan  ASA: III  Anesthesia Plan: General   Post-op Pain Management:    Induction: Intravenous  PONV Risk Score and Plan: 3 and Ondansetron, Treatment may vary due to age or medical condition and Promethazine  Airway Management Planned: LMA  Additional Equipment:   Intra-op Plan:   Post-operative Plan: Extubation in OR  Informed Consent: I have reviewed the patients History and Physical, chart, labs and discussed the procedure including the risks, benefits and alternatives for the proposed anesthesia with the patient or authorized representative who has indicated his/her understanding and acceptance.     Dental advisory given  Plan Discussed with: CRNA and Surgeon  Anesthesia Plan Comments:        Anesthesia Quick Evaluation

## 2018-09-26 NOTE — Telephone Encounter (Signed)
Received fmla ppw via fax on [phone] Will route to sandy/dr pickard

## 2018-09-27 ENCOUNTER — Ambulatory Visit (HOSPITAL_BASED_OUTPATIENT_CLINIC_OR_DEPARTMENT_OTHER)
Admission: RE | Admit: 2018-09-27 | Discharge: 2018-09-27 | Disposition: A | Discharge status: Home / Self Care | Payer: No Typology Code available for payment source | Attending: Orthopaedic Surgery | Admitting: Orthopaedic Surgery

## 2018-09-27 ENCOUNTER — Encounter: Payer: Self-pay | Admitting: Orthopaedic Surgery

## 2018-09-27 ENCOUNTER — Other Ambulatory Visit: Payer: Self-pay

## 2018-09-27 ENCOUNTER — Encounter (HOSPITAL_BASED_OUTPATIENT_CLINIC_OR_DEPARTMENT_OTHER): Payer: Self-pay

## 2018-09-27 ENCOUNTER — Telehealth: Payer: Self-pay | Admitting: *Deleted

## 2018-09-27 ENCOUNTER — Encounter (HOSPITAL_BASED_OUTPATIENT_CLINIC_OR_DEPARTMENT_OTHER)
Admission: RE | Disposition: A | Discharge status: Home / Self Care | Payer: Self-pay | Source: Home / Self Care | Attending: Orthopaedic Surgery

## 2018-09-27 ENCOUNTER — Ambulatory Visit (HOSPITAL_BASED_OUTPATIENT_CLINIC_OR_DEPARTMENT_OTHER): Payer: No Typology Code available for payment source | Admitting: Anesthesiology

## 2018-09-27 DIAGNOSIS — S83281A Other tear of lateral meniscus, current injury, right knee, initial encounter: Secondary | ICD-10-CM | POA: Diagnosis present

## 2018-09-27 DIAGNOSIS — Z8261 Family history of arthritis: Secondary | ICD-10-CM | POA: Diagnosis not present

## 2018-09-27 DIAGNOSIS — S83271A Complex tear of lateral meniscus, current injury, right knee, initial encounter: Secondary | ICD-10-CM | POA: Insufficient documentation

## 2018-09-27 DIAGNOSIS — Z8249 Family history of ischemic heart disease and other diseases of the circulatory system: Secondary | ICD-10-CM | POA: Insufficient documentation

## 2018-09-27 DIAGNOSIS — I110 Hypertensive heart disease with heart failure: Secondary | ICD-10-CM | POA: Diagnosis not present

## 2018-09-27 DIAGNOSIS — K219 Gastro-esophageal reflux disease without esophagitis: Secondary | ICD-10-CM | POA: Diagnosis not present

## 2018-09-27 DIAGNOSIS — X58XXXA Exposure to other specified factors, initial encounter: Secondary | ICD-10-CM | POA: Insufficient documentation

## 2018-09-27 DIAGNOSIS — M94261 Chondromalacia, right knee: Secondary | ICD-10-CM | POA: Diagnosis not present

## 2018-09-27 DIAGNOSIS — Z955 Presence of coronary angioplasty implant and graft: Secondary | ICD-10-CM | POA: Diagnosis not present

## 2018-09-27 DIAGNOSIS — D649 Anemia, unspecified: Secondary | ICD-10-CM | POA: Diagnosis not present

## 2018-09-27 DIAGNOSIS — I509 Heart failure, unspecified: Secondary | ICD-10-CM | POA: Insufficient documentation

## 2018-09-27 DIAGNOSIS — Z79899 Other long term (current) drug therapy: Secondary | ICD-10-CM | POA: Diagnosis not present

## 2018-09-27 DIAGNOSIS — Z833 Family history of diabetes mellitus: Secondary | ICD-10-CM | POA: Diagnosis not present

## 2018-09-27 DIAGNOSIS — I2510 Atherosclerotic heart disease of native coronary artery without angina pectoris: Secondary | ICD-10-CM | POA: Insufficient documentation

## 2018-09-27 DIAGNOSIS — I351 Nonrheumatic aortic (valve) insufficiency: Secondary | ICD-10-CM | POA: Insufficient documentation

## 2018-09-27 DIAGNOSIS — Z7901 Long term (current) use of anticoagulants: Secondary | ICD-10-CM | POA: Diagnosis not present

## 2018-09-27 DIAGNOSIS — E119 Type 2 diabetes mellitus without complications: Secondary | ICD-10-CM | POA: Diagnosis not present

## 2018-09-27 DIAGNOSIS — M1711 Unilateral primary osteoarthritis, right knee: Secondary | ICD-10-CM | POA: Insufficient documentation

## 2018-09-27 DIAGNOSIS — E785 Hyperlipidemia, unspecified: Secondary | ICD-10-CM | POA: Diagnosis not present

## 2018-09-27 DIAGNOSIS — I252 Old myocardial infarction: Secondary | ICD-10-CM | POA: Insufficient documentation

## 2018-09-27 DIAGNOSIS — Z87891 Personal history of nicotine dependence: Secondary | ICD-10-CM | POA: Insufficient documentation

## 2018-09-27 DIAGNOSIS — Z888 Allergy status to other drugs, medicaments and biological substances status: Secondary | ICD-10-CM | POA: Diagnosis not present

## 2018-09-27 DIAGNOSIS — Z7984 Long term (current) use of oral hypoglycemic drugs: Secondary | ICD-10-CM | POA: Diagnosis not present

## 2018-09-27 DIAGNOSIS — Z818 Family history of other mental and behavioral disorders: Secondary | ICD-10-CM | POA: Insufficient documentation

## 2018-09-27 DIAGNOSIS — I739 Peripheral vascular disease, unspecified: Secondary | ICD-10-CM | POA: Insufficient documentation

## 2018-09-27 HISTORY — PX: KNEE ARTHROSCOPY WITH LATERAL MENISECTOMY: SHX6193

## 2018-09-27 LAB — GLUCOSE, CAPILLARY
Glucose-Capillary: 146 mg/dL — ABNORMAL HIGH (ref 70–99)
Glucose-Capillary: 132 mg/dL — ABNORMAL HIGH (ref 70–99)

## 2018-09-27 SURGERY — ARTHROSCOPY, KNEE, WITH LATERAL MENISCECTOMY
Anesthesia: General | Site: Knee | Laterality: Right

## 2018-09-27 MED ORDER — HYDROCODONE-ACETAMINOPHEN 7.5-325 MG PO TABS
1.0000 | ORAL_TABLET | Freq: Once | ORAL | Status: AC | PRN
  Administered 2018-09-27: 1 via ORAL

## 2018-09-27 MED ORDER — SODIUM CHLORIDE 0.9 % IR SOLN
Status: DC | PRN
  Administered 2018-09-27: 3000 mL

## 2018-09-27 MED ORDER — CEFAZOLIN SODIUM-DEXTROSE 2-4 GM/100ML-% IV SOLN
INTRAVENOUS | Status: AC
  Filled 2018-09-27: qty 100

## 2018-09-27 MED ORDER — DEXAMETHASONE SODIUM PHOSPHATE 10 MG/ML IJ SOLN
INTRAMUSCULAR | Status: AC
  Filled 2018-09-27: qty 1

## 2018-09-27 MED ORDER — BUPIVACAINE HCL (PF) 0.25 % IJ SOLN
INTRAMUSCULAR | Status: DC | PRN
  Administered 2018-09-27: 20 mL via INTRA_ARTICULAR

## 2018-09-27 MED ORDER — LIDOCAINE HCL (CARDIAC) PF 100 MG/5ML IV SOSY
PREFILLED_SYRINGE | INTRAVENOUS | Status: DC | PRN
  Administered 2018-09-27: 60 mg via INTRAVENOUS

## 2018-09-27 MED ORDER — FENTANYL CITRATE (PF) 100 MCG/2ML IJ SOLN
INTRAMUSCULAR | Status: AC
  Filled 2018-09-27: qty 2

## 2018-09-27 MED ORDER — CHLORHEXIDINE GLUCONATE 4 % EX LIQD: 60.0000 mL | Freq: Once | CUTANEOUS | Status: DC

## 2018-09-27 MED ORDER — HYDROMORPHONE HCL 1 MG/ML IJ SOLN: 0.2500 mg | INTRAMUSCULAR | Status: DC | PRN

## 2018-09-27 MED ORDER — LIDOCAINE HCL (PF) 1 % IJ SOLN
INTRAMUSCULAR | Status: AC
  Filled 2018-09-27: qty 30

## 2018-09-27 MED ORDER — ONDANSETRON HCL 4 MG/2ML IJ SOLN
INTRAMUSCULAR | Status: DC | PRN
  Administered 2018-09-27: 4 mg via INTRAVENOUS

## 2018-09-27 MED ORDER — MIDAZOLAM HCL 2 MG/2ML IJ SOLN
INTRAMUSCULAR | Status: AC
  Filled 2018-09-27: qty 2

## 2018-09-27 MED ORDER — ONDANSETRON HCL 4 MG/2ML IJ SOLN
INTRAMUSCULAR | Status: AC
  Filled 2018-09-27: qty 2

## 2018-09-27 MED ORDER — LIDOCAINE 2% (20 MG/ML) 5 ML SYRINGE
INTRAMUSCULAR | Status: AC
  Filled 2018-09-27: qty 5

## 2018-09-27 MED ORDER — BUPIVACAINE HCL (PF) 0.5 % IJ SOLN
INTRAMUSCULAR | Status: AC
  Filled 2018-09-27: qty 30

## 2018-09-27 MED ORDER — MORPHINE SULFATE (PF) 4 MG/ML IV SOLN
INTRAVENOUS | Status: AC
  Filled 2018-09-27: qty 1

## 2018-09-27 MED ORDER — MORPHINE SULFATE (PF) 4 MG/ML IV SOLN
INTRAVENOUS | Status: DC | PRN
  Administered 2018-09-27: 4 mg

## 2018-09-27 MED ORDER — PROPOFOL 500 MG/50ML IV EMUL
INTRAVENOUS | Status: AC
  Filled 2018-09-27: qty 50

## 2018-09-27 MED ORDER — HYDROCODONE-ACETAMINOPHEN 5-325 MG PO TABS
ORAL_TABLET | ORAL | Status: AC
  Filled 2018-09-27: qty 1

## 2018-09-27 MED ORDER — PROPOFOL 10 MG/ML IV BOLUS
INTRAVENOUS | Status: DC | PRN
  Administered 2018-09-27: 180 mg via INTRAVENOUS

## 2018-09-27 MED ORDER — BUPIVACAINE HCL (PF) 0.25 % IJ SOLN
INTRAMUSCULAR | Status: AC
  Filled 2018-09-27: qty 30

## 2018-09-27 MED ORDER — SCOPOLAMINE 1 MG/3DAYS TD PT72: 1.0000 | MEDICATED_PATCH | Freq: Once | TRANSDERMAL | Status: DC

## 2018-09-27 MED ORDER — ONDANSETRON HCL 4 MG/2ML IJ SOLN: 4.0000 mg | Freq: Once | INTRAMUSCULAR | Status: DC | PRN

## 2018-09-27 MED ORDER — DEXAMETHASONE SODIUM PHOSPHATE 4 MG/ML IJ SOLN
INTRAMUSCULAR | Status: DC | PRN
  Administered 2018-09-27: 5 mg via INTRAVENOUS

## 2018-09-27 MED ORDER — CEFAZOLIN SODIUM-DEXTROSE 2-4 GM/100ML-% IV SOLN
2.0000 g | INTRAVENOUS | Status: AC
Start: 2018-09-27 — End: 2018-09-27
  Administered 2018-09-27: 2 g via INTRAVENOUS

## 2018-09-27 MED ORDER — HYDROCODONE-ACETAMINOPHEN 5-325 MG PO TABS: 1.0000 | ORAL_TABLET | Freq: Four times a day (QID) | ORAL | 0 refills | Status: DC | PRN

## 2018-09-27 MED ORDER — MIDAZOLAM HCL 2 MG/2ML IJ SOLN
1.0000 mg | INTRAMUSCULAR | Status: DC | PRN
  Administered 2018-09-27: 2 mg via INTRAVENOUS

## 2018-09-27 MED ORDER — FENTANYL CITRATE (PF) 100 MCG/2ML IJ SOLN
50.0000 ug | INTRAMUSCULAR | Status: DC | PRN
  Administered 2018-09-27: 100 ug via INTRAVENOUS

## 2018-09-27 MED ORDER — LACTATED RINGERS IV SOLN
INTRAVENOUS | Status: DC
  Administered 2018-09-27: 07:00:00 via INTRAVENOUS

## 2018-09-27 MED FILL — HYDROCODON-APAP 5-325: 5-325 | 5 days supply | Qty: 40 | Fill #0

## 2018-09-27 SURGICAL SUPPLY — 40 items
BANDAGE ACE 6X5 VEL STRL LF (GAUZE/BANDAGES/DRESSINGS) ×3 IMPLANT
BLADE CUDA SHAVER 3.5 (BLADE) IMPLANT
BLADE EXCALIBUR 4.0X13 (MISCELLANEOUS) IMPLANT
COVER WAND RF STERILE (DRAPES) IMPLANT
DISSECTOR  3.8MM X 13CM (MISCELLANEOUS) ×1
DISSECTOR 3.8MM X 13CM (MISCELLANEOUS) IMPLANT
DRAPE ARTHROSCOPY W/POUCH 90 (DRAPES) ×3 IMPLANT
DRAPE U-SHAPE 47X51 STRL (DRAPES) ×3 IMPLANT
DRSG PAD ABDOMINAL 8X10 ST (GAUZE/BANDAGES/DRESSINGS) ×3 IMPLANT
DURAPREP 26ML APPLICATOR (WOUND CARE) ×3 IMPLANT
ELECT MENISCUS 165MM 90D (ELECTRODE) IMPLANT
ELECT REM PT RETURN 9FT ADLT (ELECTROSURGICAL) ×3
ELECTRODE REM PT RTRN 9FT ADLT (ELECTROSURGICAL) IMPLANT
EXCALIBUR 3.8MM X 13CM (MISCELLANEOUS) IMPLANT
GAUZE SPONGE 4X4 12PLY STRL (GAUZE/BANDAGES/DRESSINGS) ×3 IMPLANT
GAUZE XEROFORM 1X8 LF (GAUZE/BANDAGES/DRESSINGS) ×3 IMPLANT
GLOVE BIO SURGEON STRL SZ7 (GLOVE) ×1 IMPLANT
GLOVE BIOGEL PI IND STRL 7.0 (GLOVE) IMPLANT
GLOVE BIOGEL PI IND STRL 7.5 (GLOVE) IMPLANT
GLOVE BIOGEL PI IND STRL 8 (GLOVE) ×4 IMPLANT
GLOVE BIOGEL PI INDICATOR 7.0 (GLOVE) ×1
GLOVE BIOGEL PI INDICATOR 7.5 (GLOVE) ×1
GLOVE BIOGEL PI INDICATOR 8 (GLOVE) ×2
GLOVE ORTHO TXT STRL SZ7.5 (GLOVE) ×3 IMPLANT
GLOVE SURG ORTHO 8.0 STRL STRW (GLOVE) ×3 IMPLANT
GOWN STRL REUS W/ TWL LRG LVL3 (GOWN DISPOSABLE) ×4 IMPLANT
GOWN STRL REUS W/ TWL XL LVL3 (GOWN DISPOSABLE) ×2 IMPLANT
GOWN STRL REUS W/TWL LRG LVL3 (GOWN DISPOSABLE) ×2
GOWN STRL REUS W/TWL XL LVL3 (GOWN DISPOSABLE) ×1
KNEE WRAP E Z 3 GEL PACK (MISCELLANEOUS) ×3 IMPLANT
MANIFOLD NEPTUNE II (INSTRUMENTS) ×1 IMPLANT
PACK ARTHROSCOPY DSU (CUSTOM PROCEDURE TRAY) ×3 IMPLANT
PACK BASIN DAY SURGERY FS (CUSTOM PROCEDURE TRAY) ×3 IMPLANT
PADDING CAST COTTON 6X4 STRL (CAST SUPPLIES) ×3 IMPLANT
PENCIL BUTTON HOLSTER BLD 10FT (ELECTRODE) IMPLANT
SUT ETHILON 3 0 PS 1 (SUTURE) ×3 IMPLANT
SYR 3ML 18GX1 1/2 (SYRINGE) ×1 IMPLANT
TOWEL GREEN STERILE FF (TOWEL DISPOSABLE) ×3 IMPLANT
TUBING ARTHROSCOPY IRRIG 16FT (MISCELLANEOUS) ×3 IMPLANT
WATER STERILE IRR 1000ML POUR (IV SOLUTION) ×3 IMPLANT

## 2018-09-27 NOTE — Brief Op Note (Signed)
09/27/2018  8:27 AM  PATIENT:  Derrick Todd  59 y.o. male  PRE-OPERATIVE DIAGNOSIS:  right knee lateral meniscal tear  POST-OPERATIVE DIAGNOSIS:  right knee lateral meniscal tear  PROCEDURE:  Procedure(s): RIGHT KNEE ARTHROSCOPY WITH PARTIAL LATERAL MENISCECTOMY (Right)  SURGEON:  Surgeon(s) and Role:    Mcarthur Rossetti, MD - Primary  PHYSICIAN ASSISTANT: Benita Stabile, PA-C  ANESTHESIA:   local and general  COUNTS:  YES  DICTATION: .Other Dictation: Dictation Number [id number]  PLAN OF CARE: Discharge to home after PACU  PATIENT DISPOSITION:  PACU - hemodynamically stable.   Delay start of Pharmacological VTE agent (>24hrs) due to surgical blood loss or risk of bleeding: no

## 2018-09-27 NOTE — Transfer of Care (Signed)
Immediate Anesthesia Transfer of Care Note  Patient: Derrick Todd  Procedure(s) Performed: RIGHT KNEE ARTHROSCOPY WITH PARTIAL LATERAL MENISCECTOMY (Right Knee)  Patient Location: PACU  Anesthesia Type:General  Level of Consciousness: sedated  Airway & Oxygen Therapy: Patient Spontanous Breathing and Patient connected to nasal cannula oxygen  Post-op Assessment: Report given to RN and Post -op Vital signs reviewed and stable  Post vital signs: Reviewed and stable  Last Vitals:  Vitals Value Taken Time  BP 135/90 09/27/18 0830  Temp    Pulse 87 09/27/18 0833  Resp 11 09/27/18 0833  SpO2 93 % 09/27/18 0833  Vitals shown include unvalidated device data.  Last Pain:  Vitals:   09/27/18 0735  TempSrc: Oral  PainSc: 2          Complications: No apparent anesthesia complications

## 2018-09-27 NOTE — Anesthesia Postprocedure Evaluation (Signed)
Anesthesia Post Note  Patient: Derrick Todd  Procedure(s) Performed: RIGHT KNEE ARTHROSCOPY WITH PARTIAL LATERAL MENISCECTOMY (Right Knee)     Patient location during evaluation: PACU Anesthesia Type: General Level of consciousness: awake and alert and oriented Pain management: pain level controlled Vital Signs Assessment: post-procedure vital signs reviewed and stable Respiratory status: spontaneous breathing, nonlabored ventilation and respiratory function stable Cardiovascular status: blood pressure returned to baseline and stable Postop Assessment: no apparent nausea or vomiting Anesthetic complications: no    Last Vitals:  Vitals:   09/27/18 0845 09/27/18 0850  BP: (!) 145/88   Pulse: 84 81  Resp: 13 15  Temp:    SpO2: 95%     Last Pain:  Vitals:   09/27/18 0845  TempSrc:   PainSc: 3                  Ercia Crisafulli A.

## 2018-09-27 NOTE — Op Note (Signed)
Derrick Todd, Derrick Todd MEDICAL RECORD [id number] ACCOUNT [id number] DATE OF BIRTH:June 23, 1960 FACILITY: [facility] LOCATION: [facility] PHYSICIAN:Harsh Trulock Aretha Parrot, MD  OPERATIVE REPORT  DATE OF PROCEDURE:  09/27/2018  PREOPERATIVE DIAGNOSIS:  Right knee complex lateral meniscal tear.  POSTOPERATIVE DIAGNOSES: 1.  Right knee complex lateral meniscal tear. 2.  Tricompartmental arthritic changes with chondromalacia grade IV of the lateral compartment of the knee and the trochlear groove and grade III of the medial compartment of the knee.  PROCEDURE:  Right knee arthroscopy with partial lateral meniscectomy and debridement.  SURGEON:  Vanita Panda. Magnus Ivan, MD  ASSISTANT:  Richardean Canal, PA-C  ANESTHESIA: 1.  General. 2.  Local with a mixture of plain Marcaine and morphine.  ESTIMATED BLOOD LOSS:  Minimal.  COMPLICATIONS:  None.  INDICATIONS:  The patient is a 58 year old gentleman well known to me.  He is a tall individual.  He has been dealing with right knee problems with swelling, locking and catching for some time now.  We have tried conservative treatment for him including  anti-inflammatories and ice as well as steroid injections.  He has worked on Dance movement psychotherapist exercises and activity modification.  An MRI was eventually obtained of his knee.  It did show a large complex flap tear of the lateral meniscus of his right  knee.  He continued to remain symptomatic.  His MRI did show arthritic changes in the lateral compartment and throughout the knee, but he felt it was worth trying an arthroscopic intervention as well given his mechanical symptoms of locking and catching  and MRI findings of the complex lateral meniscal tear.  The risks and benefits were explained to him in detail, and he does wish to proceed.  DESCRIPTION OF PROCEDURE:  After informed consent was obtained and appropriate right knee was marked, he was brought to the operating room and placed supine on the  operating table.  General anesthesia was then obtained.  His right thigh, knee, leg,  ankle, foot were prepped and draped with DuraPrep and sterile drapes, including a sterile stockinette.  A lateral leg post was utilized.  The bed was raised, and the right operative knee was flexed off the side of the table.  He was identified as the  correct patient and the correct right knee.  We then made an anterior lateral arthroscopy portal, placing the camera in the knee.  I went to the medial compartment and made an anterior medial incision.  I placed a probe in the knee, and right away you  could see there were cartilage changes of the medial femoral condyle for grade III.  The medial meniscus itself was intact.  The ACL and PCL were intact.  With the knee in a figure-of-four position, there was a complex tear of the lateral meniscus as  well as grade IV chondromalacia of the lateral femoral condyle.  This was also found at the trochlear groove.  I placed an arthroscopic shaver in the knee and then performed a partial lateral meniscectomy, debriding this back to a stable margin.  I  performed a minimal chondroplasty of all 3 compartments as well.  This was only minimal.  I then removed some of the inflamed fat pad of the knee as well.  I then allowed fluid lavage of the knee and then drained all fluid from the knee.  We removed all  instrumentation.  The portal sites were closed with nylon suture.  We inserted our morphine and Marcaine into the knee and the portal sites as well.  A well-padded sterile dressing was applied.  He was awakened, extubated, and taken to recovery room in  stable condition.  All final counts were correct.  There were no complications noted.  Postoperatively, he will be discharged home from the PACU.  He can weight bear as tolerated and increase his activity as comfort allows.  Discharge instructions were  given.  He will be followed up in the office in a week.  LN/NUANCE  D:09/27/2018  T:09/27/2018 JOB:[id number]

## 2018-09-27 NOTE — Discharge Instructions (Signed)
Increase your activities as comfort allows. You may put all of your weight on you right leg. Expect right knee swelling - ice and elevation several times daily. Do pump your feet occasionally through out the day. Bend your knee as comfort allows. You can remove your dressings in 24 hours and get your incisions wet in the shower daily. Place small band-aids over your incisions daily after you shower.  *No hydrocodone before 3:15pm  Post Anesthesia Home Care Instructions  Activity: Get plenty of rest for the remainder of the day. A responsible individual must stay with you for 24 hours following the procedure.  For the next 24 hours, DO NOT: -Drive a car -Paediatric nurse -Drink alcoholic beverages -Take any medication unless instructed by your physician -Make any legal decisions or sign important papers.  Meals: Start with liquid foods such as gelatin or soup. Progress to regular foods as tolerated. Avoid greasy, spicy, heavy foods. If nausea and/or vomiting occur, drink only clear liquids until the nausea and/or vomiting subsides. Call your physician if vomiting continues.  Special Instructions/Symptoms: Your throat may feel dry or sore from the anesthesia or the breathing tube placed in your throat during surgery. If this causes discomfort, gargle with warm salt water. The discomfort should disappear within 24 hours.  If you had a scopolamine patch placed behind your ear for the management of post- operative nausea and/or vomiting:  1. The medication in the patch is effective for 72 hours, after which it should be removed.  Wrap patch in a tissue and discard in the trash. Wash hands thoroughly with soap and water. 2. You may remove the patch earlier than 72 hours if you experience unpleasant side effects which may include dry mouth, dizziness or visual disturbances. 3. Avoid touching the patch. Wash your hands with soap and water after contact with the patch.
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
## 2018-09-27 NOTE — Telephone Encounter (Signed)
-----   Message from Alyson Locket, [place] sent at 09/25/2018  9:56 AM EDT -----  ----- Message ----- From: [name], MD Sent: 09/25/2018   8:28 AM EDT To: Alyson Locket, RMA  Please schedule for cologuard

## 2018-09-27 NOTE — Telephone Encounter (Signed)
Received verbal orders for Cologuard.   Order placed via [facility].   Cologuard (Order [id number])

## 2018-09-27 NOTE — Telephone Encounter (Signed)
Fmla filled out and faxed to [employer]

## 2018-09-27 NOTE — Anesthesia Procedure Notes (Signed)
Procedure Name: LMA Insertion Date/Time: 09/27/2018 7:51 AM Performed by: Maryella Shivers, CRNA Pre-anesthesia Checklist: Patient identified, Emergency Drugs available, Suction available and Patient being monitored Patient Re-evaluated:Patient Re-evaluated prior to induction Oxygen Delivery Method: Circle system utilized Preoxygenation: Pre-oxygenation with 100% oxygen Induction Type: IV induction Ventilation: Mask ventilation without difficulty LMA: LMA inserted LMA Size: 5.0 Number of attempts: 1 Airway Equipment and Method: Bite block Placement Confirmation: positive ETCO2 Tube secured with: Tape Dental Injury: Teeth and Oropharynx as per pre-operative assessment

## 2018-09-28 ENCOUNTER — Encounter (HOSPITAL_BASED_OUTPATIENT_CLINIC_OR_DEPARTMENT_OTHER): Payer: Self-pay | Admitting: Orthopaedic Surgery

## 2018-10-04 ENCOUNTER — Encounter: Payer: Self-pay | Admitting: Orthopaedic Surgery

## 2018-10-04 ENCOUNTER — Other Ambulatory Visit: Payer: Self-pay

## 2018-10-04 ENCOUNTER — Telehealth: Payer: Self-pay

## 2018-10-04 ENCOUNTER — Ambulatory Visit (INDEPENDENT_AMBULATORY_CARE_PROVIDER_SITE_OTHER): Payer: No Typology Code available for payment source | Admitting: Orthopaedic Surgery

## 2018-10-04 DIAGNOSIS — Z9889 Other specified postprocedural states: Secondary | ICD-10-CM

## 2018-10-04 DIAGNOSIS — M1711 Unilateral primary osteoarthritis, right knee: Secondary | ICD-10-CM

## 2018-10-04 NOTE — Telephone Encounter (Signed)
Right knee gel injection  

## 2018-10-04 NOTE — Progress Notes (Signed)
The patient is one-week status post a right knee arthroscopy.  He is doing well.  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  On exam there is minimal swelling of his right knee.  I did remove the sutures.  There is some slight serous drainage from the medial incision but that does not appear infected.  There is no redness.  There is no pain.  This may be residual arthroscopy fluid.  We will watch this closely.  His range of motion is full in the knee.  His knee is ligamentously stable.  We went over the arthroscopy pictures with him showing the extent of what we did for surgery.  I do feel at some point he may be a candidate for hyaluronic acid given the grade III chondromalacia.  We talked about quad strengthening exercises and increasing his activities as comfort allows.  We will go ahead and order hyaluronic acid for his knee but I would like to put it in about 6 weeks from now.  He agrees with this as well.  All question concerns were answered and addressed.

## 2018-10-06 MED FILL — METOPROLOL SUCCINATE ER 25: 25 | 90 days supply | Qty: 270 | Fill #0

## 2018-10-09 ENCOUNTER — Telehealth: Payer: Self-pay

## 2018-10-09 NOTE — Telephone Encounter (Signed)
Submitted VOB for Monovisc, right knee. 

## 2018-10-09 NOTE — Telephone Encounter (Signed)
Noted  

## 2018-10-12 ENCOUNTER — Telehealth: Payer: Self-pay

## 2018-10-12 ENCOUNTER — Telehealth: Payer: Self-pay | Admitting: Family Medicine

## 2018-10-12 NOTE — Telephone Encounter (Signed)
Needs a note giving him permission to go back to work on Monday 10/15/18

## 2018-10-12 NOTE — Telephone Encounter (Signed)
Note is RX on my desk

## 2018-10-12 NOTE — Telephone Encounter (Signed)
Pt needs [place] to write him a back to work note.   Please call him if you need details he stated he left you a vm with what he needs.

## 2018-10-12 NOTE — Telephone Encounter (Signed)
Patient would like a return to work note. S/P Arthroscopy of Right Knee. 09/27/2018.  Cb# is [phone].  Please advise.  Thank you.

## 2018-10-15 NOTE — Telephone Encounter (Signed)
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

## 2018-10-15 NOTE — Telephone Encounter (Signed)
Patient aware note at front desk  

## 2018-10-15 NOTE — Telephone Encounter (Signed)
Ok to return normal without restrictions?

## 2018-10-15 NOTE — Telephone Encounter (Signed)
Left detailed message for pt to p/u letter. Left up front.

## 2018-10-18 ENCOUNTER — Telehealth: Payer: Self-pay

## 2018-10-18 NOTE — Telephone Encounter (Signed)
Talked with Linus Orn at [facility] to get benefits for Monovisc, right knee.  Per Linus Orn, patient would be covered at 100% if done in the provider's office.  No deductible.  Initiated PA with Linus Orn at [facility] for [employer], right knee. Per Linus Orn, clinical notes needed to be faxed to [phone]. 07/16/20209:15am Faxed clinical notes to [phone].

## 2018-10-26 ENCOUNTER — Telehealth: Payer: Self-pay

## 2018-10-26 NOTE — Telephone Encounter (Signed)
Patient Approved for Monovisc, right knee. Buy & Bill Covered at 100% if done in the provider's office per Linus Orn with [employer]. No Co-pay PA required PA Approval# [id number] Valid 11/15/2018- 12/13/2018  Injection will be done in the office with Dr. Ninfa Linden on Thursday, 11/15/2018.

## 2018-11-01 ENCOUNTER — Other Ambulatory Visit: Payer: Self-pay | Admitting: Family Medicine

## 2018-11-01 ENCOUNTER — Other Ambulatory Visit (HOSPITAL_COMMUNITY): Payer: Self-pay | Admitting: Cardiology

## 2018-11-01 DIAGNOSIS — Z720 Tobacco use: Secondary | ICD-10-CM

## 2018-11-01 DIAGNOSIS — I2510 Atherosclerotic heart disease of native coronary artery without angina pectoris: Secondary | ICD-10-CM

## 2018-11-01 MED FILL — METOPROLOL SUCCINATE ER 25: 25 | 90 days supply | Qty: 270 | Fill #0

## 2018-11-01 MED FILL — PANTOPRAZOLE SOD DR 40 MG T: 40 | 90 days supply | Qty: 90 | Fill #0

## 2018-11-01 MED FILL — XARELTO 2.5 MG TABS: 2.5 | 90 days supply | Qty: 180 | Fill #0

## 2018-11-01 MED FILL — ROSUVASTATIN CALCIUM 40 MG: 40 | 90 days supply | Qty: 90 | Fill #1

## 2018-11-02 MED FILL — glipiZIDE ER 10 MG TB24: 10 | 90 days supply | Qty: 90 | Fill #0

## 2018-11-02 MED FILL — metFORMIN HCL 1000 MG TABS: 1000 | 90 days supply | Qty: 180 | Fill #0

## 2018-11-02 MED FILL — SILDENAFIL CITRATE 50 MG TA: 50 | 75 days supply | Qty: 15 | Fill #0

## 2018-11-02 MED FILL — TESTOSTERONE 20.25 MG/ACT (: 20.25 MG/AC | 60 days supply | Qty: 75 | Fill #0

## 2018-11-02 NOTE — Telephone Encounter (Signed)
Ok to refill 

## 2018-11-08 ENCOUNTER — Other Ambulatory Visit: Payer: Self-pay | Admitting: Cardiology

## 2018-11-15 ENCOUNTER — Ambulatory Visit: Payer: No Typology Code available for payment source | Admitting: Orthopaedic Surgery

## 2018-11-20 MED FILL — FUROSEMIDE 20 MG TABS: 20 | 90 days supply | Qty: 90 | Fill #0

## 2018-11-20 MED FILL — POTASSIUM CHLORIDE CRYS ER: 10 | 30 days supply | Qty: 30 | Fill #0

## 2018-11-22 ENCOUNTER — Encounter: Payer: Self-pay | Admitting: Orthopaedic Surgery

## 2018-11-22 ENCOUNTER — Ambulatory Visit (INDEPENDENT_AMBULATORY_CARE_PROVIDER_SITE_OTHER): Payer: No Typology Code available for payment source | Admitting: Orthopaedic Surgery

## 2018-11-22 DIAGNOSIS — M1711 Unilateral primary osteoarthritis, right knee: Secondary | ICD-10-CM | POA: Diagnosis not present

## 2018-11-22 MED ORDER — HYALURONAN 88 MG/4ML IX SOSY
88.0000 mg | PREFILLED_SYRINGE | INTRA_ARTICULAR | Status: AC | PRN
  Administered 2018-11-22: 88 mg via INTRA_ARTICULAR

## 2018-11-22 NOTE — Progress Notes (Signed)
   Procedure Note  Patient: Derrick Todd             Date of Birth: [birth year]           MRN: [id number]             Visit Date: 11/22/2018  Procedures: Visit Diagnoses:  1. Unilateral primary osteoarthritis, right knee     Large Joint Inj: R knee on 11/22/2018 2:42 PM Indications: diagnostic evaluation and pain Details: 22 G 1.5 in needle, superolateral approach  Arthrogram: No  Medications: 88 mg Hyaluronan 88 MG/4ML Outcome: tolerated well, no immediate complications Procedure, treatment alternatives, risks and benefits explained, specific risks discussed. Consent was given by the patient. Immediately prior to procedure a time out was called to verify the correct patient, procedure, equipment, support staff and site/side marked as required. Patient was prepped and draped in the usual sterile fashion.    The patient is here today for scheduled hyaluronic acid injection with Monovisc in his right knee to treat the pain from osteoarthritis.  We have actually performed arthroscopic surgery on his knee as well.  We feel that this will help him the most for hopefully long-term pain control.  He understands the rationale behind these types of injections and the risk and benefits involved.  All question concerns were answered and addressed.  Examination of his right knee shows no effusion today.  He tolerated the hyaluronic acid injection with Monovisc well.  At this point follow-up will be as needed.  All question concerns were answered and addressed.  He knows that if he has any issues at all he will let us know.

## 2018-12-11 ENCOUNTER — Encounter: Payer: Self-pay | Admitting: Family Medicine

## 2018-12-11 MED ORDER — SITAGLIPTIN PHOSPHATE 100 MG PO TABS: 100.0000 mg | ORAL_TABLET | Freq: Every day | ORAL | 3 refills | Status: DC

## 2018-12-11 MED ORDER — EMPAGLIFLOZIN 25 MG PO TABS: 25.0000 mg | ORAL_TABLET | Freq: Every day | ORAL | 3 refills | Status: DC

## 2018-12-11 MED FILL — JARDIANCE 25 MG TABLET: 25 | 90 days supply | Qty: 90 | Fill #0

## 2018-12-11 MED FILL — JANUVIA 100 MG TABLET: 100 | 90 days supply | Qty: 90 | Fill #0

## 2019-01-11 ENCOUNTER — Other Ambulatory Visit (HOSPITAL_COMMUNITY): Payer: Self-pay | Admitting: Cardiology

## 2019-01-11 MED FILL — LISINOPRIL 5 MG TABS: 5 | 90 days supply | Qty: 90 | Fill #0

## 2019-03-01 MED FILL — POTASSIUM CHLORIDE CRYS ER: 10 | 30 days supply | Qty: 30 | Fill #1

## 2019-03-01 MED FILL — metFORMIN HCL 1000 MG TABS: 1000 | 90 days supply | Qty: 180 | Fill #1

## 2019-03-01 MED FILL — FUROSEMIDE 20 MG TABS: 20 | 90 days supply | Qty: 90 | Fill #1

## 2019-03-01 MED FILL — TESTOSTERONE 20.25 MG/ACT (: 20.25 MG/AC | 60 days supply | Qty: 75 | Fill #1

## 2019-03-01 MED FILL — METOPROLOL SUCCINATE ER 25: 25 | 90 days supply | Qty: 270 | Fill #1

## 2019-03-01 MED FILL — PANTOPRAZOLE SOD DR 40 MG T: 40 | 90 days supply | Qty: 90 | Fill #1

## 2019-03-01 MED FILL — SILDENAFIL CITRATE 50 MG TA: 50 | 75 days supply | Qty: 15 | Fill #1

## 2019-03-01 MED FILL — JANUVIA 100 MG TABLET: 100 | 30 days supply | Qty: 30 | Fill #1

## 2019-03-01 MED FILL — XARELTO 2.5 MG TABS: 2.5 | 90 days supply | Qty: 180 | Fill #1

## 2019-03-01 MED FILL — JARDIANCE 25 MG TABLET: 25 | 90 days supply | Qty: 90 | Fill #1

## 2019-03-01 MED FILL — glipiZIDE ER 10 MG TB24: 10 | 90 days supply | Qty: 90 | Fill #1

## 2019-03-01 MED FILL — ROSUVASTATIN CALCIUM 40 MG: 40 | 90 days supply | Qty: 90 | Fill #2

## 2019-04-23 ENCOUNTER — Other Ambulatory Visit: Payer: Self-pay | Admitting: Family Medicine

## 2019-04-23 ENCOUNTER — Other Ambulatory Visit: Payer: Self-pay | Admitting: Cardiology

## 2019-04-23 DIAGNOSIS — Z720 Tobacco use: Secondary | ICD-10-CM

## 2019-04-23 DIAGNOSIS — I2510 Atherosclerotic heart disease of native coronary artery without angina pectoris: Secondary | ICD-10-CM

## 2019-04-23 MED FILL — POTASSIUM CHLORIDE CRYS ER: 10 | 30 days supply | Qty: 30 | Fill #0

## 2019-04-23 MED FILL — LISINOPRIL 5 MG TABS: 5 | 90 days supply | Qty: 90 | Fill #1

## 2019-04-23 MED FILL — JANUVIA 100 MG TABLET: 100 | 90 days supply | Qty: 90 | Fill #2

## 2019-04-23 NOTE — Telephone Encounter (Signed)
Ok to refill Sildenafil? 

## 2019-04-26 MED FILL — ACETAMINOPHEN/COD #3 TABLET: 300-30 | 4 days supply | Qty: 16 | Fill #0

## 2019-04-26 MED FILL — AMOXICILLIN 875 MG TABS: 875 | 10 days supply | Qty: 20 | Fill #0

## 2019-04-29 LAB — COLOGUARD: Cologuard: NEGATIVE

## 2019-04-30 MED FILL — TESTOSTERONE 20.25 MG/ACT (: 20.25 MG/AC | 60 days supply | Qty: 75 | Fill #2

## 2019-04-30 MED FILL — SILDENAFIL CITRATE 50 MG TA: 50 | 75 days supply | Qty: 15 | Fill #0

## 2019-05-03 LAB — COLOGUARD: COLOGUARD: NEGATIVE

## 2019-05-03 NOTE — Telephone Encounter (Signed)
Received the results of Cologuard screening.   Screening noted negative.   A negative result indicates a low likelihood of colorectal cancer is present. Following a negative Cologuard result, the American Cancer Society recommends a Cologuard re-screening interval of 3 years.   Letter sent.   

## 2019-06-02 ENCOUNTER — Emergency Department (HOSPITAL_COMMUNITY)
Admission: EM | Admit: 2019-06-02 | Discharge: 2019-06-02 | Disposition: A | Discharge status: Home / Self Care | Payer: No Typology Code available for payment source | Attending: Emergency Medicine | Admitting: Emergency Medicine

## 2019-06-02 ENCOUNTER — Telehealth: Payer: Self-pay | Admitting: Family Medicine

## 2019-06-02 ENCOUNTER — Other Ambulatory Visit: Payer: Self-pay

## 2019-06-02 DIAGNOSIS — I2510 Atherosclerotic heart disease of native coronary artery without angina pectoris: Secondary | ICD-10-CM | POA: Diagnosis not present

## 2019-06-02 DIAGNOSIS — E119 Type 2 diabetes mellitus without complications: Secondary | ICD-10-CM | POA: Diagnosis not present

## 2019-06-02 DIAGNOSIS — Z7982 Long term (current) use of aspirin: Secondary | ICD-10-CM | POA: Insufficient documentation

## 2019-06-02 DIAGNOSIS — Z87891 Personal history of nicotine dependence: Secondary | ICD-10-CM | POA: Insufficient documentation

## 2019-06-02 DIAGNOSIS — Z7984 Long term (current) use of oral hypoglycemic drugs: Secondary | ICD-10-CM | POA: Diagnosis not present

## 2019-06-02 DIAGNOSIS — Z79899 Other long term (current) drug therapy: Secondary | ICD-10-CM | POA: Diagnosis not present

## 2019-06-02 DIAGNOSIS — Z7901 Long term (current) use of anticoagulants: Secondary | ICD-10-CM | POA: Diagnosis not present

## 2019-06-02 DIAGNOSIS — H5462 Unqualified visual loss, left eye, normal vision right eye: Secondary | ICD-10-CM | POA: Insufficient documentation

## 2019-06-02 DIAGNOSIS — I252 Old myocardial infarction: Secondary | ICD-10-CM | POA: Insufficient documentation

## 2019-06-02 DIAGNOSIS — H538 Other visual disturbances: Secondary | ICD-10-CM | POA: Diagnosis present

## 2019-06-02 DIAGNOSIS — H539 Unspecified visual disturbance: Secondary | ICD-10-CM

## 2019-06-02 DIAGNOSIS — H3321 Serous retinal detachment, right eye: Secondary | ICD-10-CM

## 2019-06-02 MED ORDER — TETRACAINE HCL 0.5 % OP SOLN
1.0000 [drp] | Freq: Once | OPHTHALMIC | Status: AC
  Administered 2019-06-02: 14:00:00 1 [drp] via OPHTHALMIC
  Filled 2019-06-02: qty 4

## 2019-06-02 MED ORDER — FLUORESCEIN SODIUM 1 MG OP STRP
1.0000 | ORAL_STRIP | Freq: Once | OPHTHALMIC | Status: AC
  Administered 2019-06-02: 1 via OPHTHALMIC
  Filled 2019-06-02: qty 1

## 2019-06-02 NOTE — ED Triage Notes (Signed)
Pt. Stated on Friday around 2pm he was missing a spot of his vision at the 6 O clock position. And when he looks straight ahead he cant see the top of the circle however when he looks up he can see it. Patient stated the spot as gotten bigger since Friday.

## 2019-06-02 NOTE — ED Notes (Addendum)
Visual acuity Right eye: 20/40, Left eye: 20/40, Both eyes: 20/40. Pt had glasses on during screening.

## 2019-06-02 NOTE — Telephone Encounter (Signed)
Pt called in Saturday 2/27  for the past 24-48 hours he noticed change in vision, he has floaters and a ring when he looks certain directions also noted a small black dot, it has not progressed but has not resolved. 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

## 2019-06-02 NOTE — Discharge Instructions (Signed)
You will be seeing the ophthalmology doctor, Dr. Cathey Endow, tomorrow at his office at 1:15 pm.  Return to the emergency department for any worsening vision changes, pain or any other worsening or concerning symptoms.
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
## 2019-06-03 ENCOUNTER — Ambulatory Visit (INDEPENDENT_AMBULATORY_CARE_PROVIDER_SITE_OTHER): Payer: No Typology Code available for payment source | Admitting: Ophthalmology

## 2019-06-03 ENCOUNTER — Encounter (INDEPENDENT_AMBULATORY_CARE_PROVIDER_SITE_OTHER): Payer: Self-pay | Admitting: Ophthalmology

## 2019-06-03 DIAGNOSIS — H3322 Serous retinal detachment, left eye: Secondary | ICD-10-CM | POA: Diagnosis not present

## 2019-06-03 DIAGNOSIS — H33321 Round hole, right eye: Secondary | ICD-10-CM

## 2019-06-03 DIAGNOSIS — H3581 Retinal edema: Secondary | ICD-10-CM | POA: Diagnosis not present

## 2019-06-03 DIAGNOSIS — E119 Type 2 diabetes mellitus without complications: Secondary | ICD-10-CM

## 2019-06-03 DIAGNOSIS — H35411 Lattice degeneration of retina, right eye: Secondary | ICD-10-CM | POA: Diagnosis not present

## 2019-06-03 DIAGNOSIS — I10 Essential (primary) hypertension: Secondary | ICD-10-CM

## 2019-06-03 DIAGNOSIS — H25813 Combined forms of age-related cataract, bilateral: Secondary | ICD-10-CM

## 2019-06-03 DIAGNOSIS — H35033 Hypertensive retinopathy, bilateral: Secondary | ICD-10-CM
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
## 2019-06-03 NOTE — Telephone Encounter (Signed)
Is he seeing Dr. Cathey Endow ophthalmology today?

## 2019-06-03 NOTE — Telephone Encounter (Signed)
Yes, the hospital had set patient up for a consultation on yesterday to see Dr. Cathey Endow today at 1:15 PM. I spoke with the patient and he is aware of this appointment and I also verified with Dr. Cathey Endow that they had him scheduled in which they did.

## 2019-06-04 ENCOUNTER — Ambulatory Visit (INDEPENDENT_AMBULATORY_CARE_PROVIDER_SITE_OTHER): Payer: No Typology Code available for payment source | Admitting: Ophthalmology

## 2019-06-04 ENCOUNTER — Encounter (INDEPENDENT_AMBULATORY_CARE_PROVIDER_SITE_OTHER): Payer: Self-pay | Admitting: Ophthalmology

## 2019-06-04 ENCOUNTER — Other Ambulatory Visit: Payer: Self-pay

## 2019-06-04 DIAGNOSIS — H3581 Retinal edema: Secondary | ICD-10-CM | POA: Diagnosis not present

## 2019-06-04 DIAGNOSIS — H35033 Hypertensive retinopathy, bilateral: Secondary | ICD-10-CM

## 2019-06-04 DIAGNOSIS — H33321 Round hole, right eye: Secondary | ICD-10-CM

## 2019-06-04 DIAGNOSIS — H3322 Serous retinal detachment, left eye: Secondary | ICD-10-CM

## 2019-06-04 DIAGNOSIS — H25813 Combined forms of age-related cataract, bilateral: Secondary | ICD-10-CM

## 2019-06-04 DIAGNOSIS — I10 Essential (primary) hypertension: Secondary | ICD-10-CM

## 2019-06-04 DIAGNOSIS — H35411 Lattice degeneration of retina, right eye: Secondary | ICD-10-CM

## 2019-06-04 DIAGNOSIS — E119 Type 2 diabetes mellitus without complications: Secondary | ICD-10-CM

## 2019-06-04 MED ORDER — NEOMYCIN-POLYMYXIN-DEXAMETH 0.1 % OP OINT: 1.0000 "application " | TOPICAL_OINTMENT | Freq: Four times a day (QID) | OPHTHALMIC | 2 refills | Status: DC

## 2019-06-04 MED FILL — NEO/POLY/DEXAMET EYE OINT: 3.5-10000-0 | 14 days supply | Qty: 4 | Fill #0

## 2019-06-04 NOTE — Addendum Note (Signed)
Addended by: Karie Chimera on: 06/04/2019 03:53 PM   Modules accepted: Orders
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
## 2019-06-05 ENCOUNTER — Encounter (INDEPENDENT_AMBULATORY_CARE_PROVIDER_SITE_OTHER): Payer: Self-pay | Admitting: Ophthalmology

## 2019-06-05 ENCOUNTER — Ambulatory Visit (INDEPENDENT_AMBULATORY_CARE_PROVIDER_SITE_OTHER): Payer: No Typology Code available for payment source | Admitting: Ophthalmology

## 2019-06-05 ENCOUNTER — Other Ambulatory Visit (HOSPITAL_COMMUNITY)
Admission: RE | Admit: 2019-06-05 | Discharge: 2019-06-05 | Disposition: A | Discharge status: Home / Self Care | Payer: No Typology Code available for payment source | Source: Ambulatory Visit | Attending: Ophthalmology | Admitting: Ophthalmology

## 2019-06-05 DIAGNOSIS — Z20822 Contact with and (suspected) exposure to covid-19: Secondary | ICD-10-CM | POA: Insufficient documentation

## 2019-06-05 DIAGNOSIS — I10 Essential (primary) hypertension: Secondary | ICD-10-CM

## 2019-06-05 DIAGNOSIS — H35411 Lattice degeneration of retina, right eye: Secondary | ICD-10-CM

## 2019-06-05 DIAGNOSIS — H3581 Retinal edema: Secondary | ICD-10-CM | POA: Diagnosis not present

## 2019-06-05 DIAGNOSIS — Z01812 Encounter for preprocedural laboratory examination: Secondary | ICD-10-CM | POA: Diagnosis present

## 2019-06-05 DIAGNOSIS — H33321 Round hole, right eye: Secondary | ICD-10-CM

## 2019-06-05 DIAGNOSIS — H25813 Combined forms of age-related cataract, bilateral: Secondary | ICD-10-CM

## 2019-06-05 DIAGNOSIS — H35033 Hypertensive retinopathy, bilateral: Secondary | ICD-10-CM

## 2019-06-05 DIAGNOSIS — H3322 Serous retinal detachment, left eye: Secondary | ICD-10-CM

## 2019-06-05 DIAGNOSIS — E119 Type 2 diabetes mellitus without complications: Secondary | ICD-10-CM

## 2019-06-05 LAB — SARS CORONAVIRUS 2 (TAT 6-24 HRS): SARS Coronavirus 2: NEGATIVE
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
## 2019-06-05 NOTE — Progress Notes (Signed)
Several unsuccessful attempts were made to contact pt. Nurse lvm with pre-op instructions only. Please complete pt assessment on DOS. Pt instructed to hold Jardiance and Glipizide tonight. Pt made aware to hold all diabetes medications on DOS ( No Jardiance, Glipizide, Metformin and Januvia). Pt made aware to follow surgeon's instructions regarding Aspirin and Xarelto . Pt made aware to stop taking vitamins, fish/krill oil, CO Q 10 and herbal medications. Do not take any NSAIDs ie: Ibuprofen, Advil, Naproxen (Aleve), Motrin, BC and Goody Powder. Pt made aware to check CBG every 2 hours prior to arrival to hospital on DOS. Pt made aware to treat a CBG < 70 with 4 glucose tabs or glucose gel or 4 ounces of apple or cranberry juice, wait 15 minutes after intervention to recheck CBG, if CBG remains < 70, call Short Stay unit to speak with a nurse. Pt reminded to quarantine. See PA, Anesthesiology, note.

## 2019-06-05 NOTE — Progress Notes (Signed)
Anesthesia Chart Review: Same day workup  Follows with cardiology for hx of CAD s/p NSTEMI in 5/13 and inferior STEMI in 12/14. In 5/13, he had an NSTEMI with BMS to totally occluded distal RCA. In 12/14, he had an inferior STEMI with 95% mid RCA (distal RCA stent patent) at [facility]. He had BMS to [facility]. Echo (12/14) showed EF 50-55%.He was admitted with unstable angina in 12/17, had DES to mid LAD 90% stenosis. Peripheral arterial dopplers in 8/18 showed 75-99% mid left femoral artery stenosis.  Echo in 12/18 with EF 55-60%.  Last seen by Dr. Shirlee Latch 07/31/18. Per note, "He is doing well, working full time as a Engineer, civil (consulting) in the [employer].  Main complaint is pain from right knee lateral meniscal tear, plan for surgery when outpatient procedures restart.  No chest pain.  He notes mild dyspnea carrying bags of soil out in his yard, but otherwise no significant exertional dyspnea.  He has not noted claudication.  Rare NSAID use.  Glucose appears to be relatively controlled.    Recent diagnosis of retinal detachment 06/02/19, referred urgently to ophthalmology by PCP.  Will need DOS labs and eval.  EKG 09/27/18: Sinus tachycardia. Rate 103. Otherwise normal ECG  TTE 03/30/17: - Left ventricle: The cavity size was normal. Systolic function was  normal. The estimated ejection fraction was in the range of 55%  to 60%. Wall motion was normal; there were no regional wall  motion abnormalities. Left ventricular diastolic function  parameters were normal.  - Aortic valve: There was mild regurgitation.  - Left atrium: The atrium was moderately dilated.   Impressions:   - Previously described inferolateral hypokinesis is not seen on the  current study.   Cardiac Cath 03/30/2016 1. Severe LAD stenosis, treated successfully with PCI using a long drug-eluting stent platform 2. Continued patency of the stented segments in the right coronary artery 3. Severe stenosis of the  distal left circumflex very small area of myocardium, appropriate for medical therapy 4. Normal LV systolic function   Zannie Cove [facility] Short Stay Center/Anesthesiology Phone [phone] 06/05/2019 4:39 PM

## 2019-06-05 NOTE — Anesthesia Preprocedure Evaluation (Addendum)
Anesthesia Evaluation  Patient identified by MRN, date of birth, ID band Patient awake    Reviewed: Allergy & Precautions, NPO status , Patient's Chart, lab work & pertinent test results  Airway Mallampati: II  TM Distance: >3 FB Neck ROM: Full    Dental  (+) Teeth Intact, Dental Advisory Given   Pulmonary former smoker,    breath sounds clear to auscultation       Cardiovascular  Rhythm:Regular Rate:Normal     Neuro/Psych    GI/Hepatic   Endo/Other  diabetes  Renal/GU      Musculoskeletal   Abdominal   Peds  Hematology   Anesthesia Other Findings   Reproductive/Obstetrics                            Anesthesia Physical Anesthesia Plan  ASA: III  Anesthesia Plan: General   Post-op Pain Management:    Induction: Intravenous  PONV Risk Score and Plan: Ondansetron  Airway Management Planned: Oral ETT  Additional Equipment:   Intra-op Plan:   Post-operative Plan: Extubation in OR  Informed Consent: I have reviewed the patients History and Physical, chart, labs and discussed the procedure including the risks, benefits and alternatives for the proposed anesthesia with the patient or authorized representative who has indicated his/her understanding and acceptance.     Dental advisory given  Plan Discussed with: CRNA and Anesthesiologist  Anesthesia Plan Comments: (Follows with cardiology for hx of CAD s/p NSTEMI in 5/13 and inferior STEMI in 12/14. In 5/13, he had an NSTEMI with BMS to totally occluded distal RCA. In 12/14, he had an inferior STEMI with 95% mid RCA (distal RCA stent patent) at [address]. He had BMS to [facility]. Echo (12/14) showed EF 50-55%.He was admitted with unstable angina in 12/17, had DES to mid LAD 90% stenosis. Peripheral arterial dopplers in 8/18 showed 75-99% mid left femoral artery stenosis.  Echo in 12/18 with EF 55-60%.  Last seen by Dr.  Shirlee Latch 07/31/18. Per note, "He is doing well, working full time as a Engineer, civil (consulting) in the [employer].  Main complaint is pain from right knee lateral meniscal tear, plan for surgery when outpatient procedures restart.  No chest pain.  He notes mild dyspnea carrying bags of soil out in his yard, but otherwise no significant exertional dyspnea.  He has not noted claudication.  Rare NSAID use.  Glucose appears to be relatively controlled.    Recent diagnosis of retinal detachment 06/02/19, referred urgently to ophthalmology by PCP.  Will need DOS labs and eval.  EKG 09/27/18: Sinus tachycardia. Rate 103. Otherwise normal ECG  TTE 03/30/17: - Left ventricle: The cavity size was normal. Systolic function was  normal. The estimated ejection fraction was in the range of 55%  to 60%. Wall motion was normal; there were no regional wall  motion abnormalities. Left ventricular diastolic function  parameters were normal.  - Aortic valve: There was mild regurgitation.  - Left atrium: The atrium was moderately dilated.   Impressions:   - Previously described inferolateral hypokinesis is not seen on the  current study.   Cardiac Cath 03/30/2016 1. Severe LAD stenosis, treated successfully with PCI using a long drug-eluting stent platform 2. Continued patency of the stented segments in the right coronary artery 3. Severe stenosis of the distal left circumflex very small area of myocardium, appropriate for medical therapy 4. Normal LV systolic function)       Anesthesia Quick  Evaluation

## 2019-06-06 ENCOUNTER — Other Ambulatory Visit: Payer: Self-pay

## 2019-06-06 ENCOUNTER — Ambulatory Visit (HOSPITAL_COMMUNITY): Payer: No Typology Code available for payment source | Admitting: Physician Assistant

## 2019-06-06 ENCOUNTER — Encounter (HOSPITAL_COMMUNITY)
Admission: RE | Disposition: A | Discharge status: Home / Self Care | Payer: Self-pay | Source: Home / Self Care | Attending: Ophthalmology

## 2019-06-06 ENCOUNTER — Ambulatory Visit (HOSPITAL_COMMUNITY)
Admission: RE | Admit: 2019-06-06 | Discharge: 2019-06-06 | Disposition: A | Discharge status: Home / Self Care | Payer: No Typology Code available for payment source | Attending: Ophthalmology | Admitting: Ophthalmology

## 2019-06-06 ENCOUNTER — Encounter (HOSPITAL_COMMUNITY): Payer: Self-pay | Admitting: Ophthalmology

## 2019-06-06 DIAGNOSIS — H33321 Round hole, right eye: Secondary | ICD-10-CM | POA: Diagnosis not present

## 2019-06-06 DIAGNOSIS — Z955 Presence of coronary angioplasty implant and graft: Secondary | ICD-10-CM | POA: Insufficient documentation

## 2019-06-06 DIAGNOSIS — H33002 Unspecified retinal detachment with retinal break, left eye: Secondary | ICD-10-CM | POA: Diagnosis not present

## 2019-06-06 DIAGNOSIS — H338 Other retinal detachments: Secondary | ICD-10-CM | POA: Diagnosis not present

## 2019-06-06 DIAGNOSIS — I252 Old myocardial infarction: Secondary | ICD-10-CM | POA: Diagnosis not present

## 2019-06-06 DIAGNOSIS — H35411 Lattice degeneration of retina, right eye: Secondary | ICD-10-CM | POA: Diagnosis not present

## 2019-06-06 DIAGNOSIS — I2510 Atherosclerotic heart disease of native coronary artery without angina pectoris: Secondary | ICD-10-CM | POA: Insufficient documentation

## 2019-06-06 DIAGNOSIS — H33301 Unspecified retinal break, right eye: Secondary | ICD-10-CM | POA: Diagnosis not present

## 2019-06-06 DIAGNOSIS — Z87891 Personal history of nicotine dependence: Secondary | ICD-10-CM | POA: Insufficient documentation

## 2019-06-06 HISTORY — PX: GAS/FLUID EXCHANGE: SHX5334

## 2019-06-06 HISTORY — PX: RETINAL DETACHMENT SURGERY: SHX105

## 2019-06-06 HISTORY — PX: PARS PLANA VITRECTOMY: SHX2166

## 2019-06-06 HISTORY — PX: PHOTOCOAGULATION WITH LASER: SHX6027

## 2019-06-06 HISTORY — PX: EYE SURGERY: SHX253

## 2019-06-06 HISTORY — DX: Anemia, unspecified: D64.9

## 2019-06-06 HISTORY — PX: PHOTOCOAGULATION: SHX5303

## 2019-06-06 LAB — GLUCOSE, CAPILLARY
Glucose-Capillary: 121 mg/dL — ABNORMAL HIGH (ref 70–99)
Glucose-Capillary: 83 mg/dL (ref 70–99)
Glucose-Capillary: 117 mg/dL — ABNORMAL HIGH (ref 70–99)
Glucose-Capillary: 107 mg/dL — ABNORMAL HIGH (ref 70–99)

## 2019-06-06 LAB — SURGICAL PCR SCREEN
MRSA, PCR: NEGATIVE
Staphylococcus aureus: NEGATIVE

## 2019-06-06 SURGERY — PARS PLANA VITRECTOMY WITH 25 GAUGE
Anesthesia: General | Site: Eye | Laterality: Right

## 2019-06-06 MED ORDER — TROPICAMIDE 1 % OP SOLN
1.0000 [drp] | OPHTHALMIC | Status: AC | PRN
  Administered 2019-06-06 (×3): 1 [drp] via OPHTHALMIC
  Filled 2019-06-06: qty 15

## 2019-06-06 MED ORDER — STERILE WATER FOR INJECTION IJ SOLN
INTRAMUSCULAR | Status: AC
  Filled 2019-06-06: qty 10

## 2019-06-06 MED ORDER — MUPIROCIN 2 % EX OINT
TOPICAL_OINTMENT | CUTANEOUS | Status: AC
  Filled 2019-06-06: qty 22

## 2019-06-06 MED ORDER — OXYCODONE HCL 5 MG PO TABS
5.0000 mg | ORAL_TABLET | Freq: Once | ORAL | Status: AC | PRN
  Administered 2019-06-06: 5 mg via ORAL

## 2019-06-06 MED ORDER — BSS PLUS IO SOLN
INTRAOCULAR | Status: AC
  Filled 2019-06-06: qty 500

## 2019-06-06 MED ORDER — BACITRACIN-POLYMYXIN B 500-10000 UNIT/GM OP OINT
TOPICAL_OINTMENT | OPHTHALMIC | Status: DC | PRN
  Administered 2019-06-06: 1 via OPHTHALMIC

## 2019-06-06 MED ORDER — OXYCODONE HCL 5 MG/5ML PO SOLN: 5.0000 mg | Freq: Once | ORAL | Status: AC | PRN

## 2019-06-06 MED ORDER — FENTANYL CITRATE (PF) 100 MCG/2ML IJ SOLN
INTRAMUSCULAR | Status: AC
  Filled 2019-06-06: qty 2

## 2019-06-06 MED ORDER — STERILE WATER FOR IRRIGATION IR SOLN
Status: DC | PRN
  Administered 2019-06-06: 1000 mL

## 2019-06-06 MED ORDER — PREDNISOLONE ACETATE 1 % OP SUSP
OPHTHALMIC | Status: AC
  Filled 2019-06-06: qty 5

## 2019-06-06 MED ORDER — OXYCODONE HCL 5 MG PO TABS
ORAL_TABLET | ORAL | Status: AC
  Filled 2019-06-06: qty 1

## 2019-06-06 MED ORDER — GATIFLOXACIN 0.5 % OP SOLN OPTIME - NO CHARGE
OPHTHALMIC | Status: DC | PRN
  Administered 2019-06-06: 1 [drp] via OPHTHALMIC

## 2019-06-06 MED ORDER — EPINEPHRINE PF 1 MG/ML IJ SOLN
INTRAOCULAR | Status: DC | PRN
  Administered 2019-06-06: 500 mL

## 2019-06-06 MED ORDER — NA CHONDROIT SULF-NA HYALURON 40-30 MG/ML IO SOLN
INTRAOCULAR | Status: AC
  Filled 2019-06-06: qty 0.5

## 2019-06-06 MED ORDER — DEXAMETHASONE SODIUM PHOSPHATE 10 MG/ML IJ SOLN
INTRAMUSCULAR | Status: DC | PRN
  Administered 2019-06-06: 10 mg via INTRAVENOUS

## 2019-06-06 MED ORDER — ATROPINE SULFATE 1 % OP SOLN
OPHTHALMIC | Status: DC | PRN
  Administered 2019-06-06: 1 [drp] via OPHTHALMIC

## 2019-06-06 MED ORDER — SUGAMMADEX SODIUM 200 MG/2ML IV SOLN
INTRAVENOUS | Status: DC | PRN
  Administered 2019-06-06: 426.4 mg via INTRAVENOUS

## 2019-06-06 MED ORDER — BRIMONIDINE TARTRATE 0.2 % OP SOLN
OPHTHALMIC | Status: AC
  Filled 2019-06-06: qty 5

## 2019-06-06 MED ORDER — ONDANSETRON HCL 4 MG/2ML IJ SOLN: 4.0000 mg | Freq: Once | INTRAMUSCULAR | Status: DC | PRN

## 2019-06-06 MED ORDER — SODIUM CHLORIDE (PF) 0.9 % IJ SOLN
INTRAMUSCULAR | Status: AC
  Filled 2019-06-06: qty 10

## 2019-06-06 MED ORDER — ATROPINE SULFATE 1 % OP SOLN
1.0000 [drp] | OPHTHALMIC | Status: AC | PRN
  Administered 2019-06-06 (×3): 1 [drp] via OPHTHALMIC
  Filled 2019-06-06: qty 5

## 2019-06-06 MED ORDER — DEXMEDETOMIDINE HCL 200 MCG/2ML IV SOLN
INTRAVENOUS | Status: DC | PRN
  Administered 2019-06-06: 12 ug via INTRAVENOUS
  Administered 2019-06-06: 4 ug via INTRAVENOUS
  Administered 2019-06-06 (×3): 8 ug via INTRAVENOUS

## 2019-06-06 MED ORDER — DORZOLAMIDE HCL-TIMOLOL MAL 2-0.5 % OP SOLN
OPHTHALMIC | Status: AC
  Filled 2019-06-06: qty 10

## 2019-06-06 MED ORDER — PREDNISOLONE ACETATE 1 % OP SUSP
OPHTHALMIC | Status: DC | PRN
  Administered 2019-06-06: 1 [drp]

## 2019-06-06 MED ORDER — FENTANYL CITRATE (PF) 100 MCG/2ML IJ SOLN
25.0000 ug | INTRAMUSCULAR | Status: DC | PRN
  Administered 2019-06-06: 25 ug via INTRAVENOUS
  Administered 2019-06-06: 50 ug via INTRAVENOUS

## 2019-06-06 MED ORDER — MIDAZOLAM HCL 5 MG/5ML IJ SOLN
INTRAMUSCULAR | Status: DC | PRN
  Administered 2019-06-06: 2 mg via INTRAVENOUS

## 2019-06-06 MED ORDER — LIDOCAINE 2% (20 MG/ML) 5 ML SYRINGE
INTRAMUSCULAR | Status: DC | PRN
  Administered 2019-06-06: 100 mg via INTRAVENOUS

## 2019-06-06 MED ORDER — FENTANYL CITRATE (PF) 250 MCG/5ML IJ SOLN
INTRAMUSCULAR | Status: AC
  Filled 2019-06-06: qty 5

## 2019-06-06 MED ORDER — BRIMONIDINE TARTRATE 0.2 % OP SOLN
OPHTHALMIC | Status: DC | PRN
  Administered 2019-06-06: 1 [drp] via OPHTHALMIC

## 2019-06-06 MED ORDER — ROCURONIUM BROMIDE 10 MG/ML (PF) SYRINGE
PREFILLED_SYRINGE | INTRAVENOUS | Status: AC
  Filled 2019-06-06: qty 10

## 2019-06-06 MED ORDER — SODIUM CHLORIDE 0.9 % IV SOLN: INTRAVENOUS | Status: DC | PRN

## 2019-06-06 MED ORDER — PROPARACAINE HCL 0.5 % OP SOLN
1.0000 [drp] | OPHTHALMIC | Status: AC | PRN
  Administered 2019-06-06 (×3): 1 [drp] via OPHTHALMIC
  Filled 2019-06-06: qty 15

## 2019-06-06 MED ORDER — DORZOLAMIDE HCL-TIMOLOL MAL 2-0.5 % OP SOLN
OPHTHALMIC | Status: DC | PRN
  Administered 2019-06-06: 1 [drp] via OPHTHALMIC

## 2019-06-06 MED ORDER — PROPOFOL 10 MG/ML IV BOLUS
INTRAVENOUS | Status: DC | PRN
  Administered 2019-06-06: 200 mg via INTRAVENOUS

## 2019-06-06 MED ORDER — ROCURONIUM BROMIDE 10 MG/ML (PF) SYRINGE
PREFILLED_SYRINGE | INTRAVENOUS | Status: DC | PRN
  Administered 2019-06-06: 100 mg via INTRAVENOUS
  Administered 2019-06-06 (×2): 20 mg via INTRAVENOUS

## 2019-06-06 MED ORDER — NA CHONDROIT SULF-NA HYALURON 40-30 MG/ML IO SOLN
INTRAOCULAR | Status: DC | PRN
  Administered 2019-06-06: 0.5 mL via INTRAOCULAR

## 2019-06-06 MED ORDER — EPINEPHRINE PF 1 MG/ML IJ SOLN
INTRAMUSCULAR | Status: AC
  Filled 2019-06-06: qty 1

## 2019-06-06 MED ORDER — FENTANYL CITRATE (PF) 250 MCG/5ML IJ SOLN
INTRAMUSCULAR | Status: DC | PRN
  Administered 2019-06-06: 50 ug via INTRAVENOUS
  Administered 2019-06-06 (×2): 100 ug via INTRAVENOUS

## 2019-06-06 MED ORDER — PHENYLEPHRINE HCL 10 % OP SOLN
1.0000 [drp] | OPHTHALMIC | Status: AC | PRN
  Administered 2019-06-06 (×3): 1 [drp] via OPHTHALMIC
  Filled 2019-06-06: qty 5

## 2019-06-06 MED ORDER — GATIFLOXACIN 0.5 % OP SOLN
OPHTHALMIC | Status: AC
  Filled 2019-06-06: qty 2.5

## 2019-06-06 MED ORDER — LACTATED RINGERS IV SOLN: INTRAVENOUS | Status: DC

## 2019-06-06 MED ORDER — ONDANSETRON HCL 4 MG/2ML IJ SOLN
INTRAMUSCULAR | Status: DC | PRN
  Administered 2019-06-06: 4 mg via INTRAVENOUS

## 2019-06-06 MED ORDER — POLYMYXIN B SULFATE 500000 UNITS IJ SOLR
INTRAMUSCULAR | Status: AC
  Filled 2019-06-06: qty 500000

## 2019-06-06 MED ORDER — CEFTAZIDIME 1 G IJ SOLR
INTRAMUSCULAR | Status: AC
  Filled 2019-06-06: qty 1

## 2019-06-06 MED ORDER — BACITRACIN-POLYMYXIN B 500-10000 UNIT/GM OP OINT
TOPICAL_OINTMENT | OPHTHALMIC | Status: AC
  Filled 2019-06-06: qty 3.5

## 2019-06-06 MED ORDER — TRIAMCINOLONE ACETONIDE 40 MG/ML IJ SUSP
INTRAMUSCULAR | Status: AC
  Filled 2019-06-06: qty 5

## 2019-06-06 MED ORDER — BSS IO SOLN
INTRAOCULAR | Status: AC
  Filled 2019-06-06: qty 15

## 2019-06-06 MED ORDER — MIDAZOLAM HCL 2 MG/2ML IJ SOLN
INTRAMUSCULAR | Status: AC
  Filled 2019-06-06: qty 2

## 2019-06-06 MED ORDER — BSS IO SOLN
INTRAOCULAR | Status: DC | PRN
  Administered 2019-06-06: 15 mL via INTRAOCULAR

## 2019-06-06 MED ORDER — STERILE WATER FOR INJECTION IJ SOLN
INTRAMUSCULAR | Status: DC | PRN
  Administered 2019-06-06: 20 mL

## 2019-06-06 MED ORDER — TRIAMCINOLONE ACETONIDE 40 MG/ML IJ SUSP
INTRAMUSCULAR | Status: DC | PRN
  Administered 2019-06-06: 40 mg

## 2019-06-06 MED ORDER — CARBACHOL 0.01 % IO SOLN
INTRAOCULAR | Status: AC
  Filled 2019-06-06: qty 1.5

## 2019-06-06 SURGICAL SUPPLY — 45 items
APPLICATOR COTTON TIP 6 STRL (MISCELLANEOUS) ×8 IMPLANT
APPLICATOR COTTON TIP 6IN STRL (MISCELLANEOUS) ×12
BAND WRIST GAS GREEN (MISCELLANEOUS) ×2 IMPLANT
BNDG EYE OVAL (GAUZE/BANDAGES/DRESSINGS) ×9 IMPLANT
CABLE BIPOLOR RESECTION CORD (MISCELLANEOUS) ×3 IMPLANT
CANNULA ANT CHAM MAIN (OPHTHALMIC RELATED) ×3 IMPLANT
CANNULA DUALBORE 25G (CANNULA) ×3 IMPLANT
CANNULA FLEX TIP 25G (CANNULA) ×6 IMPLANT
CLSR STERI-STRIP ANTIMIC 1/2X4 (GAUZE/BANDAGES/DRESSINGS) ×3 IMPLANT
COTTONBALL LRG STERILE PKG (GAUZE/BANDAGES/DRESSINGS) ×9 IMPLANT
DRAPE MICROSCOPE LEICA 46X105 (MISCELLANEOUS) ×3 IMPLANT
DRAPE OPHTHALMIC 77X100 STRL (CUSTOM PROCEDURE TRAY) ×3 IMPLANT
FORCEPS GRIESHABER ILM 25G A (INSTRUMENTS) ×3 IMPLANT
GAS AUTO FILL CONSTEL (OPHTHALMIC) ×3
GAS AUTO FILL CONSTELLATION (OPHTHALMIC) ×2 IMPLANT
GAS WRIST BAND GREEN (MISCELLANEOUS) ×1
GLOVE BIO SURGEON STRL SZ7.5 (GLOVE) ×6 IMPLANT
GLOVE BIOGEL M 7.0 STRL (GLOVE) ×3 IMPLANT
GOWN STRL REUS W/ TWL LRG LVL3 (GOWN DISPOSABLE) ×4 IMPLANT
GOWN STRL REUS W/ TWL XL LVL3 (GOWN DISPOSABLE) ×2 IMPLANT
GOWN STRL REUS W/TWL LRG LVL3 (GOWN DISPOSABLE) ×2
GOWN STRL REUS W/TWL XL LVL3 (GOWN DISPOSABLE) ×1
KIT BASIN OR (CUSTOM PROCEDURE TRAY) ×3 IMPLANT
KIT PERFLUORON PROCEDURE 5ML (MISCELLANEOUS) ×3 IMPLANT
NEEDLE 18GX1X1/2 (RX/OR ONLY) (NEEDLE) ×9 IMPLANT
NEEDLE 25GX 5/8IN NON SAFETY (NEEDLE) ×6 IMPLANT
PACK VITRECTOMY CUSTOM (CUSTOM PROCEDURE TRAY) ×3 IMPLANT
PAD ARMBOARD 7.5X6 YLW CONV (MISCELLANEOUS) ×6 IMPLANT
PAK PIK VITRECTOMY CVS 25GA (OPHTHALMIC) ×3 IMPLANT
PENCIL BIPOLAR 25GA STR DISP (OPHTHALMIC RELATED) ×3 IMPLANT
PROBE ENDO DIATHERMY 25G (MISCELLANEOUS) ×3 IMPLANT
PROBE LASER ILLUM FLEX CVD 25G (OPHTHALMIC) ×3 IMPLANT
SET INJECTOR OIL FLUID CONSTEL (OPHTHALMIC) ×3 IMPLANT
SHIELD EYE LENSE ONLY DISP (GAUZE/BANDAGES/DRESSINGS) ×3 IMPLANT
SUT VICRYL 7 0 TG140 8 (SUTURE) ×3 IMPLANT
SYR 10ML LL (SYRINGE) ×6 IMPLANT
SYR 20ML LL LF (SYRINGE) ×3 IMPLANT
SYR 5ML LL (SYRINGE) ×3 IMPLANT
SYR BULB 3OZ (MISCELLANEOUS) ×3 IMPLANT
SYR TB 1ML LUER SLIP (SYRINGE) ×9 IMPLANT
TAPE SURG TRANSPORE 1 IN (GAUZE/BANDAGES/DRESSINGS) ×2 IMPLANT
TAPE SURGICAL TRANSPORE 1 IN (GAUZE/BANDAGES/DRESSINGS) ×1
TOWEL GREEN STERILE FF (TOWEL DISPOSABLE) ×3 IMPLANT
TUBING HIGH PRESS EXTEN 6IN (TUBING) ×3 IMPLANT
WATER STERILE IRR 1000ML POUR (IV SOLUTION) ×3 IMPLANT

## 2019-06-06 NOTE — Discharge Instructions (Addendum)
POSTOPERATIVE INSTRUCTIONS  Your doctor has performed vitreoretinal surgery on you at [facility].  - Keep eye patched and shielded until seen by Dr. Zamora 8 AM tomorrow in clinic - Do not use drops until return - FACE DOWN POSITIONING WHILE AWAKE - Sleep with belly down or on left side, avoid laying flat on back.    - No strenuous bending, stooping or lifting.  - You may not drive until further notice.  - If your doctor used a gas bubble in your eye during the procedure he will advise you on postoperative positioning. If you have a gas bubble you will be wearing a green bracelet that was applied in the operating room. The green bracelet should stay on as long as the gas bubble is in your eye. While the gas bubble is present you should not fly in an airplane. If you require general anesthesia while the gas bubble is present you must notify your anesthesiologist that an intraocular gas bubble is present so he can take the appropriate precautions.  - Tylenol or any other over-the-counter pain reliever can be used according to your doctor. If more pain medicine is required, your doctor will have a prescription for you.  - You may read, go up and down stairs, and watch television.     Brian Zamora, M.D., Ph.D.  

## 2019-06-06 NOTE — Op Note (Signed)
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 Due to residual subretinal fluid and significant vitreous debris, we recommended 25g PPV w/ endolaser and gas for repair of the persistent retinal detachment. On examination of the right eye, it was found to have a focal patch of pigmented lattice degeneration with retinal holes.After discussing the risks, benefits, and alternatives to surgery, the patient electively decided toproceed with surgery of the left eye and laser retinopexy of the right eye,and informed consent was obtained.The surgery was an attempt tobarricade the peripheral lattice degeneration in the in the right eye and reattach the retina in the left eye toimprove the vision within the reasonable expectations of the surgeon.  Procedure in Detail:  The patient was met in the pre-operative holding area where their identification data was verified.It was noted that there was a signed, informed consent in the chart andbotheyeswereverbally verified by the patient--right eye for laser and left eye for surgery. Then theoperative left eye and was markedwith the surgeon's initialswith a marking pen and the right eye was marked with word "laser."The patient was then taken to the operating room and placed in the supine position.General endotracheal anesthesia was induced.  Attention was first turned  to the indirect laser ophthalmoscopy portion of the procedure.Barricade laser retinopexywas performed to a focal patch of pigmented lattice in the peripheral retina of the RIGHT EYE at 0700 oclock.Using the laser indirect ophthalmoscope,244spots were placed at315m power, 200 ms duration.Following completion of laserretinopexy,lubricating ointment was applied to the right eye and the eyelid was taped closed.  TheLEFT EYEwas then prepped with 5% betadine and draped in the normal fashion for ophthalmic surgery and a secondary time-out was performed to identify the correct patient, eyes, procedures, and any allergies.  Attention was then turned to the vitrectomy portion of the operation.The microscope was draped and swung into position.A 25 gauge trocar was inserted in a 30-45 degrees fashion into the inferotemporal quadrant468mposterior tothelimbus in this phakic patient. Correct positioning within the vitreous was verified externally with the light pipe.The infusion was then connected to the cannula and BSS infusion was commenced.Additional ports were placed in the superonasal and superotemporal quadrants, also 4 mm posterior to the limbus. Viscoat was placed on the cornea.The BIOMwas used to visualize the posterior segment. The patient hada residual C3F8 bubble from his pneumatic cryopexy in the vitreous cavity as well as some significant vitreous debris. The previously detached superior retina was reattached, but there was residual subretinal fluid in the nasal and temporal periphery.  There was a large flap tear at 1200 oclock with some surrounding early cryopexy changes. A core vitrectomy was performed using the BIOM visualization system, vitrectomy probe and light pipe. The residual C3F8 bubble was removed at this time.Kenalog was used tomark thevitreous and assist in dissecting vitreous membranes. A posterior vitreous detachment was confirmed.  Traction was removed from  all retinal breaks. The breaks weretrimmed using the cutter to smooth the edges. Perfluoron was used to flatten the retina and push the subretinal fluid anteriorly and superiorly to the 1200 oclock break.A complete  fluid-air exchange was performed with a soft tip extrusion cannula over theretinal breaks. Some residual SRF accumulated in the inferotemporal quadrant, thus a retinotomy was made at the 4 oclock position out side the IT arcades, using the fine point diathermy probe and the extrusion cannula.  The residual SRF was drained via the soft-tipped extrusion cannula. After completion of these maneuvers, the posterior pole and peripheral retina were noted to be flat. There was a residual shallow pocket of SRF nasal to the optic disc, which should resolve under gas. Under air, additional endolaser was applied to the superior break, the IT retinotomy and to the anterior peripheral retina 360.  The superotemporal trocar wasremoved and sutured with 7-0 vicryl in an interrupted fashion.A complete air to 14% C3F8 gas exchange was performed through the infusion cannula and vented through the superonasal trocar using the extrusion cannula.Thesuperonasal trocar andinfusion cannula and associated trocar werethen removed and sutured with 7-0 vicryl in an interrupted fashion.   The eye was brought to physiologic IOP via injection of the 14% C3F8 gas with a 30g needle, injected 4 mm posterior to the limbus in the SN quadrant. Subconjunctival injections of Antibiotic and kenalogwere administered. The lid speculum and drapes were removed. Drops of an antibiotic,antihypertensives andsteroid were given. The eye was patched and shielded. The patient tolerated the procedure well without any intraoperative or immediate postoperative complications. The patient was taken to the recovery room in good condition. The patient was instructed to maintain a strict face-down position andwill be seen by Dr.  Gerarda [facility] clinic.

## 2019-06-06 NOTE — Interval H&P Note (Signed)
History and Physical Interval Note:  06/06/2019 3:06 PM  Derrick Todd  has presented today for surgery, with the diagnosis of retinal detachment, left eye; lattice dengeneration, right eye.  The various methods of treatment have been discussed with the patient and family. After consideration of risks, benefits and other options for treatment, the patient has consented to  Procedure(s): PARS PLANA VITRECTOMY WITH 25 GAUGE (Left) as a surgical intervention.  The patient's history has been reviewed, patient examined, no change in status, stable for surgery.  I have reviewed the patient's chart and labs.  Questions were answered to the patient's satisfaction.     Derrick Todd

## 2019-06-06 NOTE — Transfer of Care (Signed)
Immediate Anesthesia Transfer of Care Note  Patient: Derrick Todd  Procedure(s) Performed: PARS PLANA VITRECTOMY WITH 25 GAUGE (Left Eye) Laser Retinopexy via Indirect Opthalmoscopy (Right Eye) Photocoagulation (Left Eye) Gas/Fluid Exchange (Left Eye)  Patient Location: PACU  Anesthesia Type:General  Level of Consciousness: awake, alert  and oriented  Airway & Oxygen Therapy: Patient Spontanous Breathing and Patient connected to nasal cannula oxygen  Post-op Assessment: Report given to RN, Post -op Vital signs reviewed and stable and Patient moving all extremities X 4  Post vital signs: Reviewed and stable  Last Vitals:  Vitals Value Taken Time  BP 144/76 06/06/19 1826  Temp    Pulse 85 06/06/19 1828  Resp 16 06/06/19 1828  SpO2 98 % 06/06/19 1828  Vitals shown include unvalidated device data.  Last Pain:  Vitals:   06/06/19 1230  TempSrc:   PainSc: 0-No pain         Complications: No apparent anesthesia complications

## 2019-06-06 NOTE — Brief Op Note (Signed)
06/06/2019  6:19 PM  PATIENT:  Derrick Todd  59 y.o. male  PRE-OPERATIVE DIAGNOSIS:  retinal detachment, left eye; lattice dengeneration, right eye  POST-OPERATIVE DIAGNOSIS:  retinal detachment, left eye; lattice dengeneration, right eye  PROCEDURE:  Procedure(s): PARS PLANA VITRECTOMY WITH 25 GAUGE (Left) Laser Retinopexy via Indirect Opthalmoscopy (Right) Photocoagulation (Left) Gas/Fluid Exchange (Left)  SURGEON:  Surgeon(s) and Role:    Rennis Chris, MD - Primary  ASSISTANTS: Laurian Brim, Ophthalmic Assistant    ANESTHESIA:   general  EBL:  2 mL   BLOOD ADMINISTERED:none  DRAINS: none   LOCAL MEDICATIONS USED:  NONE  SPECIMEN:  No Specimen  DISPOSITION OF SPECIMEN:  [name]  COUNTS:  YES  TOURNIQUET:  * No tourniquets in log *  DICTATION: .Note written in EPIC  PLAN OF CARE: Discharge to home after PACU  PATIENT DISPOSITION:  PACU - hemodynamically stable.   Delay start of Pharmacological VTE agent (>24hrs) due to surgical blood loss or risk of bleeding: no

## 2019-06-06 NOTE — Anesthesia Procedure Notes (Signed)
Procedure Name: Intubation Date/Time: 06/06/2019 3:52 PM Performed by: Mariea Clonts, CRNA Pre-anesthesia Checklist: Patient identified, Emergency Drugs available, Suction available and Patient being monitored Patient Re-evaluated:Patient Re-evaluated prior to induction Oxygen Delivery Method: Circle System Utilized Preoxygenation: Pre-oxygenation with 100% oxygen Induction Type: IV induction Ventilation: Mask ventilation without difficulty Laryngoscope Size: Mac and 4 Grade View: Grade I Tube type: Oral Tube size: 8.0 mm Number of attempts: 1 Airway Equipment and Method: Stylet and Oral airway Placement Confirmation: ETT inserted through vocal cords under direct vision,  positive ETCO2 and breath sounds checked- equal and bilateral Tube secured with: Tape Dental Injury: Teeth and Oropharynx as per pre-operative assessment

## 2019-06-07 ENCOUNTER — Ambulatory Visit (INDEPENDENT_AMBULATORY_CARE_PROVIDER_SITE_OTHER): Payer: No Typology Code available for payment source | Admitting: Ophthalmology

## 2019-06-07 ENCOUNTER — Encounter (INDEPENDENT_AMBULATORY_CARE_PROVIDER_SITE_OTHER): Payer: Self-pay | Admitting: Ophthalmology

## 2019-06-07 DIAGNOSIS — H33321 Round hole, right eye: Secondary | ICD-10-CM

## 2019-06-07 DIAGNOSIS — H25813 Combined forms of age-related cataract, bilateral: Secondary | ICD-10-CM

## 2019-06-07 DIAGNOSIS — E119 Type 2 diabetes mellitus without complications: Secondary | ICD-10-CM

## 2019-06-07 DIAGNOSIS — H3322 Serous retinal detachment, left eye: Secondary | ICD-10-CM

## 2019-06-07 DIAGNOSIS — H35033 Hypertensive retinopathy, bilateral: Secondary | ICD-10-CM

## 2019-06-07 DIAGNOSIS — I10 Essential (primary) hypertension: Secondary | ICD-10-CM

## 2019-06-07 DIAGNOSIS — H3581 Retinal edema: Secondary | ICD-10-CM

## 2019-06-07 DIAGNOSIS — H35411 Lattice degeneration of retina, right eye: Secondary | ICD-10-CM
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
## 2019-06-07 NOTE — Anesthesia Postprocedure Evaluation (Signed)
Anesthesia Post Note  Patient: [name]  Procedure(s) Performed: PARS PLANA VITRECTOMY WITH 25 GAUGE (Left Eye) Laser Retinopexy via Indirect Opthalmoscopy (Right Eye) Photocoagulation (Left Eye) Gas/Fluid Exchange (Left Eye)     Patient location during evaluation: PACU Anesthesia Type: General Level of consciousness: awake and alert Pain management: pain level controlled Vital Signs Assessment: post-procedure vital signs reviewed and stable Respiratory status: spontaneous breathing, nonlabored ventilation, respiratory function stable and patient connected to nasal cannula oxygen Cardiovascular status: blood pressure returned to baseline and stable Postop Assessment: no apparent nausea or vomiting Anesthetic complications: no    Last Vitals:  Vitals:   06/06/19 1911 06/06/19 1920  BP: (!) 148/78 134/77  Pulse: 83 83  Resp: 14 13  Temp:  37.3 C  SpO2: 95% 94%    Last Pain:  Vitals:   06/06/19 1920  TempSrc:   PainSc: Asleep                 Isador Castille COKER

## 2019-06-08 ENCOUNTER — Encounter (INDEPENDENT_AMBULATORY_CARE_PROVIDER_SITE_OTHER): Payer: Self-pay | Admitting: Ophthalmology

## 2019-06-11 ENCOUNTER — Other Ambulatory Visit (INDEPENDENT_AMBULATORY_CARE_PROVIDER_SITE_OTHER): Payer: Self-pay

## 2019-06-11 MED ORDER — PREDNISOLONE ACETATE 1 % OP SUSP: 1.0000 [drp] | OPHTHALMIC | 0 refills | Status: DC

## 2019-06-11 MED ORDER — OFLOXACIN 0.3 % OP SOLN: 1.0000 [drp] | Freq: Four times a day (QID) | OPHTHALMIC | 0 refills | Status: DC

## 2019-06-11 MED ORDER — BACITRACIN-POLYMYXIN B 500-10000 UNIT/GM OP OINT: TOPICAL_OINTMENT | Freq: Four times a day (QID) | OPHTHALMIC | 3 refills | Status: AC

## 2019-06-11 MED FILL — PREDNISOLONE AC 1% EYE DROP: 1 | 25 days supply | Qty: 15 | Fill #0

## 2019-06-11 MED FILL — BACITRACIN-POLYMYXIN EYE OI: 500-10000 | 10 days supply | Qty: 4 | Fill #0

## 2019-06-11 MED FILL — OFLOXACIN 0.3% EYE DROPS: 0.3 | 25 days supply | Qty: 5 | Fill #0
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
## 2019-06-14 ENCOUNTER — Encounter (INDEPENDENT_AMBULATORY_CARE_PROVIDER_SITE_OTHER): Payer: Self-pay | Admitting: Ophthalmology

## 2019-06-14 ENCOUNTER — Ambulatory Visit (INDEPENDENT_AMBULATORY_CARE_PROVIDER_SITE_OTHER): Payer: No Typology Code available for payment source | Admitting: Ophthalmology

## 2019-06-14 DIAGNOSIS — H3581 Retinal edema: Secondary | ICD-10-CM

## 2019-06-14 DIAGNOSIS — H35411 Lattice degeneration of retina, right eye: Secondary | ICD-10-CM

## 2019-06-14 DIAGNOSIS — H33321 Round hole, right eye: Secondary | ICD-10-CM

## 2019-06-14 DIAGNOSIS — H3322 Serous retinal detachment, left eye: Secondary | ICD-10-CM

## 2019-06-14 DIAGNOSIS — H25813 Combined forms of age-related cataract, bilateral: Secondary | ICD-10-CM

## 2019-06-14 DIAGNOSIS — I10 Essential (primary) hypertension: Secondary | ICD-10-CM

## 2019-06-14 DIAGNOSIS — E119 Type 2 diabetes mellitus without complications: Secondary | ICD-10-CM

## 2019-06-14 DIAGNOSIS — H35033 Hypertensive retinopathy, bilateral: Secondary | ICD-10-CM
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
## 2019-06-18 ENCOUNTER — Encounter: Payer: Self-pay | Admitting: Family Medicine

## 2019-06-18 ENCOUNTER — Ambulatory Visit (INDEPENDENT_AMBULATORY_CARE_PROVIDER_SITE_OTHER): Payer: No Typology Code available for payment source | Admitting: Ophthalmology

## 2019-06-18 ENCOUNTER — Other Ambulatory Visit: Payer: Self-pay

## 2019-06-18 ENCOUNTER — Encounter (INDEPENDENT_AMBULATORY_CARE_PROVIDER_SITE_OTHER): Payer: Self-pay | Admitting: Ophthalmology

## 2019-06-18 ENCOUNTER — Ambulatory Visit (INDEPENDENT_AMBULATORY_CARE_PROVIDER_SITE_OTHER): Payer: No Typology Code available for payment source | Admitting: Family Medicine

## 2019-06-18 VITALS — BP 128/80 | HR 88 | Temp 97.2°F | Resp 16 | Ht 74.0 in | Wt 224.0 lb

## 2019-06-18 DIAGNOSIS — E119 Type 2 diabetes mellitus without complications: Secondary | ICD-10-CM

## 2019-06-18 DIAGNOSIS — H25813 Combined forms of age-related cataract, bilateral: Secondary | ICD-10-CM

## 2019-06-18 DIAGNOSIS — H3581 Retinal edema: Secondary | ICD-10-CM

## 2019-06-18 DIAGNOSIS — H33321 Round hole, right eye: Secondary | ICD-10-CM

## 2019-06-18 DIAGNOSIS — E7800 Pure hypercholesterolemia, unspecified: Secondary | ICD-10-CM | POA: Diagnosis not present

## 2019-06-18 DIAGNOSIS — H35411 Lattice degeneration of retina, right eye: Secondary | ICD-10-CM

## 2019-06-18 DIAGNOSIS — I2510 Atherosclerotic heart disease of native coronary artery without angina pectoris: Secondary | ICD-10-CM | POA: Diagnosis not present

## 2019-06-18 DIAGNOSIS — H3322 Serous retinal detachment, left eye: Secondary | ICD-10-CM

## 2019-06-18 DIAGNOSIS — H35033 Hypertensive retinopathy, bilateral: Secondary | ICD-10-CM

## 2019-06-18 DIAGNOSIS — I10 Essential (primary) hypertension: Secondary | ICD-10-CM | POA: Diagnosis not present
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
## 2019-06-19 LAB — CBC WITH DIFFERENTIAL/PLATELET
Absolute Monocytes: 792 cells/uL (ref 200–950)
Basophils Absolute: 73 cells/uL (ref 0–200)
Basophils Relative: 1.1 %
Eosinophils Absolute: 172 cells/uL (ref 15–500)
Eosinophils Relative: 2.6 %
HCT: 43.5 % (ref 38.5–50.0)
Hemoglobin: 13.8 g/dL (ref 13.2–17.1)
Lymphs Abs: 1214 cells/uL (ref 850–3900)
MCH: 23.8 pg — ABNORMAL LOW (ref 27.0–33.0)
MCHC: 31.7 g/dL — ABNORMAL LOW (ref 32.0–36.0)
MCV: 74.9 fL — ABNORMAL LOW (ref 80.0–100.0)
MPV: 9.9 fL (ref 7.5–12.5)
Monocytes Relative: 12 %
Neutro Abs: 4349 cells/uL (ref 1500–7800)
Neutrophils Relative %: 65.9 %
Platelets: 356 10*3/uL (ref 140–400)
RBC: 5.81 10*6/uL — ABNORMAL HIGH (ref 4.20–5.80)
RDW: 16 % — ABNORMAL HIGH (ref 11.0–15.0)
Total Lymphocyte: 18.4 %
WBC: 6.6 10*3/uL (ref 3.8–10.8)

## 2019-06-19 LAB — COMPLETE METABOLIC PANEL WITH GFR
AG Ratio: 1.6 (calc) (ref 1.0–2.5)
ALT: 29 U/L (ref 9–46)
AST: 23 U/L (ref 10–35)
Albumin: 4.2 g/dL (ref 3.6–5.1)
Alkaline phosphatase (APISO): 53 U/L (ref 35–144)
BUN: 16 mg/dL (ref 7–25)
CO2: 23 mmol/L (ref 20–32)
Calcium: 9.9 mg/dL (ref 8.6–10.3)
Chloride: 105 mmol/L (ref 98–110)
Creat: 0.93 mg/dL (ref 0.70–1.25)
GFR, Est African American: 103 mL/min/{1.73_m2} (ref >=60)
GFR, Est Non African American: 89 mL/min/{1.73_m2} (ref >=60)
Globulin: 2.6 g/dL (calc) (ref 1.9–3.7)
Glucose, Bld: 94 mg/dL (ref 65–99)
Potassium: 4.5 mmol/L (ref 3.5–5.3)
Sodium: 139 mmol/L (ref 135–146)
Total Bilirubin: 0.6 mg/dL (ref 0.2–1.2)
Total Protein: 6.8 g/dL (ref 6.1–8.1)

## 2019-06-19 LAB — LIPID PANEL
Cholesterol: 164 mg/dL (ref <200)
HDL: 54 mg/dL (ref >=40)
LDL Cholesterol (Calc): 87 mg/dL (calc)
Non-HDL Cholesterol (Calc): 110 mg/dL (calc) (ref <130)
Total CHOL/HDL Ratio: 3 (calc) (ref <5.0)
Triglycerides: 129 mg/dL (ref <150)

## 2019-06-19 LAB — MICROALBUMIN, URINE: Microalb, Ur: 3.9 mg/dL

## 2019-06-19 LAB — HEMOGLOBIN A1C
Hgb A1c MFr Bld: 7.6 % of total Hgb — ABNORMAL HIGH (ref <5.7)
Mean Plasma Glucose: 171 (calc)
eAG (mmol/L): 9.5 (calc)

## 2019-06-20 ENCOUNTER — Ambulatory Visit (INDEPENDENT_AMBULATORY_CARE_PROVIDER_SITE_OTHER): Payer: No Typology Code available for payment source | Admitting: Ophthalmology

## 2019-06-20 ENCOUNTER — Encounter (INDEPENDENT_AMBULATORY_CARE_PROVIDER_SITE_OTHER): Payer: Self-pay | Admitting: Ophthalmology

## 2019-06-20 ENCOUNTER — Encounter: Payer: Self-pay | Admitting: Family Medicine

## 2019-06-20 ENCOUNTER — Other Ambulatory Visit: Payer: Self-pay

## 2019-06-20 DIAGNOSIS — H35411 Lattice degeneration of retina, right eye: Secondary | ICD-10-CM

## 2019-06-20 DIAGNOSIS — H3581 Retinal edema: Secondary | ICD-10-CM

## 2019-06-20 DIAGNOSIS — I10 Essential (primary) hypertension: Secondary | ICD-10-CM

## 2019-06-20 DIAGNOSIS — H35033 Hypertensive retinopathy, bilateral: Secondary | ICD-10-CM

## 2019-06-20 DIAGNOSIS — H3322 Serous retinal detachment, left eye: Secondary | ICD-10-CM

## 2019-06-20 DIAGNOSIS — H33321 Round hole, right eye: Secondary | ICD-10-CM

## 2019-06-20 DIAGNOSIS — E119 Type 2 diabetes mellitus without complications: Secondary | ICD-10-CM

## 2019-06-20 DIAGNOSIS — H25813 Combined forms of age-related cataract, bilateral: Secondary | ICD-10-CM

## 2019-06-21 ENCOUNTER — Encounter (HOSPITAL_COMMUNITY): Payer: Self-pay
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
## 2019-06-24 ENCOUNTER — Encounter (INDEPENDENT_AMBULATORY_CARE_PROVIDER_SITE_OTHER): Payer: Self-pay | Admitting: Ophthalmology

## 2019-06-24 ENCOUNTER — Other Ambulatory Visit (INDEPENDENT_AMBULATORY_CARE_PROVIDER_SITE_OTHER): Payer: Self-pay | Admitting: Ophthalmology

## 2019-06-24 ENCOUNTER — Ambulatory Visit (INDEPENDENT_AMBULATORY_CARE_PROVIDER_SITE_OTHER): Payer: No Typology Code available for payment source | Admitting: Ophthalmology

## 2019-06-24 ENCOUNTER — Telehealth (HOSPITAL_COMMUNITY): Payer: Self-pay | Admitting: Cardiology

## 2019-06-24 DIAGNOSIS — H35033 Hypertensive retinopathy, bilateral: Secondary | ICD-10-CM

## 2019-06-24 DIAGNOSIS — E119 Type 2 diabetes mellitus without complications: Secondary | ICD-10-CM

## 2019-06-24 DIAGNOSIS — H3322 Serous retinal detachment, left eye: Secondary | ICD-10-CM

## 2019-06-24 DIAGNOSIS — I10 Essential (primary) hypertension: Secondary | ICD-10-CM

## 2019-06-24 DIAGNOSIS — H3581 Retinal edema: Secondary | ICD-10-CM

## 2019-06-24 DIAGNOSIS — H33321 Round hole, right eye: Secondary | ICD-10-CM

## 2019-06-24 DIAGNOSIS — H25813 Combined forms of age-related cataract, bilateral: Secondary | ICD-10-CM

## 2019-06-24 DIAGNOSIS — H35411 Lattice degeneration of retina, right eye: Secondary | ICD-10-CM

## 2019-06-24 MED ORDER — OFLOXACIN 0.3 % OP SOLN: 1.0000 [drp] | Freq: Four times a day (QID) | OPHTHALMIC | 0 refills | Status: DC

## 2019-06-24 MED ORDER — BACITRACIN-POLYMYXIN B 500-10000 UNIT/GM OP OINT: TOPICAL_OINTMENT | OPHTHALMIC | 4 refills | Status: DC | PRN

## 2019-06-24 MED FILL — BACITRACIN-POLYMYXIN EYE OI: 500-10000 | 14 days supply | Qty: 4 | Fill #0

## 2019-06-24 NOTE — Telephone Encounter (Signed)
Per staff message from [name], RN, called to schedule f/u appt for pt with Dr. Shirlee Latch.  Left message for patient to call back to schedule his appt.

## 2019-06-25 ENCOUNTER — Telehealth (HOSPITAL_COMMUNITY): Payer: Self-pay | Admitting: Vascular Surgery

## 2019-06-25 NOTE — Telephone Encounter (Signed)
Pt left VM on scheduling line, returned pt call w/ no answer, left pt VM to call back to the office
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
## 2019-06-27 ENCOUNTER — Ambulatory Visit (INDEPENDENT_AMBULATORY_CARE_PROVIDER_SITE_OTHER): Payer: No Typology Code available for payment source | Admitting: Ophthalmology

## 2019-06-27 ENCOUNTER — Encounter (INDEPENDENT_AMBULATORY_CARE_PROVIDER_SITE_OTHER): Payer: Self-pay | Admitting: Ophthalmology

## 2019-06-27 ENCOUNTER — Other Ambulatory Visit: Payer: Self-pay

## 2019-06-27 DIAGNOSIS — H3322 Serous retinal detachment, left eye: Secondary | ICD-10-CM

## 2019-06-27 DIAGNOSIS — H25813 Combined forms of age-related cataract, bilateral: Secondary | ICD-10-CM

## 2019-06-27 DIAGNOSIS — E119 Type 2 diabetes mellitus without complications: Secondary | ICD-10-CM

## 2019-06-27 DIAGNOSIS — H35411 Lattice degeneration of retina, right eye: Secondary | ICD-10-CM

## 2019-06-27 DIAGNOSIS — H35033 Hypertensive retinopathy, bilateral: Secondary | ICD-10-CM

## 2019-06-27 DIAGNOSIS — I10 Essential (primary) hypertension: Secondary | ICD-10-CM

## 2019-06-27 DIAGNOSIS — H3581 Retinal edema: Secondary | ICD-10-CM

## 2019-06-27 DIAGNOSIS — H33321 Round hole, right eye: Secondary | ICD-10-CM

## 2019-07-01 MED FILL — PREDNISOLONE AC 1% EYE DROP: 1 | 25 days supply | Qty: 15 | Fill #0

## 2019-07-02 MED FILL — OFLOXACIN 0.3% EYE DROPS: 0.3 | 37 days supply | Qty: 10 | Fill #0

## 2019-07-03 ENCOUNTER — Ambulatory Visit (INDEPENDENT_AMBULATORY_CARE_PROVIDER_SITE_OTHER): Payer: No Typology Code available for payment source | Admitting: Ophthalmology

## 2019-07-03 ENCOUNTER — Encounter (INDEPENDENT_AMBULATORY_CARE_PROVIDER_SITE_OTHER): Payer: Self-pay | Admitting: Ophthalmology

## 2019-07-03 DIAGNOSIS — H3322 Serous retinal detachment, left eye: Secondary | ICD-10-CM

## 2019-07-03 DIAGNOSIS — H3581 Retinal edema: Secondary | ICD-10-CM

## 2019-07-03 DIAGNOSIS — H25813 Combined forms of age-related cataract, bilateral: Secondary | ICD-10-CM

## 2019-07-03 DIAGNOSIS — H35033 Hypertensive retinopathy, bilateral: Secondary | ICD-10-CM

## 2019-07-03 DIAGNOSIS — H33321 Round hole, right eye: Secondary | ICD-10-CM

## 2019-07-03 DIAGNOSIS — H35411 Lattice degeneration of retina, right eye: Secondary | ICD-10-CM

## 2019-07-03 DIAGNOSIS — I10 Essential (primary) hypertension: Secondary | ICD-10-CM

## 2019-07-03 DIAGNOSIS — E119 Type 2 diabetes mellitus without complications: Secondary | ICD-10-CM
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
## 2019-07-04 MED FILL — JANUVIA 100 MG TABLET: 100 | 30 days supply | Qty: 30 | Fill #3

## 2019-07-09 ENCOUNTER — Encounter (INDEPENDENT_AMBULATORY_CARE_PROVIDER_SITE_OTHER): Payer: Self-pay | Admitting: Ophthalmology

## 2019-07-09 ENCOUNTER — Ambulatory Visit (INDEPENDENT_AMBULATORY_CARE_PROVIDER_SITE_OTHER): Payer: No Typology Code available for payment source | Admitting: Ophthalmology

## 2019-07-09 DIAGNOSIS — H25813 Combined forms of age-related cataract, bilateral: Secondary | ICD-10-CM

## 2019-07-09 DIAGNOSIS — E119 Type 2 diabetes mellitus without complications: Secondary | ICD-10-CM

## 2019-07-09 DIAGNOSIS — H33321 Round hole, right eye: Secondary | ICD-10-CM

## 2019-07-09 DIAGNOSIS — H35411 Lattice degeneration of retina, right eye: Secondary | ICD-10-CM

## 2019-07-09 DIAGNOSIS — H3322 Serous retinal detachment, left eye: Secondary | ICD-10-CM

## 2019-07-09 DIAGNOSIS — I10 Essential (primary) hypertension: Secondary | ICD-10-CM

## 2019-07-09 DIAGNOSIS — H3581 Retinal edema: Secondary | ICD-10-CM

## 2019-07-09 DIAGNOSIS — H35033 Hypertensive retinopathy, bilateral: Secondary | ICD-10-CM
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
## 2019-07-10 ENCOUNTER — Encounter (INDEPENDENT_AMBULATORY_CARE_PROVIDER_SITE_OTHER): Payer: No Typology Code available for payment source | Admitting: Ophthalmology

## 2019-07-15 MED FILL — METOPROLOL SUCCINATE ER 25: 25 | 90 days supply | Qty: 270 | Fill #2

## 2019-07-17 ENCOUNTER — Ambulatory Visit (INDEPENDENT_AMBULATORY_CARE_PROVIDER_SITE_OTHER): Payer: No Typology Code available for payment source | Admitting: Ophthalmology

## 2019-07-17 ENCOUNTER — Other Ambulatory Visit: Payer: Self-pay

## 2019-07-17 DIAGNOSIS — H25813 Combined forms of age-related cataract, bilateral: Secondary | ICD-10-CM

## 2019-07-17 DIAGNOSIS — I10 Essential (primary) hypertension: Secondary | ICD-10-CM

## 2019-07-17 DIAGNOSIS — H3581 Retinal edema: Secondary | ICD-10-CM

## 2019-07-17 DIAGNOSIS — H35033 Hypertensive retinopathy, bilateral: Secondary | ICD-10-CM

## 2019-07-17 DIAGNOSIS — H35411 Lattice degeneration of retina, right eye: Secondary | ICD-10-CM

## 2019-07-17 DIAGNOSIS — H33321 Round hole, right eye: Secondary | ICD-10-CM

## 2019-07-17 DIAGNOSIS — H3322 Serous retinal detachment, left eye: Secondary | ICD-10-CM

## 2019-07-17 DIAGNOSIS — E119 Type 2 diabetes mellitus without complications: Secondary | ICD-10-CM
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
## 2019-07-18 ENCOUNTER — Encounter (INDEPENDENT_AMBULATORY_CARE_PROVIDER_SITE_OTHER): Payer: No Typology Code available for payment source | Admitting: Ophthalmology

## 2019-07-18 ENCOUNTER — Encounter (INDEPENDENT_AMBULATORY_CARE_PROVIDER_SITE_OTHER): Payer: Self-pay | Admitting: Ophthalmology

## 2019-07-23 ENCOUNTER — Encounter (INDEPENDENT_AMBULATORY_CARE_PROVIDER_SITE_OTHER): Payer: Self-pay | Admitting: Ophthalmology

## 2019-07-23 ENCOUNTER — Other Ambulatory Visit: Payer: Self-pay

## 2019-07-23 ENCOUNTER — Ambulatory Visit (INDEPENDENT_AMBULATORY_CARE_PROVIDER_SITE_OTHER): Payer: No Typology Code available for payment source | Admitting: Ophthalmology

## 2019-07-23 DIAGNOSIS — H35033 Hypertensive retinopathy, bilateral: Secondary | ICD-10-CM

## 2019-07-23 DIAGNOSIS — H3581 Retinal edema: Secondary | ICD-10-CM

## 2019-07-23 DIAGNOSIS — I10 Essential (primary) hypertension: Secondary | ICD-10-CM

## 2019-07-23 DIAGNOSIS — H25813 Combined forms of age-related cataract, bilateral: Secondary | ICD-10-CM

## 2019-07-23 DIAGNOSIS — H35411 Lattice degeneration of retina, right eye: Secondary | ICD-10-CM

## 2019-07-23 DIAGNOSIS — E119 Type 2 diabetes mellitus without complications: Secondary | ICD-10-CM

## 2019-07-23 DIAGNOSIS — H3322 Serous retinal detachment, left eye: Secondary | ICD-10-CM

## 2019-07-23 DIAGNOSIS — H33321 Round hole, right eye: Secondary | ICD-10-CM

## 2019-07-23 MED ORDER — OFLOXACIN 0.3 % OP SOLN: 1.0000 [drp] | Freq: Four times a day (QID) | OPHTHALMIC | 1 refills | Status: DC
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
## 2019-07-29 ENCOUNTER — Encounter (INDEPENDENT_AMBULATORY_CARE_PROVIDER_SITE_OTHER): Payer: Self-pay | Admitting: Ophthalmology

## 2019-07-29 ENCOUNTER — Other Ambulatory Visit: Payer: Self-pay

## 2019-07-29 ENCOUNTER — Ambulatory Visit (INDEPENDENT_AMBULATORY_CARE_PROVIDER_SITE_OTHER): Payer: No Typology Code available for payment source | Admitting: Ophthalmology

## 2019-07-29 DIAGNOSIS — H3322 Serous retinal detachment, left eye: Secondary | ICD-10-CM

## 2019-07-29 DIAGNOSIS — H3581 Retinal edema: Secondary | ICD-10-CM

## 2019-07-29 DIAGNOSIS — H35411 Lattice degeneration of retina, right eye: Secondary | ICD-10-CM

## 2019-07-29 DIAGNOSIS — H25813 Combined forms of age-related cataract, bilateral: Secondary | ICD-10-CM

## 2019-07-29 DIAGNOSIS — E119 Type 2 diabetes mellitus without complications: Secondary | ICD-10-CM

## 2019-07-29 DIAGNOSIS — I10 Essential (primary) hypertension: Secondary | ICD-10-CM

## 2019-07-29 DIAGNOSIS — H33321 Round hole, right eye: Secondary | ICD-10-CM

## 2019-07-29 DIAGNOSIS — H35033 Hypertensive retinopathy, bilateral: Secondary | ICD-10-CM

## 2019-07-29 MED ORDER — PREDNISONE 10 MG PO TABS: 50.0000 mg | ORAL_TABLET | Freq: Every day | ORAL | 0 refills | Status: DC

## 2019-07-29 MED ORDER — PREDNISOLONE ACETATE 1 % OP SUSP: OPHTHALMIC | 1 refills | Status: DC

## 2019-07-29 MED FILL — PREDNISOLONE AC 1% EYE DROP: 1 | 25 days supply | Qty: 15 | Fill #0

## 2019-07-29 MED FILL — predniSONE 10 MG TABS: 10 | 24 days supply | Qty: 120 | Fill #0

## 2019-07-31 MED FILL — OFLOXACIN 0.3% EYE DROPS: 0.3 | 50 days supply | Qty: 10 | Fill #0
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
## 2019-08-07 ENCOUNTER — Ambulatory Visit (INDEPENDENT_AMBULATORY_CARE_PROVIDER_SITE_OTHER): Payer: No Typology Code available for payment source | Admitting: Ophthalmology

## 2019-08-07 ENCOUNTER — Other Ambulatory Visit: Payer: Self-pay

## 2019-08-07 DIAGNOSIS — I10 Essential (primary) hypertension: Secondary | ICD-10-CM

## 2019-08-07 DIAGNOSIS — H3322 Serous retinal detachment, left eye: Secondary | ICD-10-CM

## 2019-08-07 DIAGNOSIS — H33321 Round hole, right eye: Secondary | ICD-10-CM

## 2019-08-07 DIAGNOSIS — H25813 Combined forms of age-related cataract, bilateral: Secondary | ICD-10-CM

## 2019-08-07 DIAGNOSIS — H35033 Hypertensive retinopathy, bilateral: Secondary | ICD-10-CM

## 2019-08-07 DIAGNOSIS — H3581 Retinal edema: Secondary | ICD-10-CM | POA: Diagnosis not present

## 2019-08-07 DIAGNOSIS — E119 Type 2 diabetes mellitus without complications: Secondary | ICD-10-CM

## 2019-08-07 DIAGNOSIS — H35411 Lattice degeneration of retina, right eye: Secondary | ICD-10-CM
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
## 2019-08-10 ENCOUNTER — Encounter (INDEPENDENT_AMBULATORY_CARE_PROVIDER_SITE_OTHER): Payer: Self-pay | Admitting: Ophthalmology

## 2019-08-12 ENCOUNTER — Other Ambulatory Visit (HOSPITAL_COMMUNITY): Payer: Self-pay | Admitting: Cardiology

## 2019-08-12 ENCOUNTER — Other Ambulatory Visit (HOSPITAL_COMMUNITY): Payer: Self-pay | Admitting: Internal Medicine

## 2019-08-15 ENCOUNTER — Other Ambulatory Visit: Payer: Self-pay

## 2019-08-15 ENCOUNTER — Encounter (INDEPENDENT_AMBULATORY_CARE_PROVIDER_SITE_OTHER): Payer: Self-pay | Admitting: Ophthalmology

## 2019-08-15 ENCOUNTER — Ambulatory Visit (INDEPENDENT_AMBULATORY_CARE_PROVIDER_SITE_OTHER): Payer: No Typology Code available for payment source | Admitting: Ophthalmology

## 2019-08-15 DIAGNOSIS — H3581 Retinal edema: Secondary | ICD-10-CM

## 2019-08-15 DIAGNOSIS — H33321 Round hole, right eye: Secondary | ICD-10-CM

## 2019-08-15 DIAGNOSIS — E119 Type 2 diabetes mellitus without complications: Secondary | ICD-10-CM

## 2019-08-15 DIAGNOSIS — H35033 Hypertensive retinopathy, bilateral: Secondary | ICD-10-CM

## 2019-08-15 DIAGNOSIS — H3322 Serous retinal detachment, left eye: Secondary | ICD-10-CM

## 2019-08-15 DIAGNOSIS — H25813 Combined forms of age-related cataract, bilateral: Secondary | ICD-10-CM

## 2019-08-15 DIAGNOSIS — H35411 Lattice degeneration of retina, right eye: Secondary | ICD-10-CM

## 2019-08-15 DIAGNOSIS — I10 Essential (primary) hypertension: Secondary | ICD-10-CM
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
## 2019-08-28 ENCOUNTER — Other Ambulatory Visit: Payer: Self-pay

## 2019-08-28 ENCOUNTER — Encounter (INDEPENDENT_AMBULATORY_CARE_PROVIDER_SITE_OTHER): Payer: Self-pay | Admitting: Ophthalmology

## 2019-08-28 ENCOUNTER — Ambulatory Visit (INDEPENDENT_AMBULATORY_CARE_PROVIDER_SITE_OTHER): Payer: No Typology Code available for payment source | Admitting: Ophthalmology

## 2019-08-28 DIAGNOSIS — H3322 Serous retinal detachment, left eye: Secondary | ICD-10-CM

## 2019-08-28 DIAGNOSIS — H3581 Retinal edema: Secondary | ICD-10-CM

## 2019-08-28 DIAGNOSIS — H25813 Combined forms of age-related cataract, bilateral: Secondary | ICD-10-CM | POA: Diagnosis not present

## 2019-08-28 DIAGNOSIS — H35033 Hypertensive retinopathy, bilateral: Secondary | ICD-10-CM

## 2019-08-28 DIAGNOSIS — E119 Type 2 diabetes mellitus without complications: Secondary | ICD-10-CM

## 2019-08-28 DIAGNOSIS — I10 Essential (primary) hypertension: Secondary | ICD-10-CM

## 2019-08-28 DIAGNOSIS — H33321 Round hole, right eye: Secondary | ICD-10-CM

## 2019-08-28 DIAGNOSIS — H35411 Lattice degeneration of retina, right eye: Secondary | ICD-10-CM

## 2019-08-28 LAB — IOL BIOMETRY - OU - BOTH EYES
A LENGTH (OD): 26.22 mm
A LENGTH (OS): 25.66 mm

## 2019-08-28 MED ORDER — PREDNISONE 10 MG PO TABS: 20.0000 mg | ORAL_TABLET | Freq: Every day | ORAL | 0 refills | Status: DC

## 2019-08-28 MED FILL — predniSONE 10 MG TABS: 10 | 60 days supply | Qty: 120 | Fill #0

## 2019-08-30 ENCOUNTER — Other Ambulatory Visit (INDEPENDENT_AMBULATORY_CARE_PROVIDER_SITE_OTHER): Payer: Self-pay | Admitting: Ophthalmology

## 2019-08-30 MED ORDER — OFLOXACIN 0.3 % OP SOLN: 1.0000 [drp] | Freq: Four times a day (QID) | OPHTHALMIC | 1 refills | Status: DC

## 2019-08-30 MED FILL — OFLOXACIN 0.3% EYE DROPS: 0.3 | 50 days supply | Qty: 10 | Fill #0
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
## 2019-09-03 ENCOUNTER — Encounter (INDEPENDENT_AMBULATORY_CARE_PROVIDER_SITE_OTHER): Payer: No Typology Code available for payment source | Admitting: Ophthalmology

## 2019-09-03 ENCOUNTER — Other Ambulatory Visit (HOSPITAL_COMMUNITY)
Admission: RE | Admit: 2019-09-03 | Discharge: 2019-09-03 | Disposition: A | Discharge status: Home / Self Care | Payer: No Typology Code available for payment source | Source: Ambulatory Visit | Attending: Ophthalmology | Admitting: Ophthalmology

## 2019-09-03 DIAGNOSIS — Z01812 Encounter for preprocedural laboratory examination: Secondary | ICD-10-CM | POA: Insufficient documentation

## 2019-09-03 DIAGNOSIS — Z20822 Contact with and (suspected) exposure to covid-19: Secondary | ICD-10-CM | POA: Insufficient documentation

## 2019-09-03 LAB — SARS CORONAVIRUS 2 (TAT 6-24 HRS): SARS Coronavirus 2: NEGATIVE

## 2019-09-04 ENCOUNTER — Encounter (HOSPITAL_COMMUNITY): Payer: Self-pay | Admitting: Ophthalmology

## 2019-09-04 ENCOUNTER — Telehealth (INDEPENDENT_AMBULATORY_CARE_PROVIDER_SITE_OTHER): Payer: Self-pay

## 2019-09-04 NOTE — Anesthesia Preprocedure Evaluation (Addendum)
Anesthesia Evaluation  Patient identified by MRN, date of birth, ID band Patient awake    Reviewed: Allergy & Precautions, NPO status , Patient's Chart, lab work & pertinent test results, reviewed documented beta blocker date and time   Airway Mallampati: II  TM Distance: >3 FB Neck ROM: Full    Dental no notable dental hx. (+) Teeth Intact   Pulmonary former smoker,    Pulmonary exam normal breath sounds clear to auscultation       Cardiovascular hypertension, Pt. on medications and Pt. on home beta blockers + CAD, + Past MI, + Cardiac Stents and +CHF  Normal cardiovascular exam Rhythm:Regular Rate:Normal  Hx/o STEMI x 2 Hx/o PCI x 3   Neuro/Psych negative neurological ROS  negative psych ROS   GI/Hepatic Neg liver ROS, GERD  Medicated and Controlled,  Endo/Other  diabetes, Well Controlled, Type 2, Oral Hypoglycemic AgentsHyperlipidemia Obesity  Renal/GU negative Renal ROS  negative genitourinary   Musculoskeletal  (+) Arthritis , Osteoarthritis,    Abdominal (+) + obese,   Peds  Hematology  (+) anemia , Xarelto therapy- last dose 24 hours ago   Anesthesia Other Findings   Reproductive/Obstetrics                            Anesthesia Physical Anesthesia Plan  ASA: III  Anesthesia Plan: General   Post-op Pain Management:    Induction: Intravenous  PONV Risk Score and Plan: 2 and Ondansetron and Treatment may vary due to age or medical condition  Airway Management Planned: Oral ETT  Additional Equipment:   Intra-op Plan:   Post-operative Plan: Extubation in OR  Informed Consent: I have reviewed the patients History and Physical, chart, labs and discussed the procedure including the risks, benefits and alternatives for the proposed anesthesia with the patient or authorized representative who has indicated his/her understanding and acceptance.     Dental advisory  given  Plan Discussed with: CRNA, Anesthesiologist and Surgeon  Anesthesia Plan Comments: (PAT note by Karoline Caldwell, PA-C: Follows with cardiology for hx of CAD s/p NSTEMI in 5/13 and inferior STEMI in 12/14. In 5/13, he had an NSTEMI with BMS to totally occluded distal RCA. In 12/14, he had an inferior STEMI with 95% mid RCA (distal RCA stent patent) at [facility]. He had BMS to [facility] Echo (12/14) showed EF 50-55%.He was admitted with unstable angina in 12/17, had DES to mid LAD 90% stenosis. Peripheral arterial dopplers in 8/18 showed 75-99% mid left femoral artery stenosis.  Echo in 12/18 with EF 55-60%.  Last seen by Dr. Aundra Dubin 07/31/18. Per note, "He is doing well, working full time as a Marine scientist in the [employer] . Main complaint is pain from right knee lateral meniscal tear, plan for surgery when outpatient procedures restart. No chest pain. He notes mild dyspnea carrying bags of soil out in his yard, but otherwise no significant exertional dyspnea. He has not noted claudication. Rare NSAID use. Glucose appears to be relatively controlled."  Last seen by PCP Dr. Jenna Luo 06/18/19. Per note, pt doing well at that time, denied any CP or DOE.   Recently underwent similar ophthalmologic surgery 06/09/73 without complication.  DMII, last A1c 7.6 on 06/18/19.   Will need DOS labs and eval.  EKG 09/27/18: Sinus tachycardia. Rate 103. Otherwise normal ECG  TTE 03/30/17: - Left ventricle: The cavity size was normal. Systolic function was  normal. The estimated ejection fraction was in  the range of 55%  to 60%. Wall motion was normal; there were no regional wall  motion abnormalities. Left ventricular diastolic function  parameters were normal.  - Aortic valve: There was mild regurgitation.  - Left atrium: The atrium was moderately dilated.   Impressions:   - Previously described inferolateral hypokinesis is not seen on the  current study.   Cardiac  Cath 03/30/2016 1. Severe LAD stenosis, treated successfully with PCI using a long drug-eluting stent platform 2. Continued patency of the stented segments in the right coronary artery 3. Severe stenosis of the distal left circumflex very small area of myocardium, appropriate for medical therapy 4. Normal LV systolic function  )       Anesthesia Quick Evaluation

## 2019-09-04 NOTE — Progress Notes (Signed)
Anesthesia Chart Review: Same day workup  Follows with cardiology for hx of CAD s/p NSTEMI in 5/13 and inferior STEMI in 12/14. In 5/13, he had an NSTEMI with BMS to totally occluded distal RCA. In 12/14, he had an inferior STEMI with 95% mid RCA (distal RCA stent patent) at [facility]. He had BMS to [facility]. Echo (12/14) showed EF 50-55%.He was admitted with unstable angina in 12/17, had DES to mid LAD 90% stenosis. Peripheral arterial dopplers in 8/18 showed 75-99% mid left femoral artery stenosis.  Echo in 12/18 with EF 55-60%.  Last seen by Dr. Shirlee Latch 07/31/18. Per note, "He is doing well, working full time as a Engineer, civil (consulting) in the [employer]. Main complaint is pain from right knee lateral meniscal tear, plan for surgery when outpatient procedures restart. No chest pain. He notes mild dyspnea carrying bags of soil out in his yard, but otherwise no significant exertional dyspnea. He has not noted claudication. Rare NSAID use. Glucose appears to be relatively controlled."  Last seen by PCP Dr. Lynnea Ferrier 06/18/19. Per note, pt doing well at that time, denied any CP or DOE.   Recently underwent similar ophthalmologic surgery 06/06/19 without complication.  DMII, last A1c 7.6 on 06/18/19.   Will need DOS labs and eval.  EKG 09/27/18: Sinus tachycardia. Rate 103. Otherwise normal ECG  TTE 03/30/17: - Left ventricle: The cavity size was normal. Systolic function was  normal. The estimated ejection fraction was in the range of 55%  to 60%. Wall motion was normal; there were no regional wall  motion abnormalities. Left ventricular diastolic function  parameters were normal.  - Aortic valve: There was mild regurgitation.  - Left atrium: The atrium was moderately dilated.   Impressions:   - Previously described inferolateral hypokinesis is not seen on the  current study.   Cardiac Cath 03/30/2016 1. Severe LAD stenosis, treated successfully with PCI using a  long drug-eluting stent platform 2. Continued patency of the stented segments in the right coronary artery 3. Severe stenosis of the distal left circumflex very small area of myocardium, appropriate for medical therapy 4. Normal LV systolic function   Zannie Cove [facility] Stay [facility]/Anesthesiology Phone [phone] 09/04/2019 2:38 PM

## 2019-09-04 NOTE — Progress Notes (Signed)
Patient denies shortness of breath, fever, cough or chest pain.  PCP - Dr Tanya Nones Cardiologist - Dr Shirlee Latch  Chest x-ray - n/a EKG - 09/27/18 Stress Test - 03/23/16 ECHO - 03/30/17 Cardiac Cath - 03/30/16  Fasting Blood Sugar - 97-115s Checks Blood Sugar _1 times a day   . Do not take oral diabetes medicines (Jardiance, Glipizide, Metformin, Januvia) the morning of surgery.  . Do not take Jardiance on Wednesday.   . If your blood sugar is less than 70 mg/dL, you will need to treat for low blood sugar: o Treat a low blood sugar (less than 70 mg/dL) with  cup of clear juice (cranberry or apple), 4 glucose tablets, OR glucose gel. o Recheck blood sugar in 15 minutes after treatment (to make sure it is greater than 70 mg/dL). If your blood sugar is not greater than 70 mg/dL on recheck, call [phone] for further instructions.  Blood Thinner Instructions:  Last dose of Xarelto was on 09/03/19.  Aspirin Instructions: Follow your surgeon's instructions on when to stop aspirin prior to surgery,  If no instructions were given by your surgeon then you will need to call the office for those instructions.  Anesthesia review: Yes  STOP now taking any Aspirin (unless otherwise instructed by your surgeon), Aleve, Naproxen, Ibuprofen, Motrin, Advil, Goody's, BC's, all herbal medications, fish oil, and all vitamins.   Coronavirus Screening Covid test 09/03/19 was negative.  Patient verbalized understanding of instructions that were given via phone

## 2019-09-05 ENCOUNTER — Encounter (HOSPITAL_COMMUNITY): Payer: Self-pay | Admitting: Ophthalmology

## 2019-09-05 ENCOUNTER — Ambulatory Visit (HOSPITAL_COMMUNITY): Payer: No Typology Code available for payment source | Admitting: Physician Assistant

## 2019-09-05 ENCOUNTER — Other Ambulatory Visit: Payer: Self-pay

## 2019-09-05 ENCOUNTER — Ambulatory Visit (HOSPITAL_COMMUNITY)
Admission: RE | Admit: 2019-09-05 | Discharge: 2019-09-05 | Disposition: A | Discharge status: Home / Self Care | Payer: No Typology Code available for payment source | Source: Ambulatory Visit | Attending: Ophthalmology | Admitting: Ophthalmology

## 2019-09-05 ENCOUNTER — Encounter (HOSPITAL_COMMUNITY)
Admission: RE | Disposition: A | Discharge status: Home / Self Care | Payer: Self-pay | Source: Ambulatory Visit | Attending: Ophthalmology

## 2019-09-05 DIAGNOSIS — E1136 Type 2 diabetes mellitus with diabetic cataract: Secondary | ICD-10-CM | POA: Diagnosis not present

## 2019-09-05 DIAGNOSIS — Z955 Presence of coronary angioplasty implant and graft: Secondary | ICD-10-CM | POA: Diagnosis not present

## 2019-09-05 DIAGNOSIS — I2510 Atherosclerotic heart disease of native coronary artery without angina pectoris: Secondary | ICD-10-CM | POA: Diagnosis not present

## 2019-09-05 DIAGNOSIS — I252 Old myocardial infarction: Secondary | ICD-10-CM | POA: Diagnosis not present

## 2019-09-05 DIAGNOSIS — Z87891 Personal history of nicotine dependence: Secondary | ICD-10-CM | POA: Diagnosis not present

## 2019-09-05 DIAGNOSIS — Z833 Family history of diabetes mellitus: Secondary | ICD-10-CM | POA: Insufficient documentation

## 2019-09-05 DIAGNOSIS — E113532 Type 2 diabetes mellitus with proliferative diabetic retinopathy with traction retinal detachment not involving the macula, left eye: Secondary | ICD-10-CM | POA: Diagnosis not present

## 2019-09-05 DIAGNOSIS — Z8249 Family history of ischemic heart disease and other diseases of the circulatory system: Secondary | ICD-10-CM | POA: Diagnosis not present

## 2019-09-05 DIAGNOSIS — H25812 Combined forms of age-related cataract, left eye: Secondary | ICD-10-CM | POA: Diagnosis not present

## 2019-09-05 DIAGNOSIS — H338 Other retinal detachments: Secondary | ICD-10-CM | POA: Diagnosis not present

## 2019-09-05 DIAGNOSIS — H35033 Hypertensive retinopathy, bilateral: Secondary | ICD-10-CM | POA: Diagnosis not present

## 2019-09-05 HISTORY — PX: INJECTION OF SILICONE OIL: SHX6422

## 2019-09-05 HISTORY — PX: CATARACT EXTRACTION W/PHACO: SHX586

## 2019-09-05 HISTORY — DX: Essential (primary) hypertension: I10

## 2019-09-05 HISTORY — PX: PHOTOCOAGULATION WITH LASER: SHX6027

## 2019-09-05 HISTORY — PX: MEMBRANE PEEL: SHX5967

## 2019-09-05 HISTORY — PX: VITRECTOMY 25 GAUGE WITH SCLERAL BUCKLE: SHX6183

## 2019-09-05 LAB — CBC
HCT: 42.9 % (ref 39.0–52.0)
Hemoglobin: 13.5 g/dL (ref 13.0–17.0)
MCH: 24.3 pg — ABNORMAL LOW (ref 26.0–34.0)
MCHC: 31.5 g/dL (ref 30.0–36.0)
MCV: 77.3 fL — ABNORMAL LOW (ref 80.0–100.0)
Platelets: 279 10*3/uL (ref 150–400)
RBC: 5.55 MIL/uL (ref 4.22–5.81)
RDW: 16.5 % — ABNORMAL HIGH (ref 11.5–15.5)
WBC: 6.8 10*3/uL (ref 4.0–10.5)
nRBC: 0 % (ref 0.0–0.2)

## 2019-09-05 LAB — BASIC METABOLIC PANEL
Anion gap: 11 (ref 5–15)
BUN: 17 mg/dL (ref 6–20)
CO2: 23 mmol/L (ref 22–32)
Calcium: 8.9 mg/dL (ref 8.9–10.3)
Chloride: 105 mmol/L (ref 98–111)
Creatinine, Ser: 0.96 mg/dL (ref 0.61–1.24)
GFR calc Af Amer: >60 mL/min (ref >60)
GFR calc non Af Amer: >60 mL/min (ref >60)
Glucose, Bld: 210 mg/dL — ABNORMAL HIGH (ref 70–99)
Potassium: 4.3 mmol/L (ref 3.5–5.1)
Sodium: 139 mmol/L (ref 135–145)

## 2019-09-05 LAB — SURGICAL PCR SCREEN
MRSA, PCR: NEGATIVE
Staphylococcus aureus: NEGATIVE

## 2019-09-05 LAB — GLUCOSE, CAPILLARY
Glucose-Capillary: 188 mg/dL — ABNORMAL HIGH (ref 70–99)
Glucose-Capillary: 201 mg/dL — ABNORMAL HIGH (ref 70–99)
Glucose-Capillary: 152 mg/dL — ABNORMAL HIGH (ref 70–99)
Glucose-Capillary: 200 mg/dL — ABNORMAL HIGH (ref 70–99)
Glucose-Capillary: 185 mg/dL — ABNORMAL HIGH (ref 70–99)

## 2019-09-05 SURGERY — VITRECTOMY, USING 25-GAUGE INSTRUMENTS, WITH SCLERAL BUCKLING
Anesthesia: General | Site: Eye | Laterality: Left

## 2019-09-05 MED ORDER — BUPIVACAINE HCL (PF) 0.75 % IJ SOLN
INTRAMUSCULAR | Status: AC
  Filled 2019-09-05: qty 10

## 2019-09-05 MED ORDER — SODIUM HYALURONATE 10 MG/ML IO SOLN
INTRAOCULAR | Status: AC
  Filled 2019-09-05: qty 0.85

## 2019-09-05 MED ORDER — KETOROLAC TROMETHAMINE 0.5 % OP SOLN
1.0000 [drp] | OPHTHALMIC | Status: AC
  Administered 2019-09-05: 1 [drp] via OPHTHALMIC
  Filled 2019-09-05: qty 5

## 2019-09-05 MED ORDER — GATIFLOXACIN 0.5 % OP SOLN OPTIME - NO CHARGE
OPHTHALMIC | Status: DC | PRN
  Administered 2019-09-05: 1 [drp] via OPHTHALMIC

## 2019-09-05 MED ORDER — EPINEPHRINE PF 1 MG/ML IJ SOLN
INTRAMUSCULAR | Status: AC
  Filled 2019-09-05: qty 1

## 2019-09-05 MED ORDER — LIDOCAINE 2% (20 MG/ML) 5 ML SYRINGE
INTRAMUSCULAR | Status: AC
  Filled 2019-09-05: qty 10

## 2019-09-05 MED ORDER — NA CHONDROIT SULF-NA HYALURON 40-30 MG/ML IO SOLN
INTRAOCULAR | Status: DC | PRN
  Administered 2019-09-05 (×6): 0.5 mL via INTRAOCULAR

## 2019-09-05 MED ORDER — BSS IO SOLN
INTRAOCULAR | Status: AC
  Filled 2019-09-05: qty 15

## 2019-09-05 MED ORDER — TRIAMCINOLONE ACETONIDE 40 MG/ML IJ SUSP
INTRAMUSCULAR | Status: AC
  Filled 2019-09-05: qty 5

## 2019-09-05 MED ORDER — PROVISC 10 MG/ML IO SOLN
INTRAOCULAR | Status: DC | PRN
  Administered 2019-09-05: 0.85 mL via INTRAOCULAR

## 2019-09-05 MED ORDER — STERILE WATER FOR INJECTION IJ SOLN
INTRAMUSCULAR | Status: AC
  Filled 2019-09-05: qty 10

## 2019-09-05 MED ORDER — NA CHONDROIT SULF-NA HYALURON 40-30 MG/ML IO SOLN
INTRAOCULAR | Status: AC
  Filled 2019-09-05: qty 1

## 2019-09-05 MED ORDER — FENTANYL CITRATE (PF) 250 MCG/5ML IJ SOLN
INTRAMUSCULAR | Status: AC
  Filled 2019-09-05: qty 5

## 2019-09-05 MED ORDER — BACITRACIN-POLYMYXIN B 500-10000 UNIT/GM OP OINT
TOPICAL_OINTMENT | OPHTHALMIC | Status: AC
  Filled 2019-09-05: qty 3.5

## 2019-09-05 MED ORDER — PREDNISOLONE ACETATE 1 % OP SUSP
OPHTHALMIC | Status: AC
  Filled 2019-09-05: qty 5

## 2019-09-05 MED ORDER — BRIMONIDINE TARTRATE 0.2 % OP SOLN
OPHTHALMIC | Status: DC | PRN
  Administered 2019-09-05: 1 [drp] via OPHTHALMIC

## 2019-09-05 MED ORDER — HYDROCODONE-ACETAMINOPHEN 5-325 MG PO TABS: 1.0000 | ORAL_TABLET | ORAL | 0 refills | Status: DC | PRN

## 2019-09-05 MED ORDER — MIDAZOLAM HCL 5 MG/5ML IJ SOLN
INTRAMUSCULAR | Status: DC | PRN
  Administered 2019-09-05: 2 mg via INTRAVENOUS

## 2019-09-05 MED ORDER — EPINEPHRINE PF 1 MG/ML IJ SOLN
INTRAOCULAR | Status: DC | PRN
  Administered 2019-09-05 (×2): 500 mL

## 2019-09-05 MED ORDER — TRIAMCINOLONE ACETONIDE 40 MG/ML IJ SUSP
INTRAMUSCULAR | Status: DC | PRN
  Administered 2019-09-05: 20 mg

## 2019-09-05 MED ORDER — DORZOLAMIDE HCL-TIMOLOL MAL 2-0.5 % OP SOLN
OPHTHALMIC | Status: AC
  Filled 2019-09-05: qty 10

## 2019-09-05 MED ORDER — CARBACHOL 0.01 % IO SOLN
INTRAOCULAR | Status: AC
  Filled 2019-09-05: qty 1.5

## 2019-09-05 MED ORDER — SODIUM CHLORIDE 0.9 % IV SOLN: INTRAVENOUS | Status: DC

## 2019-09-05 MED ORDER — BRILLIANT BLUE G 0.025 % IO SOSY
PREFILLED_SYRINGE | INTRAOCULAR | Status: DC | PRN
  Administered 2019-09-05: 0.5 mL via INTRAVITREAL

## 2019-09-05 MED ORDER — PREDNISOLONE ACETATE 1 % OP SUSP
OPHTHALMIC | Status: DC | PRN
  Administered 2019-09-05: 1 [drp] via OPHTHALMIC

## 2019-09-05 MED ORDER — GATIFLOXACIN 0.5 % OP SOLN
OPHTHALMIC | Status: AC
  Filled 2019-09-05: qty 2.5

## 2019-09-05 MED ORDER — MUPIROCIN 2 % EX OINT
TOPICAL_OINTMENT | CUTANEOUS | Status: AC
  Administered 2019-09-05: 1 via TOPICAL
  Filled 2019-09-05: qty 22

## 2019-09-05 MED ORDER — PROPOFOL 10 MG/ML IV BOLUS
INTRAVENOUS | Status: AC
  Filled 2019-09-05: qty 20

## 2019-09-05 MED ORDER — STERILE WATER FOR INJECTION IJ SOLN
INTRAMUSCULAR | Status: DC | PRN
  Administered 2019-09-05: 20 mL

## 2019-09-05 MED ORDER — BACITRACIN-POLYMYXIN B 500-10000 UNIT/GM OP OINT
TOPICAL_OINTMENT | OPHTHALMIC | Status: DC | PRN
  Administered 2019-09-05: 1 via OPHTHALMIC

## 2019-09-05 MED ORDER — TROPICAMIDE 1 % OP SOLN
1.0000 [drp] | OPHTHALMIC | Status: AC | PRN
  Administered 2019-09-05 (×3): 1 [drp] via OPHTHALMIC
  Filled 2019-09-05: qty 15

## 2019-09-05 MED ORDER — ONDANSETRON HCL 4 MG/2ML IJ SOLN
INTRAMUSCULAR | Status: AC
  Filled 2019-09-05: qty 2

## 2019-09-05 MED ORDER — BUPIVACAINE HCL (PF) 0.75 % IJ SOLN
INTRAMUSCULAR | Status: DC | PRN
  Administered 2019-09-05: 5 mL

## 2019-09-05 MED ORDER — LIDOCAINE 2% (20 MG/ML) 5 ML SYRINGE
INTRAMUSCULAR | Status: DC | PRN
  Administered 2019-09-05: 80 mg via INTRAVENOUS

## 2019-09-05 MED ORDER — CEFTAZIDIME 1 G IJ SOLR
INTRAMUSCULAR | Status: AC
  Filled 2019-09-05: qty 1

## 2019-09-05 MED ORDER — NA CHONDROIT SULF-NA HYALURON 40-30 MG/ML IO SOLN
INTRAOCULAR | Status: AC
  Filled 2019-09-05: qty 0.5

## 2019-09-05 MED ORDER — MIDAZOLAM HCL 2 MG/2ML IJ SOLN
INTRAMUSCULAR | Status: AC
  Filled 2019-09-05: qty 2

## 2019-09-05 MED ORDER — PHENYLEPHRINE 40 MCG/ML (10ML) SYRINGE FOR IV PUSH (FOR BLOOD PRESSURE SUPPORT)
PREFILLED_SYRINGE | INTRAVENOUS | Status: AC
  Filled 2019-09-05: qty 10

## 2019-09-05 MED ORDER — BALANCED SALT IO SOLN
INTRAOCULAR | Status: DC | PRN
  Administered 2019-09-05 (×4): 15 mL via INTRAOCULAR

## 2019-09-05 MED ORDER — PROPARACAINE HCL 0.5 % OP SOLN
1.0000 [drp] | OPHTHALMIC | Status: AC | PRN
  Administered 2019-09-05 (×3): 1 [drp] via OPHTHALMIC
  Filled 2019-09-05: qty 15

## 2019-09-05 MED ORDER — ORAL CARE MOUTH RINSE: 15.0000 mL | Freq: Once | OROMUCOSAL | Status: AC

## 2019-09-05 MED ORDER — LIDOCAINE HCL (PF) 2 % IJ SOLN
INTRAMUSCULAR | Status: AC
  Filled 2019-09-05: qty 10

## 2019-09-05 MED ORDER — DORZOLAMIDE HCL-TIMOLOL MAL 2-0.5 % OP SOLN
OPHTHALMIC | Status: DC | PRN
  Administered 2019-09-05: 1 [drp] via OPHTHALMIC

## 2019-09-05 MED ORDER — ROCURONIUM BROMIDE 10 MG/ML (PF) SYRINGE
PREFILLED_SYRINGE | INTRAVENOUS | Status: DC | PRN
  Administered 2019-09-05: 20 mg via INTRAVENOUS
  Administered 2019-09-05: 15 mg via INTRAVENOUS
  Administered 2019-09-05: 30 mg via INTRAVENOUS
  Administered 2019-09-05: 50 mg via INTRAVENOUS
  Administered 2019-09-05: 10 mg via INTRAVENOUS
  Administered 2019-09-05: 60 mg via INTRAVENOUS
  Administered 2019-09-05: 20 mg via INTRAVENOUS

## 2019-09-05 MED ORDER — PROPOFOL 10 MG/ML IV BOLUS
INTRAVENOUS | Status: DC | PRN
  Administered 2019-09-05: 180 mg via INTRAVENOUS

## 2019-09-05 MED ORDER — SUGAMMADEX SODIUM 200 MG/2ML IV SOLN
INTRAVENOUS | Status: DC | PRN
  Administered 2019-09-05: 200 mg via INTRAVENOUS

## 2019-09-05 MED ORDER — TRYPAN BLUE 0.15 % OP SOLN
OPHTHALMIC | Status: DC | PRN
  Administered 2019-09-05: 0.5 mL via INTRAVITREAL

## 2019-09-05 MED ORDER — BSS PLUS IO SOLN
INTRAOCULAR | Status: AC
  Filled 2019-09-05: qty 500

## 2019-09-05 MED ORDER — SODIUM CHLORIDE (PF) 0.9 % IJ SOLN
INTRAMUSCULAR | Status: AC
  Filled 2019-09-05: qty 10

## 2019-09-05 MED ORDER — ATROPINE SULFATE 1 % OP SOLN
1.0000 [drp] | OPHTHALMIC | Status: AC | PRN
  Administered 2019-09-05 (×3): 1 [drp] via OPHTHALMIC
  Filled 2019-09-05: qty 5

## 2019-09-05 MED ORDER — BRIMONIDINE TARTRATE 0.2 % OP SOLN
OPHTHALMIC | Status: AC
  Filled 2019-09-05: qty 5

## 2019-09-05 MED ORDER — BSS PLUS IO SOLN
INTRAOCULAR | Status: AC
  Filled 2019-09-05: qty 1000

## 2019-09-05 MED ORDER — STERILE WATER FOR IRRIGATION IR SOLN
Status: DC | PRN
  Administered 2019-09-05: 1000 mL

## 2019-09-05 MED ORDER — FENTANYL CITRATE (PF) 100 MCG/2ML IJ SOLN
INTRAMUSCULAR | Status: DC | PRN
  Administered 2019-09-05: 150 ug via INTRAVENOUS
  Administered 2019-09-05 (×5): 50 ug via INTRAVENOUS

## 2019-09-05 MED ORDER — MUPIROCIN 2 % EX OINT: 1.0000 "application " | TOPICAL_OINTMENT | Freq: Once | CUTANEOUS | Status: AC

## 2019-09-05 MED ORDER — DEXAMETHASONE SODIUM PHOSPHATE 10 MG/ML IJ SOLN
INTRAMUSCULAR | Status: DC | PRN
  Administered 2019-09-05: 4 mg via INTRAVENOUS

## 2019-09-05 MED ORDER — CHLORHEXIDINE GLUCONATE 0.12 % MT SOLN
15.0000 mL | Freq: Once | OROMUCOSAL | Status: AC
  Administered 2019-09-05: 15 mL via OROMUCOSAL
  Filled 2019-09-05: qty 15

## 2019-09-05 MED ORDER — POLYMYXIN B SULFATE 500000 UNITS IJ SOLR
INTRAMUSCULAR | Status: AC
  Filled 2019-09-05: qty 500000

## 2019-09-05 MED ORDER — PHENYLEPHRINE HCL 10 % OP SOLN
1.0000 [drp] | OPHTHALMIC | Status: AC | PRN
  Administered 2019-09-05 (×3): 1 [drp] via OPHTHALMIC
  Filled 2019-09-05: qty 5

## 2019-09-05 MED ORDER — ROCURONIUM BROMIDE 10 MG/ML (PF) SYRINGE
PREFILLED_SYRINGE | INTRAVENOUS | Status: AC
  Filled 2019-09-05: qty 30

## 2019-09-05 MED ORDER — DEXAMETHASONE SODIUM PHOSPHATE 10 MG/ML IJ SOLN
INTRAMUSCULAR | Status: AC
  Filled 2019-09-05: qty 1

## 2019-09-05 MED ORDER — ONDANSETRON HCL 4 MG/2ML IJ SOLN
INTRAMUSCULAR | Status: DC | PRN
  Administered 2019-09-05: 4 mg via INTRAVENOUS

## 2019-09-05 MED ORDER — LACTATED RINGERS IV SOLN: INTRAVENOUS | Status: DC | PRN

## 2019-09-05 MED FILL — HYDROCODON-APAP 5-325: 5-325 | 3 days supply | Qty: 20 | Fill #0

## 2019-09-05 SURGICAL SUPPLY — 75 items
APPLICATOR COTTON TIP 6 STRL (MISCELLANEOUS) ×10 IMPLANT
APPLICATOR COTTON TIP 6IN STRL (MISCELLANEOUS) ×8
BAND SCLERAL BUCKLING TYPE 42 (Ophthalmic Related) ×1 IMPLANT
BETADINE 5% OPHTHALMIC (OPHTHALMIC) ×2 IMPLANT
BLADE KERATOME 2.75 (BLADE) ×2 IMPLANT
BLADE MVR KNIFE 20G (BLADE) ×1 IMPLANT
BLADE NDL 3 SS STRL (BLADE) ×1 IMPLANT
BLADE NEEDLE 3 SS STRL (BLADE) ×2 IMPLANT
BNDG EYE OVAL (GAUZE/BANDAGES/DRESSINGS) ×2 IMPLANT
CABLE BIPOLOR RESECTION CORD (MISCELLANEOUS) ×2 IMPLANT
CANNULA ANT/CHMB 27G (MISCELLANEOUS) ×1 IMPLANT
CANNULA ANT/CHMB 27GA (MISCELLANEOUS) ×6 IMPLANT
CANNULA FLEX TIP 25G (CANNULA) ×2 IMPLANT
CANNULA IRRIGAT CYSTOTOME (MISCELLANEOUS) IMPLANT
CLSR STERI-STRIP ANTIMIC 1/2X4 (GAUZE/BANDAGES/DRESSINGS) ×2 IMPLANT
COVER SURGICAL LIGHT HANDLE (MISCELLANEOUS) ×2 IMPLANT
DRAPE MICROSCOPE LEICA 46X105 (MISCELLANEOUS) ×2 IMPLANT
DRAPE OPHTHALMIC 77X100 STRL (CUSTOM PROCEDURE TRAY) ×2 IMPLANT
FORCEPS GRIESHABER ILM 25G A (INSTRUMENTS) ×1 IMPLANT
FORCEPS GRIESHABER MAX 25G (MISCELLANEOUS) ×1 IMPLANT
GAUZE SPONGE 4X4 12PLY STRL (GAUZE/BANDAGES/DRESSINGS) ×1 IMPLANT
GLOVE BIO SURGEON STRL SZ7.5 (GLOVE) ×4 IMPLANT
GLOVE BIOGEL M 7.0 STRL (GLOVE) ×2 IMPLANT
GLOVE SURG SS PI 6.0 STRL IVOR (GLOVE) ×3 IMPLANT
GLOVE SURG SS PI 7.0 STRL IVOR (GLOVE) ×3 IMPLANT
GOWN STRL REUS W/ TWL LRG LVL3 (GOWN DISPOSABLE) ×2 IMPLANT
GOWN STRL REUS W/ TWL XL LVL3 (GOWN DISPOSABLE) ×1 IMPLANT
GOWN STRL REUS W/TWL LRG LVL3 (GOWN DISPOSABLE) ×5 IMPLANT
GOWN STRL REUS W/TWL XL LVL3 (GOWN DISPOSABLE) ×2
IRRIGAT CYSTOTOME (MISCELLANEOUS) ×2 IMPLANT
KIT BASIN OR (CUSTOM PROCEDURE TRAY) ×2 IMPLANT
KIT IRRIGAT 0.9 MICROSMOOTH (MISCELLANEOUS) ×1 IMPLANT
KNIFE GRIESHABER SHARP 2.5MM (MISCELLANEOUS) ×2 IMPLANT
LENS VITRECTOMY FLAT OCLR DISP (MISCELLANEOUS) ×1 IMPLANT
LOOP FINESSE 25 GA (MISCELLANEOUS) ×1 IMPLANT
NDL 18GX1X1/2 (RX/OR ONLY) (NEEDLE) ×1 IMPLANT
NDL 25GX 5/8IN NON SAFETY (NEEDLE) ×4 IMPLANT
NDL HYPO 30X.5 LL (NEEDLE) ×2 IMPLANT
NEEDLE 18GX1X1/2 (RX/OR ONLY) (NEEDLE) ×6 IMPLANT
NEEDLE 25GX 5/8IN NON SAFETY (NEEDLE) ×10 IMPLANT
NEEDLE HYPO 30X.5 LL (NEEDLE) ×4 IMPLANT
NS IRRIG 1000ML POUR BTL (IV SOLUTION) ×2 IMPLANT
OIL SILICONE OPHTHALMIC 1000 (Ophthalmic Related) ×1 IMPLANT
OPHTHALMIC BETADINE 5% (OPHTHALMIC) ×2
PACK VITRECTOMY CUSTOM (CUSTOM PROCEDURE TRAY) ×2 IMPLANT
PAD ARMBOARD 7.5X6 YLW CONV (MISCELLANEOUS) ×4 IMPLANT
PAK PIK CATARACT/RETINA 23GA (OPHTHALMIC) ×2 IMPLANT
PENCIL BIPOLAR 25GA STR DISP (OPHTHALMIC RELATED) ×2 IMPLANT
PHACO TIP KELMAN 45DEG (TIP) ×1 IMPLANT
PROBE ENDO DIATHERMY 25G (MISCELLANEOUS) ×1 IMPLANT
PROBE LASER ILLUM FLEX CVD 25G (OPHTHALMIC) ×1 IMPLANT
RETRACTOR IRIS FLEX 25G GRIESH (INSTRUMENTS) ×1 IMPLANT
SET INJECTOR OIL FLUID CONSTEL (OPHTHALMIC) ×1 IMPLANT
SHIELD EYE LENSE ONLY DISP (GAUZE/BANDAGES/DRESSINGS) ×1 IMPLANT
SLEEVE SCLERAL BUCK TYPE 70 (Ophthalmic Related) ×2 IMPLANT
SUT ETHILON 10 0 CS140 6 (SUTURE) ×2 IMPLANT
SUT ETHILON 5.0 S-24 (SUTURE) ×2 IMPLANT
SUT SILK 2 0 (SUTURE) ×1
SUT SILK 2 0 TIES 17X18 (SUTURE) ×1
SUT SILK 2-0 18XBRD TIE 12 (SUTURE) ×1 IMPLANT
SUT SILK 2-0 18XBRD TIE BLK (SUTURE) ×1 IMPLANT
SUT VICRYL 7 0 TG140 8 (SUTURE) ×2 IMPLANT
SYR 10ML LL (SYRINGE) ×3 IMPLANT
SYR 20ML LL LF (SYRINGE) ×2 IMPLANT
SYR 5ML LL (SYRINGE) ×2 IMPLANT
SYR BULB EAR ULCER 3OZ GRN STR (SYRINGE) ×2 IMPLANT
SYR TB 1ML LUER SLIP (SYRINGE) ×4 IMPLANT
TAPE PAPER 2X10 WHT MICROPORE (GAUZE/BANDAGES/DRESSINGS) ×2 IMPLANT
TAPE SURG TRANSPORE 1 IN (GAUZE/BANDAGES/DRESSINGS) IMPLANT
TAPE SURGICAL TRANSPORE 1 IN (GAUZE/BANDAGES/DRESSINGS) ×1
TIP ABS 45DEG FLARED 0.9MM (TIP) ×2 IMPLANT
TOWEL GREEN STERILE FF (TOWEL DISPOSABLE) ×2 IMPLANT
TRAY FOLEY W/BAG SLVR 14FR (SET/KITS/TRAYS/PACK) ×2 IMPLANT
TUBING HIGH PRESS EXTEN 6IN (TUBING) ×2 IMPLANT
WATER STERILE IRR 1000ML POUR (IV SOLUTION) ×2 IMPLANT

## 2019-09-05 NOTE — Brief Op Note (Signed)
09/05/2019  5:10 PM  PATIENT:  Derrick Todd  59 y.o. male  PRE-OPERATIVE DIAGNOSES:   left eye retinal detachment with proliferative vitreoretinopathy Visually significant cataract  POST-OPERATIVE DIAGNOSES:   same  PROCEDURE:  Procedure(s): 25 GAUGE PARS PLANA VITRECTOMY WITH SCLERAL BUCKLE (Left) MEMBRANE PEEL (Left) CATARACT EXTRACTION PHACO (Left) Photocoagulation With Laser (Left) Injection Of Silicone Oil (Left)  SURGEON:  Surgeon(s) and Role:    Rennis Chris, MD - Primary  ASSISTANTS: Laurian Brim, Ophthalmic Assistant    ANESTHESIA:   local and general  EBL:  5 mL   BLOOD ADMINISTERED:none  DRAINS: none   LOCAL MEDICATIONS USED:  BUPIVICAINE , LIDOCAINE  and Amount: 9 ml  SPECIMEN:  No Specimen  DISPOSITION OF SPECIMEN:  [name]  COUNTS:  YES  TOURNIQUET:  * No tourniquets in log *  DICTATION: .Note written in EPIC  PLAN OF CARE: Discharge to home after PACU  PATIENT DISPOSITION:  PACU - hemodynamically stable.   Delay start of Pharmacological VTE agent (>24hrs) due to surgical blood loss or risk of bleeding: yes

## 2019-09-05 NOTE — Transfer of Care (Signed)
Immediate Anesthesia Transfer of Care Note  Patient: Derrick Todd  Procedure(s) Performed: 25 GAUGE PARS PLANA VITRECTOMY WITH SCLERAL BUCKLE (Left Eye) MEMBRANE PEEL (Left Eye) CATARACT EXTRACTION PHACO (Left Eye) Photocoagulation With Laser (Left Eye) Injection Of Silicone Oil (Left Eye)  Patient Location: PACU  Anesthesia Type:General  Level of Consciousness: drowsy, patient cooperative and responds to stimulation  Airway & Oxygen Therapy: Patient Spontanous Breathing  Post-op Assessment: Report given to RN and Post -op Vital signs reviewed and stable  Post vital signs: Reviewed and stable  Last Vitals:  Vitals Value Taken Time  BP 147/74 09/05/19 1703  Temp    Pulse 107 09/05/19 1710  Resp 22 09/05/19 1710  SpO2 88 % 09/05/19 1710  Vitals shown include unvalidated device data.  Last Pain:  Vitals:   09/05/19 0914  TempSrc:   PainSc: 0-No pain         Complications: No apparent anesthesia complications

## 2019-09-05 NOTE — Interval H&P Note (Signed)
History and Physical Interval Note:  09/05/2019 8:10 AM  Derrick Todd  has presented today for surgery, with the diagnosis of left eye retinal detachment.  The various methods of treatment have been discussed with the patient and family. After consideration of risks, benefits and other options for treatment, the patient has consented to  Procedure(s): VITRECTOMY 25 GAUGE WITH SCLERAL BUCKLE (Left) MEMBRANE PEEL (Left) CATARACT EXTRACTION (Left) as a surgical intervention.  The patient's history has been reviewed, patient examined, no change in status, stable for surgery.  I have reviewed the patient's chart and labs.  Questions were answered to the patient's satisfaction.     Rennis Chris

## 2019-09-05 NOTE — Op Note (Signed)
Date of procedure: 6.3.2021   Attending:  Rennis Chris, M.D., Ph.D.    Assistant: Laurian Brim, Ophthalmic Assistant   Pre-operative Diagnosis:  1. Visually significant cataract, left eye 2. Retinal detachment with proliferative vitreoretinopathy 3. Proliferative diabetic retinopathy, right eye  Post-operative diagnosis:  Same   Anesthesia: General   Procedure: 1)     Cataract extraction via phacoemulsification, Left Eye 2)     Scleral buckle, Left Eye 3)     25 gauge pars plana vitrectomy, Left Eye 4)     TissueBlue-assisted membrane peel, Left Eye 5)     Endolaser photocoagulation, Left Eye 6)     Fluid-Air exchange, Left Eye 7)     1000 cs silicon oil injection, Left Eye   Complications: none Estimated blood loss: minimal Specimens: none   Brief history:    The patient has a history of retinal detachment left eye, s/p pneumatic retinopexy OS 3.1.21, and 25g PPV w/ endolaser and C3F8 gas OS on 3.4.21. Through his post-operative period, the left eye developed proliferative vitreoretinopathy w/ significant preretinal membranes and fibrosis that led to decreased vision in the affected eye. The risks, benefits, and alternatives were explained to the patient, including pain, bleeding, infection, loss of vision, double vision, droopy eyelids, and need for more surgeries.  Informed consent for cataract extraction, scleral buckle and PPV w/ membrane peel and silicon oil injection in the left eye was obtained from the patient and placed in the chart.       Description of Procedure:  The patient was brought to the preoperative holding area where the correct eye was confirmed and marked. The patient was then brought to the operating room where general anesthesia was induced by the Anesthesia team. A time-out was performed to identify the correct patient, eye, procedure, and any allergies. The eye was prepped and draped in the usual sterile ophthalmic fashion followed by placement of a lid  speculum.   First, a 360 conjunctival peritomy was created using Westcott scissors and 0.12 forceps in preparation for the scleral buckle portion of the surgery. Each of the four quadrants between the rectus muscles was dissected using Stevens scissors to detach Tenon's attachments from the globe. Next, attention was turned to the cataract portion of the case. A side port incision was created using a 20g MVR blade. Trypan blue was instilled into the anterior chamber to stain the anterior capsule and then was rinsed with BSS.  Viscoelastic was used to fill the anterior chamber. A keratome was used to fashion a superior triplanar 2.75 mm clear corneal incision. Next, a cystotome and utrata forceps were used to create a continuous curvilinear capsulorhexis. Then, hydrodissection was performed. The lens was then removed using a combination of phacoemulsification and irrigation and aspiration. Of note, the cataract was extremely dense, requiring an elevated CDE, and signifcant corneal edema was developing around the triplanar incision. At this time, the pupil began to come down and the decision was made to introduce iris hooks to keep the pupil dilated--4 were used to open the pupil in a diamond shape. Upon inspection of the capsular bag, a large posterior capsular tear was noted. The decision was made to leave the patient aphakic and proceed with the retinal portion of the surgery. Of note, there was some mild residual cortical remnants superiorly (subincisional) that were unable to be removed due to poor visualization from corneal edema. Viscoelastic was injected into the [facility] to protect the corneal endothelium and the wound was closed with 3x  10-0 nylon interrupted sutures with the knots rotated to bury them.  Attention was then turned to the scleral buckle portion of the operation. . Each of the four rectus muscles was isolated on a muscle hook and slung using 2-0 Silk suture in the usual standard fashion. Each of  the four quadrants between the rectus muscles was inspected and there were noted to be no areas of scleral thinning. A #42silicone band was then brought onto the field and was threaded under each rectus muscle. The band was thenlooselysecured using a #70 Watzke sleeve in the inferotemporalquadrant.The band was then sutured to the sclera in each quadrant using 5-0 nylon sutures passed partial thickness through the sclera in a horizontal mattress fashion. The scleral buckle was thentightened to the appropriate height with two locking needle drivers. Attention was then turnedto the vitrectomy portion of the procedure. A 25gauge trocar was placed in the inferotemporal quadrant in a beveled fashion. A 4 mm infusion cannula was placed through this trocar, and the infusion cannula was confirmed in the vitreous cavity with no incarceration of retina or choroid prior to turning it on. Two additional 25gauge trocars were placed in the superonasal and superotemporal quadrants(2 and 10 oclock, respectively)in a similar beveled fashion. At this time, direct vitrectomy was performed to remove his residual anterior vitreous. Then,a standard three-port pars plana vitrectomy was performed using the light pipe, the cutter, and the BIOM viewing system. Of note, there was minimal residual vitreous following his previous vitrectomy. On inspection, there were focal areas of ERM/preretinal fibrosis from PVR at the distal inferotemporal arcades and inferior to the disc, which were creating significant traction on the retina. The site along the IT arcades was centered around his previous posterior retinotomy.  TissueBlue was then used to stain the internal limiting membrane and counter-stain the ERM/preretinal fibrosis/PVR membranes. A macular contact lens was placed on the eye. A 25g ILM forcep was used to create an opening in the ERM and ILM and then 25g MaxGrip forceps and a finesse loop were used to peel the  ILM and the overlying PVR membranes as much as was safely possible. Of note, the membranes were quite thick and adherent, but we were able to remove a significant portion of the offending membranes. Due to its thickness, the ERM came up as a thick whole sheet. There were some very thick fibrotic membranes that were unable to be peeled and were trimmed down with the vitrectomy cutter. Of note, portions of the ILM were very adherent to the retina, but a very wide peel was achieved and the majority of tractional fibrosis was removed from the retinal surface. The BIOM lens was swung back into position and the floating portions of residual membrane were removed using the vitrectomy probe.  A complete fluid-air exchange was performed with a soft tip extrusion cannula over the inferotemporal retinotomy,then posteriorly. After completion of these maneuvers, the retina was flat over the macula and over the scleral buckle. Under air, endolaser was applied to the retinotomy. Also, an inferior peripheral iridotomy was created with the vitrectomy cutter at 0600. At this time, the buckle height was confirmed and the buckle was finalized by trimming the band ends.Then,viscoelastic was used to fill the [facility] and then [id number] siliconoil wasinjected via the superior nasaltrocar and filled up to the level of the lens capsule plane with the aide of the venting cannula.The superotemporal trocar wasremoved and sutured with 7-0 vicryl in an interrupted fashion.Thesuperonasal trocar andinfusion cannula and associated trocar werethen  removed and sutured with 7-0 vicryl in an interrupted fashion. Kefzol+ polymixinirrigation wasthenused over the buckle. A subtenon's block containing 0.75% marcaine and 2% lidocaine was administered. The conjunctiva was closed with 7-0 vicryl sutures. The eye's intraocular pressurewas confirmed to be at a physiologic level by digital palpation. Subconjunctival injections  of Antibiotic and kenalogwere administered. The lid speculum and drapes were removed. Drops of an antibiotic, antihypertensives, and steroid were given. Copious antibiotic ointment was instilled into the eye. The eye was patched and shielded. The patient tolerated the procedure well without any intraoperative or immediate postoperative complications. The patient was taken to the recovery room in good condition. The patient was instructed to maintain a strict face-down position andwill be seen by Dr. Johnnette Litter in clinic.

## 2019-09-05 NOTE — Discharge Instructions (Signed)
POSTOPERATIVE INSTRUCTIONS  Your doctor has performed vitreoretinal surgery on you at [place].  - Keep eye patched and shielded until seen by Dr. Chantea Surace 8 AM tomorrow in clinic - Do not use drops until return - FACE DOWN POSITIONING WHILE AWAKE - Sleep with belly down or on right side, avoid laying flat on back.    - No strenuous bending, stooping or lifting.  - You may not drive until further notice.  - If your doctor used a gas bubble in your eye during the procedure he will advise you on postoperative positioning. If you have a gas bubble you will be wearing a green bracelet that was applied in the operating room. The green bracelet should stay on as long as the gas bubble is in your eye. While the gas bubble is present you should not fly in an airplane. If you require general anesthesia while the gas bubble is present you must notify your anesthesiologist that an intraocular gas bubble is present so he can take the appropriate precautions.  - Tylenol or any other over-the-counter pain reliever can be used according to your doctor. If more pain medicine is required, your doctor will have a prescription for you.  - You may read, go up and down stairs, and watch television.     Marcelyn Ruppe, M.D., Ph.D.  
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
## 2019-09-05 NOTE — Anesthesia Procedure Notes (Signed)
Procedure Name: Intubation Date/Time: 09/05/2019 11:10 AM Performed by: Inda Coke, CRNA Pre-anesthesia Checklist: Patient identified, Emergency Drugs available, Suction available and Patient being monitored Patient Re-evaluated:Patient Re-evaluated prior to induction Oxygen Delivery Method: Circle System Utilized Preoxygenation: Pre-oxygenation with 100% oxygen Induction Type: IV induction Ventilation: Mask ventilation without difficulty Laryngoscope Size: Mac and 4 Grade View: Grade I Tube type: Oral Tube size: 7.5 mm Number of attempts: 1 Airway Equipment and Method: Stylet and Oral airway Placement Confirmation: ETT inserted through vocal cords under direct vision,  positive ETCO2 and breath sounds checked- equal and bilateral Secured at: 22 cm Tube secured with: Tape Dental Injury: Teeth and Oropharynx as per pre-operative assessment

## 2019-09-06 ENCOUNTER — Encounter (INDEPENDENT_AMBULATORY_CARE_PROVIDER_SITE_OTHER): Payer: No Typology Code available for payment source | Admitting: Ophthalmology

## 2019-09-06 ENCOUNTER — Ambulatory Visit (INDEPENDENT_AMBULATORY_CARE_PROVIDER_SITE_OTHER): Payer: No Typology Code available for payment source | Admitting: Ophthalmology

## 2019-09-06 ENCOUNTER — Encounter (INDEPENDENT_AMBULATORY_CARE_PROVIDER_SITE_OTHER): Payer: Self-pay | Admitting: Ophthalmology

## 2019-09-06 DIAGNOSIS — E119 Type 2 diabetes mellitus without complications: Secondary | ICD-10-CM

## 2019-09-06 DIAGNOSIS — H25811 Combined forms of age-related cataract, right eye: Secondary | ICD-10-CM

## 2019-09-06 DIAGNOSIS — H3322 Serous retinal detachment, left eye: Secondary | ICD-10-CM

## 2019-09-06 DIAGNOSIS — H3581 Retinal edema: Secondary | ICD-10-CM

## 2019-09-06 DIAGNOSIS — H3522 Other non-diabetic proliferative retinopathy, left eye: Secondary | ICD-10-CM

## 2019-09-06 DIAGNOSIS — H35411 Lattice degeneration of retina, right eye: Secondary | ICD-10-CM

## 2019-09-06 DIAGNOSIS — H33321 Round hole, right eye: Secondary | ICD-10-CM

## 2019-09-06 DIAGNOSIS — H2702 Aphakia, left eye: Secondary | ICD-10-CM

## 2019-09-06 DIAGNOSIS — H35033 Hypertensive retinopathy, bilateral: Secondary | ICD-10-CM

## 2019-09-06 DIAGNOSIS — I10 Essential (primary) hypertension: Secondary | ICD-10-CM

## 2019-09-06 NOTE — Anesthesia Postprocedure Evaluation (Signed)
Anesthesia Post Note  Patient: Derrick Todd  Procedure(s) Performed: 25 GAUGE PARS PLANA VITRECTOMY WITH SCLERAL BUCKLE (Left Eye) MEMBRANE PEEL (Left Eye) CATARACT EXTRACTION PHACO (Left Eye) Photocoagulation With Laser (Left Eye) Injection Of Silicone Oil (Left Eye)     Patient location during evaluation: PACU Anesthesia Type: General Level of consciousness: awake and alert Pain management: pain level controlled Vital Signs Assessment: post-procedure vital signs reviewed and stable Respiratory status: spontaneous breathing, nonlabored ventilation and respiratory function stable Cardiovascular status: blood pressure returned to baseline and stable Postop Assessment: no apparent nausea or vomiting Anesthetic complications: no    Last Vitals:  Vitals:   09/05/19 1715 09/05/19 1730  BP: (!) 144/74 (!) 150/84  Pulse: (!) 104 (!) 108  Resp: (!) 26 (!) 22  Temp:  36.7 C  SpO2: 92% 95%    Last Pain:  Vitals:   09/05/19 1730  TempSrc:   PainSc: 0-No pain                 Lucretia Kern
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
## 2019-09-12 ENCOUNTER — Encounter (INDEPENDENT_AMBULATORY_CARE_PROVIDER_SITE_OTHER): Payer: Self-pay | Admitting: Ophthalmology

## 2019-09-12 ENCOUNTER — Other Ambulatory Visit: Payer: Self-pay

## 2019-09-12 ENCOUNTER — Ambulatory Visit (INDEPENDENT_AMBULATORY_CARE_PROVIDER_SITE_OTHER): Payer: No Typology Code available for payment source | Admitting: Ophthalmology

## 2019-09-12 DIAGNOSIS — H2702 Aphakia, left eye: Secondary | ICD-10-CM

## 2019-09-12 DIAGNOSIS — E119 Type 2 diabetes mellitus without complications: Secondary | ICD-10-CM

## 2019-09-12 DIAGNOSIS — H25811 Combined forms of age-related cataract, right eye: Secondary | ICD-10-CM

## 2019-09-12 DIAGNOSIS — H35411 Lattice degeneration of retina, right eye: Secondary | ICD-10-CM

## 2019-09-12 DIAGNOSIS — H3581 Retinal edema: Secondary | ICD-10-CM

## 2019-09-12 DIAGNOSIS — I10 Essential (primary) hypertension: Secondary | ICD-10-CM

## 2019-09-12 DIAGNOSIS — H33321 Round hole, right eye: Secondary | ICD-10-CM

## 2019-09-12 DIAGNOSIS — H3322 Serous retinal detachment, left eye: Secondary | ICD-10-CM

## 2019-09-12 DIAGNOSIS — H3522 Other non-diabetic proliferative retinopathy, left eye: Secondary | ICD-10-CM

## 2019-09-12 DIAGNOSIS — H35033 Hypertensive retinopathy, bilateral: Secondary | ICD-10-CM

## 2019-09-12 MED ORDER — PREDNISOLONE ACETATE 1 % OP SUSP: OPHTHALMIC | 1 refills | Status: DC

## 2019-09-12 MED ORDER — BACITRACIN-POLYMYXIN B 500-10000 UNIT/GM OP OINT: TOPICAL_OINTMENT | OPHTHALMIC | 4 refills | Status: DC | PRN
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
## 2019-09-24 ENCOUNTER — Ambulatory Visit (INDEPENDENT_AMBULATORY_CARE_PROVIDER_SITE_OTHER): Payer: No Typology Code available for payment source | Admitting: Ophthalmology

## 2019-09-24 ENCOUNTER — Other Ambulatory Visit: Payer: Self-pay

## 2019-09-24 ENCOUNTER — Encounter (INDEPENDENT_AMBULATORY_CARE_PROVIDER_SITE_OTHER): Payer: Self-pay | Admitting: Ophthalmology

## 2019-09-24 DIAGNOSIS — H33321 Round hole, right eye: Secondary | ICD-10-CM

## 2019-09-24 DIAGNOSIS — H25811 Combined forms of age-related cataract, right eye: Secondary | ICD-10-CM

## 2019-09-24 DIAGNOSIS — H3581 Retinal edema: Secondary | ICD-10-CM | POA: Diagnosis not present

## 2019-09-24 DIAGNOSIS — I10 Essential (primary) hypertension: Secondary | ICD-10-CM

## 2019-09-24 DIAGNOSIS — H35033 Hypertensive retinopathy, bilateral: Secondary | ICD-10-CM

## 2019-09-24 DIAGNOSIS — H25813 Combined forms of age-related cataract, bilateral: Secondary | ICD-10-CM

## 2019-09-24 DIAGNOSIS — H35411 Lattice degeneration of retina, right eye: Secondary | ICD-10-CM

## 2019-09-24 DIAGNOSIS — H2702 Aphakia, left eye: Secondary | ICD-10-CM

## 2019-09-24 DIAGNOSIS — H3322 Serous retinal detachment, left eye: Secondary | ICD-10-CM

## 2019-09-24 DIAGNOSIS — H3522 Other non-diabetic proliferative retinopathy, left eye: Secondary | ICD-10-CM

## 2019-09-24 DIAGNOSIS — E119 Type 2 diabetes mellitus without complications: Secondary | ICD-10-CM

## 2019-09-27 ENCOUNTER — Encounter (INDEPENDENT_AMBULATORY_CARE_PROVIDER_SITE_OTHER): Payer: Self-pay | Admitting: Ophthalmology
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
## 2019-10-09 ENCOUNTER — Other Ambulatory Visit: Payer: Self-pay

## 2019-10-09 ENCOUNTER — Ambulatory Visit (INDEPENDENT_AMBULATORY_CARE_PROVIDER_SITE_OTHER): Payer: No Typology Code available for payment source | Admitting: Ophthalmology

## 2019-10-09 ENCOUNTER — Encounter (INDEPENDENT_AMBULATORY_CARE_PROVIDER_SITE_OTHER): Payer: Self-pay | Admitting: Ophthalmology

## 2019-10-09 DIAGNOSIS — H3581 Retinal edema: Secondary | ICD-10-CM

## 2019-10-09 DIAGNOSIS — H35033 Hypertensive retinopathy, bilateral: Secondary | ICD-10-CM

## 2019-10-09 DIAGNOSIS — H35411 Lattice degeneration of retina, right eye: Secondary | ICD-10-CM

## 2019-10-09 DIAGNOSIS — H3322 Serous retinal detachment, left eye: Secondary | ICD-10-CM

## 2019-10-09 DIAGNOSIS — E119 Type 2 diabetes mellitus without complications: Secondary | ICD-10-CM

## 2019-10-09 DIAGNOSIS — H25811 Combined forms of age-related cataract, right eye: Secondary | ICD-10-CM

## 2019-10-09 DIAGNOSIS — H2702 Aphakia, left eye: Secondary | ICD-10-CM

## 2019-10-09 DIAGNOSIS — H33321 Round hole, right eye: Secondary | ICD-10-CM

## 2019-10-09 DIAGNOSIS — H3522 Other non-diabetic proliferative retinopathy, left eye: Secondary | ICD-10-CM

## 2019-10-09 DIAGNOSIS — I10 Essential (primary) hypertension: Secondary | ICD-10-CM

## 2019-10-14 MED FILL — PREDNISOLONE AC 1% EYE DROP: 1 | 25 days supply | Qty: 15 | Fill #1

## 2019-10-14 MED FILL — OFLOXACIN 0.3% EYE DROPS: 0.3 | 50 days supply | Qty: 10 | Fill #1

## 2019-10-14 MED FILL — BACITRACIN-POLYMYXIN EYE OI: 500-10000 | 14 days supply | Qty: 4 | Fill #1
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
## 2019-10-28 ENCOUNTER — Other Ambulatory Visit: Payer: Self-pay

## 2019-10-28 ENCOUNTER — Ambulatory Visit (INDEPENDENT_AMBULATORY_CARE_PROVIDER_SITE_OTHER): Payer: No Typology Code available for payment source | Admitting: Ophthalmology

## 2019-10-28 ENCOUNTER — Encounter (INDEPENDENT_AMBULATORY_CARE_PROVIDER_SITE_OTHER): Payer: Self-pay | Admitting: Ophthalmology

## 2019-10-28 DIAGNOSIS — H3322 Serous retinal detachment, left eye: Secondary | ICD-10-CM

## 2019-10-28 DIAGNOSIS — H35411 Lattice degeneration of retina, right eye: Secondary | ICD-10-CM

## 2019-10-28 DIAGNOSIS — H3581 Retinal edema: Secondary | ICD-10-CM

## 2019-10-28 DIAGNOSIS — E119 Type 2 diabetes mellitus without complications: Secondary | ICD-10-CM

## 2019-10-28 DIAGNOSIS — H25811 Combined forms of age-related cataract, right eye: Secondary | ICD-10-CM

## 2019-10-28 DIAGNOSIS — H35033 Hypertensive retinopathy, bilateral: Secondary | ICD-10-CM

## 2019-10-28 DIAGNOSIS — H2702 Aphakia, left eye: Secondary | ICD-10-CM

## 2019-10-28 DIAGNOSIS — I10 Essential (primary) hypertension: Secondary | ICD-10-CM

## 2019-11-01 ENCOUNTER — Encounter: Payer: Self-pay | Admitting: Family Medicine

## 2019-11-02 ENCOUNTER — Encounter (INDEPENDENT_AMBULATORY_CARE_PROVIDER_SITE_OTHER): Payer: Self-pay | Admitting: Ophthalmology

## 2019-11-04 ENCOUNTER — Encounter: Payer: Self-pay | Admitting: Family Medicine

## 2019-11-04 ENCOUNTER — Other Ambulatory Visit: Payer: Self-pay

## 2019-11-04 ENCOUNTER — Ambulatory Visit (INDEPENDENT_AMBULATORY_CARE_PROVIDER_SITE_OTHER): Payer: No Typology Code available for payment source | Admitting: Ophthalmology

## 2019-11-04 ENCOUNTER — Encounter (INDEPENDENT_AMBULATORY_CARE_PROVIDER_SITE_OTHER): Payer: Self-pay | Admitting: Ophthalmology

## 2019-11-04 DIAGNOSIS — H35411 Lattice degeneration of retina, right eye: Secondary | ICD-10-CM

## 2019-11-04 DIAGNOSIS — H33321 Round hole, right eye: Secondary | ICD-10-CM

## 2019-11-04 DIAGNOSIS — H3522 Other non-diabetic proliferative retinopathy, left eye: Secondary | ICD-10-CM

## 2019-11-04 DIAGNOSIS — H3581 Retinal edema: Secondary | ICD-10-CM

## 2019-11-04 DIAGNOSIS — E119 Type 2 diabetes mellitus without complications: Secondary | ICD-10-CM

## 2019-11-04 DIAGNOSIS — H3322 Serous retinal detachment, left eye: Secondary | ICD-10-CM

## 2019-11-04 DIAGNOSIS — H25811 Combined forms of age-related cataract, right eye: Secondary | ICD-10-CM

## 2019-11-04 DIAGNOSIS — H2702 Aphakia, left eye: Secondary | ICD-10-CM

## 2019-11-04 DIAGNOSIS — I10 Essential (primary) hypertension: Secondary | ICD-10-CM

## 2019-11-04 DIAGNOSIS — H16232 Neurotrophic keratoconjunctivitis, left eye: Secondary | ICD-10-CM

## 2019-11-04 DIAGNOSIS — H35033 Hypertensive retinopathy, bilateral: Secondary | ICD-10-CM
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
## 2019-11-06 ENCOUNTER — Ambulatory Visit (INDEPENDENT_AMBULATORY_CARE_PROVIDER_SITE_OTHER): Payer: No Typology Code available for payment source | Admitting: Ophthalmology

## 2019-11-06 ENCOUNTER — Encounter (INDEPENDENT_AMBULATORY_CARE_PROVIDER_SITE_OTHER): Payer: Self-pay | Admitting: Ophthalmology

## 2019-11-06 ENCOUNTER — Other Ambulatory Visit: Payer: Self-pay

## 2019-11-06 DIAGNOSIS — H3322 Serous retinal detachment, left eye: Secondary | ICD-10-CM

## 2019-11-06 DIAGNOSIS — H2702 Aphakia, left eye: Secondary | ICD-10-CM

## 2019-11-06 DIAGNOSIS — H33321 Round hole, right eye: Secondary | ICD-10-CM

## 2019-11-06 DIAGNOSIS — H25811 Combined forms of age-related cataract, right eye: Secondary | ICD-10-CM

## 2019-11-06 DIAGNOSIS — H527 Unspecified disorder of refraction: Secondary | ICD-10-CM

## 2019-11-06 DIAGNOSIS — H16232 Neurotrophic keratoconjunctivitis, left eye: Secondary | ICD-10-CM

## 2019-11-06 DIAGNOSIS — I10 Essential (primary) hypertension: Secondary | ICD-10-CM

## 2019-11-06 DIAGNOSIS — E119 Type 2 diabetes mellitus without complications: Secondary | ICD-10-CM

## 2019-11-06 DIAGNOSIS — H3522 Other non-diabetic proliferative retinopathy, left eye: Secondary | ICD-10-CM

## 2019-11-06 DIAGNOSIS — H35411 Lattice degeneration of retina, right eye: Secondary | ICD-10-CM

## 2019-11-06 DIAGNOSIS — H3581 Retinal edema: Secondary | ICD-10-CM

## 2019-11-06 DIAGNOSIS — H35033 Hypertensive retinopathy, bilateral: Secondary | ICD-10-CM
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
## 2019-11-12 ENCOUNTER — Other Ambulatory Visit (HOSPITAL_COMMUNITY): Payer: Self-pay | Admitting: Cardiology

## 2019-11-12 MED FILL — XARELTO 2.5 MG TABS: 2.5 | 90 days supply | Qty: 180 | Fill #0

## 2019-11-12 MED FILL — JANUVIA 100 MG TABLET: 100 | 30 days supply | Qty: 30 | Fill #5

## 2019-11-12 MED FILL — LISINOPRIL 5 MG TABS: 5 | 90 days supply | Qty: 90 | Fill #0

## 2019-11-12 MED FILL — glipiZIDE ER 10 MG TB24: 10 | 90 days supply | Qty: 90 | Fill #1

## 2019-11-12 MED FILL — metFORMIN HCL 1000 MG TABS: 1000 | 90 days supply | Qty: 180 | Fill #0

## 2019-11-12 MED FILL — JARDIANCE 25 MG TABLET: 25 | 90 days supply | Qty: 90 | Fill #3

## 2019-11-12 MED FILL — ROSUVASTATIN CALCIUM 40 MG: 40 | 90 days supply | Qty: 90 | Fill #0

## 2019-11-12 MED FILL — PANTOPRAZOLE SOD DR 40 MG T: 40 | 90 days supply | Qty: 90 | Fill #0

## 2019-11-13 ENCOUNTER — Encounter (INDEPENDENT_AMBULATORY_CARE_PROVIDER_SITE_OTHER): Payer: No Typology Code available for payment source | Admitting: Ophthalmology

## 2019-11-15 ENCOUNTER — Other Ambulatory Visit (HOSPITAL_COMMUNITY): Payer: Self-pay | Admitting: Cardiology

## 2019-11-18 MED FILL — METOPROLOL SUCCINATE ER 25: 25 | 90 days supply | Qty: 270 | Fill #0
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
## 2019-11-20 ENCOUNTER — Other Ambulatory Visit: Payer: Self-pay

## 2019-11-20 ENCOUNTER — Encounter (INDEPENDENT_AMBULATORY_CARE_PROVIDER_SITE_OTHER): Payer: Self-pay | Admitting: Ophthalmology

## 2019-11-20 ENCOUNTER — Ambulatory Visit (INDEPENDENT_AMBULATORY_CARE_PROVIDER_SITE_OTHER): Payer: No Typology Code available for payment source | Admitting: Ophthalmology

## 2019-11-20 DIAGNOSIS — H35411 Lattice degeneration of retina, right eye: Secondary | ICD-10-CM

## 2019-11-20 DIAGNOSIS — H16232 Neurotrophic keratoconjunctivitis, left eye: Secondary | ICD-10-CM

## 2019-11-20 DIAGNOSIS — I10 Essential (primary) hypertension: Secondary | ICD-10-CM

## 2019-11-20 DIAGNOSIS — H3581 Retinal edema: Secondary | ICD-10-CM | POA: Diagnosis not present

## 2019-11-20 DIAGNOSIS — H2702 Aphakia, left eye: Secondary | ICD-10-CM

## 2019-11-20 DIAGNOSIS — H3522 Other non-diabetic proliferative retinopathy, left eye: Secondary | ICD-10-CM

## 2019-11-20 DIAGNOSIS — H527 Unspecified disorder of refraction: Secondary | ICD-10-CM

## 2019-11-20 DIAGNOSIS — E119 Type 2 diabetes mellitus without complications: Secondary | ICD-10-CM

## 2019-11-20 DIAGNOSIS — H35033 Hypertensive retinopathy, bilateral: Secondary | ICD-10-CM

## 2019-11-20 DIAGNOSIS — H3322 Serous retinal detachment, left eye: Secondary | ICD-10-CM

## 2019-11-20 DIAGNOSIS — H25811 Combined forms of age-related cataract, right eye: Secondary | ICD-10-CM

## 2019-11-20 DIAGNOSIS — H33321 Round hole, right eye: Secondary | ICD-10-CM

## 2019-12-01 ENCOUNTER — Encounter: Payer: Self-pay | Admitting: Family Medicine

## 2019-12-03 ENCOUNTER — Encounter: Payer: Self-pay | Admitting: Family Medicine

## 2019-12-03 ENCOUNTER — Telehealth: Payer: Self-pay | Admitting: Family Medicine

## 2019-12-03 NOTE — Telephone Encounter (Signed)
Darel Hong from [facility] had left me a voicemail.  I returned her call she states that [facility] won't let her initiate a referral for patient to see Dr. Valere Dross who is out of network with [facility]. I asked if I could send their notes since we haven't seen patient recently she agreed that we could send records along with note from Dr. Tanya Nones saying he recommends patient to be seen by Dr. Valere Dross.   I have called [facility] and got the fax number which is [phone] and put attn: Joann on the fax cover sheet. She also said that the letter had to be on our letterhead. I have faxed the last 4 notes from [facility] and the letter from Dr. Tanya Nones. I will reach back out to [place] once I have heard from [facility].  CB# [phone]
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
## 2019-12-10 ENCOUNTER — Ambulatory Visit (INDEPENDENT_AMBULATORY_CARE_PROVIDER_SITE_OTHER): Payer: No Typology Code available for payment source | Admitting: Ophthalmology

## 2019-12-10 ENCOUNTER — Other Ambulatory Visit: Payer: Self-pay

## 2019-12-10 ENCOUNTER — Encounter (INDEPENDENT_AMBULATORY_CARE_PROVIDER_SITE_OTHER): Payer: Self-pay | Admitting: Ophthalmology

## 2019-12-10 DIAGNOSIS — H25811 Combined forms of age-related cataract, right eye: Secondary | ICD-10-CM

## 2019-12-10 DIAGNOSIS — I10 Essential (primary) hypertension: Secondary | ICD-10-CM

## 2019-12-10 DIAGNOSIS — H35411 Lattice degeneration of retina, right eye: Secondary | ICD-10-CM

## 2019-12-10 DIAGNOSIS — H16232 Neurotrophic keratoconjunctivitis, left eye: Secondary | ICD-10-CM | POA: Diagnosis not present

## 2019-12-10 DIAGNOSIS — H3581 Retinal edema: Secondary | ICD-10-CM | POA: Diagnosis not present

## 2019-12-10 DIAGNOSIS — H3322 Serous retinal detachment, left eye: Secondary | ICD-10-CM

## 2019-12-10 DIAGNOSIS — H2702 Aphakia, left eye: Secondary | ICD-10-CM

## 2019-12-10 DIAGNOSIS — H35033 Hypertensive retinopathy, bilateral: Secondary | ICD-10-CM

## 2019-12-10 DIAGNOSIS — H527 Unspecified disorder of refraction: Secondary | ICD-10-CM

## 2019-12-10 DIAGNOSIS — H3522 Other non-diabetic proliferative retinopathy, left eye: Secondary | ICD-10-CM | POA: Diagnosis not present

## 2019-12-10 DIAGNOSIS — E119 Type 2 diabetes mellitus without complications: Secondary | ICD-10-CM

## 2019-12-10 DIAGNOSIS — H33321 Round hole, right eye: Secondary | ICD-10-CM

## 2019-12-17 ENCOUNTER — Encounter (INDEPENDENT_AMBULATORY_CARE_PROVIDER_SITE_OTHER): Payer: Self-pay | Admitting: Ophthalmology

## 2019-12-27 MED FILL — OFLOXACIN 0.3% EYE DROPS: 0.3 | 50 days supply | Qty: 10 | Fill #0

## 2019-12-27 MED FILL — PREDNISOLONE AC 1% EYE DROP: 1 | 25 days supply | Qty: 15 | Fill #1

## 2019-12-31 NOTE — Telephone Encounter (Signed)
I reached out to [place] today to see if they heard back from [facility]; however she is out of the office this afternoon but they did take down message for her to call me back.
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
## 2020-01-08 ENCOUNTER — Ambulatory Visit (INDEPENDENT_AMBULATORY_CARE_PROVIDER_SITE_OTHER): Payer: No Typology Code available for payment source | Admitting: Ophthalmology

## 2020-01-08 ENCOUNTER — Encounter (INDEPENDENT_AMBULATORY_CARE_PROVIDER_SITE_OTHER): Payer: Self-pay | Admitting: Ophthalmology

## 2020-01-08 ENCOUNTER — Other Ambulatory Visit: Payer: Self-pay

## 2020-01-08 ENCOUNTER — Other Ambulatory Visit (INDEPENDENT_AMBULATORY_CARE_PROVIDER_SITE_OTHER): Payer: Self-pay | Admitting: Ophthalmology

## 2020-01-08 DIAGNOSIS — H2702 Aphakia, left eye: Secondary | ICD-10-CM

## 2020-01-08 DIAGNOSIS — H3322 Serous retinal detachment, left eye: Secondary | ICD-10-CM | POA: Diagnosis not present

## 2020-01-08 DIAGNOSIS — E119 Type 2 diabetes mellitus without complications: Secondary | ICD-10-CM

## 2020-01-08 DIAGNOSIS — H16232 Neurotrophic keratoconjunctivitis, left eye: Secondary | ICD-10-CM

## 2020-01-08 DIAGNOSIS — H35411 Lattice degeneration of retina, right eye: Secondary | ICD-10-CM

## 2020-01-08 DIAGNOSIS — H3522 Other non-diabetic proliferative retinopathy, left eye: Secondary | ICD-10-CM

## 2020-01-08 DIAGNOSIS — H25811 Combined forms of age-related cataract, right eye: Secondary | ICD-10-CM

## 2020-01-08 DIAGNOSIS — H33321 Round hole, right eye: Secondary | ICD-10-CM

## 2020-01-08 DIAGNOSIS — H3581 Retinal edema: Secondary | ICD-10-CM

## 2020-01-08 DIAGNOSIS — I10 Essential (primary) hypertension: Secondary | ICD-10-CM

## 2020-01-08 DIAGNOSIS — H527 Unspecified disorder of refraction: Secondary | ICD-10-CM

## 2020-01-08 DIAGNOSIS — H35033 Hypertensive retinopathy, bilateral: Secondary | ICD-10-CM

## 2020-01-08 MED ORDER — SODIUM CHLORIDE (HYPERTONIC) 5 % OP OINT
1.0000 | TOPICAL_OINTMENT | Freq: Every day | OPHTHALMIC | 11 refills | Status: DC
Start: 2020-01-08 — End: 2020-01-08

## 2020-01-08 MED ORDER — MURO 128 2 % OP SOLN
1.0000 [drp] | Freq: Four times a day (QID) | OPHTHALMIC | 0 refills | Status: DC
Start: 2020-01-08 — End: 2020-03-02

## 2020-01-08 MED FILL — MURO-128 2% EYE DROPS: 2 | 75 days supply | Qty: 15 | Fill #0

## 2020-01-08 MED FILL — SODIUM CHLORIDE (HYPERTONIC: 5 | 30 days supply | Qty: 4 | Fill #0
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
## 2020-01-15 ENCOUNTER — Ambulatory Visit (INDEPENDENT_AMBULATORY_CARE_PROVIDER_SITE_OTHER): Payer: No Typology Code available for payment source | Admitting: Ophthalmology

## 2020-01-15 ENCOUNTER — Other Ambulatory Visit: Payer: Self-pay

## 2020-01-15 DIAGNOSIS — H3522 Other non-diabetic proliferative retinopathy, left eye: Secondary | ICD-10-CM

## 2020-01-15 DIAGNOSIS — H35411 Lattice degeneration of retina, right eye: Secondary | ICD-10-CM

## 2020-01-15 DIAGNOSIS — H35033 Hypertensive retinopathy, bilateral: Secondary | ICD-10-CM

## 2020-01-15 DIAGNOSIS — H33321 Round hole, right eye: Secondary | ICD-10-CM

## 2020-01-15 DIAGNOSIS — H25811 Combined forms of age-related cataract, right eye: Secondary | ICD-10-CM

## 2020-01-15 DIAGNOSIS — I10 Essential (primary) hypertension: Secondary | ICD-10-CM

## 2020-01-15 DIAGNOSIS — H16232 Neurotrophic keratoconjunctivitis, left eye: Secondary | ICD-10-CM | POA: Diagnosis not present

## 2020-01-15 DIAGNOSIS — H3322 Serous retinal detachment, left eye: Secondary | ICD-10-CM

## 2020-01-15 DIAGNOSIS — H3581 Retinal edema: Secondary | ICD-10-CM | POA: Diagnosis not present

## 2020-01-15 DIAGNOSIS — E119 Type 2 diabetes mellitus without complications: Secondary | ICD-10-CM

## 2020-01-15 DIAGNOSIS — H2702 Aphakia, left eye: Secondary | ICD-10-CM

## 2020-01-15 DIAGNOSIS — H527 Unspecified disorder of refraction: Secondary | ICD-10-CM

## 2020-01-16 ENCOUNTER — Encounter (INDEPENDENT_AMBULATORY_CARE_PROVIDER_SITE_OTHER): Payer: Self-pay | Admitting: Ophthalmology
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
## 2020-01-21 ENCOUNTER — Encounter (INDEPENDENT_AMBULATORY_CARE_PROVIDER_SITE_OTHER): Payer: Self-pay | Admitting: Ophthalmology

## 2020-01-21 ENCOUNTER — Ambulatory Visit (INDEPENDENT_AMBULATORY_CARE_PROVIDER_SITE_OTHER): Payer: No Typology Code available for payment source | Admitting: Ophthalmology

## 2020-01-21 ENCOUNTER — Other Ambulatory Visit: Payer: Self-pay

## 2020-01-21 DIAGNOSIS — H25811 Combined forms of age-related cataract, right eye: Secondary | ICD-10-CM

## 2020-01-21 DIAGNOSIS — H35033 Hypertensive retinopathy, bilateral: Secondary | ICD-10-CM

## 2020-01-21 DIAGNOSIS — H33321 Round hole, right eye: Secondary | ICD-10-CM

## 2020-01-21 DIAGNOSIS — E119 Type 2 diabetes mellitus without complications: Secondary | ICD-10-CM

## 2020-01-21 DIAGNOSIS — H3581 Retinal edema: Secondary | ICD-10-CM | POA: Diagnosis not present

## 2020-01-21 DIAGNOSIS — I10 Essential (primary) hypertension: Secondary | ICD-10-CM

## 2020-01-21 DIAGNOSIS — H527 Unspecified disorder of refraction: Secondary | ICD-10-CM

## 2020-01-21 DIAGNOSIS — H3322 Serous retinal detachment, left eye: Secondary | ICD-10-CM | POA: Diagnosis not present

## 2020-01-21 DIAGNOSIS — H16232 Neurotrophic keratoconjunctivitis, left eye: Secondary | ICD-10-CM | POA: Diagnosis not present

## 2020-01-21 DIAGNOSIS — H2702 Aphakia, left eye: Secondary | ICD-10-CM

## 2020-01-21 DIAGNOSIS — H3522 Other non-diabetic proliferative retinopathy, left eye: Secondary | ICD-10-CM | POA: Diagnosis not present

## 2020-01-21 DIAGNOSIS — H35411 Lattice degeneration of retina, right eye: Secondary | ICD-10-CM
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
## 2020-01-29 ENCOUNTER — Encounter (HOSPITAL_COMMUNITY): Payer: Self-pay | Admitting: Ophthalmology

## 2020-01-29 ENCOUNTER — Other Ambulatory Visit (HOSPITAL_COMMUNITY)
Admission: RE | Admit: 2020-01-29 | Discharge: 2020-01-29 | Disposition: A | Discharge status: Home / Self Care | Payer: No Typology Code available for payment source | Source: Ambulatory Visit | Attending: Ophthalmology | Admitting: Ophthalmology

## 2020-01-29 DIAGNOSIS — Z01812 Encounter for preprocedural laboratory examination: Secondary | ICD-10-CM | POA: Diagnosis not present

## 2020-01-29 DIAGNOSIS — Z20822 Contact with and (suspected) exposure to covid-19: Secondary | ICD-10-CM | POA: Insufficient documentation

## 2020-01-29 LAB — SARS CORONAVIRUS 2 (TAT 6-24 HRS): SARS Coronavirus 2: NEGATIVE

## 2020-01-29 NOTE — Progress Notes (Addendum)
Anesthesia Chart Review: Same day workup  Follows with cardiology for hx ofCAD s/p NSTEMI in 5/13 and inferior STEMI in 12/14. In 5/13, he had an NSTEMI with BMS to totally occluded distal RCA. In 12/14, he had an inferior STEMI with 95% mid RCA (distal RCA stent patent) at [facility]. He had BMS to [facility]. Echo (12/14) showed EF 50-55%.He was admitted with unstable angina in 12/17, had DES to mid LAD 90% stenosis. Cath at that time showed previous RCA stents to be patent. Peripheral arterial dopplers in 8/18 showed 75-99% mid left femoral artery stenosis. Echo in 12/18 with EF 55-60%.  Last seen by Dr. Shirlee Latch 07/31/18. Per note, "He is doing well, working full time as a Engineer, civil (consulting) in the [employer]. Main complaint is pain from right knee lateral meniscal tear, plan for surgery when outpatient procedures restart. No chest pain. He notes mild dyspnea carrying bags of soil out in his yard, but otherwise no significant exertional dyspnea. He has not noted claudication. Rare NSAID use. Glucose appears to be relatively controlled."  Last seen by PCP Dr. Lynnea Ferrier 06/18/19. Per note, pt doing well at that time, denied any CP or DOE.   Recently underwent similar ophthalmologic surgery 09/05/19 without complication.  No interim health changes noted since that time.  DMII, last A1c 7.6 on 06/18/19.   Will need DOS labs and eval.  EKG 09/27/18:Sinus tachycardia. Rate 103.Otherwise normal ECG  TTE 03/30/17: - Left ventricle: The cavity size was normal. Systolic function was  normal. The estimated ejection fraction was in the range of 55%  to 60%. Wall motion was normal; there were no regional wall  motion abnormalities. Left ventricular diastolic function  parameters were normal.  - Aortic valve: There was mild regurgitation.  - Left atrium: The atrium was moderately dilated.   Impressions:   - Previously described inferolateral hypokinesis is not seen on the   current study.  Cardiac Cath 03/30/2016 1. Severe LAD stenosis, treated successfully with PCI using a long drug-eluting stent platform 2. Continued patency of the stented segments in the right coronary artery 3. Severe stenosis of the distal left circumflex very small area of myocardium, appropriate for medical therapy 4. Normal LV systolic function   Zannie Cove [facility] Short Stay Center/Anesthesiology Phone [phone] 01/29/2020 9:54 AM

## 2020-01-29 NOTE — Progress Notes (Signed)
Spoke with Wife Keiffer Piper for PAT information/instructions.    PCP - Dr Lynnea Ferrier Cardiologist - Dr Marca Ancona  Chest x-ray - n/a EKG - DOS 01/30/20 Stress Test - 03/23/16 ECHO - 03/30/17 Cardiac Cath - 03/30/16  Fasting Blood Sugar - 115-120s Checks Blood Sugar 2 times a day  . Do not take oral diabetes medicines (jardiance, glipizide, metformin, januvia) the morning of surgery.  Do not take Jardiance today (10/27).  . If your blood sugar is less than 70 mg/dL, you will need to treat for low blood sugar: o Treat a low blood sugar (less than 70 mg/dL) with  cup of clear juice (cranberry or apple), 4 glucose tablets, OR glucose gel. o Recheck blood sugar in 15 minutes after treatment (to make sure it is greater than 70 mg/dL). If your blood sugar is not greater than 70 mg/dL on recheck, call [phone] for further instructions.  Blood Thinner Instructions:  Follow your surgeon's instructions on when to stop Xarelto prior to surgery.  Aspirin Instructions: Follow your surgeon's instructions on when to stop aspirin prior to surgery,  If no instructions were given by your surgeon then you will need to call the office for those instructions.  Anesthesia review: Yes  STOP now taking any Aspirin (unless otherwise instructed by your surgeon), Aleve, Naproxen, Ibuprofen, Motrin, Advil, Goody's, BC's, all herbal medications, fish oil, and all vitamins.   Coronavirus Screening Covid test results on 01/29/20 are pending. Do you have any of the following symptoms:  Cough yes/no: No Fever (>100.13F)  yes/no: No Runny nose yes/no: No Sore throat yes/no: No Difficulty breathing/shortness of breath  yes/no: No  Have you traveled in the last 14 days and where? yes/no: No  Wife Rhunette Croft verbalized understanding of instructions that were given via phone.

## 2020-01-29 NOTE — Anesthesia Preprocedure Evaluation (Addendum)
Anesthesia Evaluation  Patient identified by MRN, date of birth, ID band Patient awake    Reviewed: Patient's Chart, lab work & pertinent test results  Airway Mallampati: II  TM Distance: >3 FB Neck ROM: Full    Dental  (+) Teeth Intact   Pulmonary neg pulmonary ROS, former smoker,    Pulmonary exam normal        Cardiovascular hypertension, Pt. on medications and Pt. on home beta blockers + CAD, + Past MI and + Cardiac Stents   Rhythm:Regular Rate:Normal     Neuro/Psych negative neurological ROS  negative psych ROS   GI/Hepatic Neg liver ROS, GERD  Medicated,  Endo/Other  diabetes, Type 2, Oral Hypoglycemic Agents  Renal/GU negative Renal ROS  negative genitourinary   Musculoskeletal  (+) Arthritis ,   Abdominal (+)  Abdomen: soft. Bowel sounds: normal.  Peds  Hematology  (+) Blood dyscrasia, anemia ,   Anesthesia Other Findings   Reproductive/Obstetrics                            Anesthesia Physical Anesthesia Plan  ASA: III  Anesthesia Plan: General   Post-op Pain Management:    Induction: Intravenous  PONV Risk Score and Plan: 2 and Ondansetron, Midazolam and Treatment may vary due to age or medical condition  Airway Management Planned: Mask and Oral ETT  Additional Equipment: None  Intra-op Plan:   Post-operative Plan: Extubation in OR  Informed Consent: I have reviewed the patients History and Physical, chart, labs and discussed the procedure including the risks, benefits and alternatives for the proposed anesthesia with the patient or authorized representative who has indicated his/her understanding and acceptance.     Dental advisory given  Plan Discussed with: CRNA  Anesthesia Plan Comments: (PAT note by Antionette Poles, PA-C: Follows with cardiology for hx ofCAD s/p NSTEMI in 5/13 and inferior STEMI in 12/14. In 5/13, he had an NSTEMI with BMS to totally  occluded distal RCA. In 12/14, he had an inferior STEMI with 95% mid RCA (distal RCA stent patent) at [facility]. He had BMS to [facility]. Echo (12/14) showed EF 50-55%.He was admitted with unstable angina in 12/17, had DES to mid LAD 90% stenosis. Cath at that time showed previous RCA stents to be patent. Peripheral arterial dopplers in 8/18 showed 75-99% mid left femoral artery stenosis. Echo in 12/18 with EF 55-60%.  Last seen by Dr. Shirlee Latch 07/31/18. Per note, "He is doing well, working full time as a Engineer, civil (consulting) in the [employer]. Main complaint is pain from right knee lateral meniscal tear, plan for surgery when outpatient procedures restart. No chest pain. He notes mild dyspnea carrying bags of soil out in his yard, but otherwise no significant exertional dyspnea. He has not noted claudication. Rare NSAID use. Glucose appears to be relatively controlled."  Last seen by PCP Dr. Lynnea Ferrier 06/18/19. Per note, pt doing well at that time, denied any CP or DOE.   Recently underwent similar ophthalmologic surgery 09/05/19 without complication.  No interim health changes noted since that time.  DMII, last A1c 7.6 on 06/18/19.   Will need DOS labs and eval.  EKG 09/27/18:Sinus tachycardia. Rate 103.Otherwise normal ECG  TTE 03/30/17: - Left ventricle: The cavity size was normal. Systolic function was  normal. The estimated ejection fraction was in the range of 55%  to 60%. Wall motion was normal; there were no regional wall  motion abnormalities. Left ventricular  diastolic function  parameters were normal.  - Aortic valve: There was mild regurgitation.  - Left atrium: The atrium was moderately dilated.   Impressions:   - Previously described inferolateral hypokinesis is not seen on the  current study.  Cardiac Cath 03/30/2016 1. Severe LAD stenosis, treated successfully with PCI using a long drug-eluting stent platform 2. Continued patency of the  stented segments in the right coronary artery 3. Severe stenosis of the distal left circumflex very small area of myocardium, appropriate for medical therapy 4. Normal LV systolic function )       Anesthesia Quick Evaluation

## 2020-01-30 ENCOUNTER — Ambulatory Visit (HOSPITAL_COMMUNITY): Payer: No Typology Code available for payment source | Admitting: Physician Assistant

## 2020-01-30 ENCOUNTER — Encounter (HOSPITAL_COMMUNITY)
Admission: RE | Disposition: A | Discharge status: Home / Self Care | Payer: Self-pay | Source: Home / Self Care | Attending: Ophthalmology

## 2020-01-30 ENCOUNTER — Other Ambulatory Visit: Payer: Self-pay

## 2020-01-30 ENCOUNTER — Encounter (HOSPITAL_COMMUNITY): Payer: Self-pay | Admitting: Ophthalmology

## 2020-01-30 ENCOUNTER — Ambulatory Visit (HOSPITAL_COMMUNITY)
Admission: RE | Admit: 2020-01-30 | Discharge: 2020-01-30 | Disposition: A | Discharge status: Home / Self Care | Payer: No Typology Code available for payment source | Attending: Ophthalmology | Admitting: Ophthalmology

## 2020-01-30 DIAGNOSIS — Z87891 Personal history of nicotine dependence: Secondary | ICD-10-CM | POA: Insufficient documentation

## 2020-01-30 DIAGNOSIS — H3322 Serous retinal detachment, left eye: Secondary | ICD-10-CM | POA: Insufficient documentation

## 2020-01-30 HISTORY — DX: Unspecified osteoarthritis, unspecified site: M19.90

## 2020-01-30 HISTORY — PX: PARS PLANA VITRECTOMY: SHX2166

## 2020-01-30 HISTORY — PX: SILICON OIL REMOVAL: SHX5305

## 2020-01-30 LAB — CBC
HCT: 42.7 % (ref 39.0–52.0)
Hemoglobin: 13.2 g/dL (ref 13.0–17.0)
MCH: 24 pg — ABNORMAL LOW (ref 26.0–34.0)
MCHC: 30.9 g/dL (ref 30.0–36.0)
MCV: 77.8 fL — ABNORMAL LOW (ref 80.0–100.0)
Platelets: 276 10*3/uL (ref 150–400)
RBC: 5.49 MIL/uL (ref 4.22–5.81)
RDW: 14.6 % (ref 11.5–15.5)
WBC: 5 10*3/uL (ref 4.0–10.5)
nRBC: 0 % (ref 0.0–0.2)

## 2020-01-30 LAB — GLUCOSE, CAPILLARY
Glucose-Capillary: 155 mg/dL — ABNORMAL HIGH (ref 70–99)
Glucose-Capillary: 135 mg/dL — ABNORMAL HIGH (ref 70–99)

## 2020-01-30 LAB — BASIC METABOLIC PANEL
Anion gap: 10 (ref 5–15)
BUN: 10 mg/dL (ref 8–23)
CO2: 23 mmol/L (ref 22–32)
Calcium: 9 mg/dL (ref 8.9–10.3)
Chloride: 106 mmol/L (ref 98–111)
Creatinine, Ser: 0.88 mg/dL (ref 0.61–1.24)
GFR, Estimated: >60 mL/min (ref >60)
Glucose, Bld: 147 mg/dL — ABNORMAL HIGH (ref 70–99)
Potassium: 3.9 mmol/L (ref 3.5–5.1)
Sodium: 139 mmol/L (ref 135–145)

## 2020-01-30 SURGERY — REMOVAL, SILICONE OIL, EYE
Anesthesia: General | Site: Eye | Laterality: Left

## 2020-01-30 MED ORDER — METOPROLOL SUCCINATE ER 25 MG PO TB24: 75.0000 mg | ORAL_TABLET | Freq: Once | ORAL | Status: AC

## 2020-01-30 MED ORDER — NA CHONDROIT SULF-NA HYALURON 40-30 MG/ML IO SOSY
INTRAOCULAR | Status: AC
  Filled 2020-01-30: qty 1

## 2020-01-30 MED ORDER — CHLORHEXIDINE GLUCONATE 0.12 % MT SOLN
OROMUCOSAL | Status: AC
  Administered 2020-01-30: 15 mL via OROMUCOSAL
  Filled 2020-01-30: qty 15

## 2020-01-30 MED ORDER — BALANCED SALT IO SOLN
INTRAOCULAR | Status: DC | PRN
  Administered 2020-01-30: 15 mL via INTRAOCULAR

## 2020-01-30 MED ORDER — STERILE WATER FOR INJECTION IJ SOLN: INTRAMUSCULAR | Status: DC | PRN

## 2020-01-30 MED ORDER — GATIFLOXACIN 0.5 % OP SOLN OPTIME - NO CHARGE
OPHTHALMIC | Status: DC | PRN
  Administered 2020-01-30: 1 [drp] via OPHTHALMIC

## 2020-01-30 MED ORDER — POLYMYXIN B SULFATE 500000 UNITS IJ SOLR
INTRAMUSCULAR | Status: AC
  Filled 2020-01-30: qty 500000

## 2020-01-30 MED ORDER — MIDAZOLAM HCL 2 MG/2ML IJ SOLN
INTRAMUSCULAR | Status: AC
  Filled 2020-01-30: qty 2

## 2020-01-30 MED ORDER — METOPROLOL SUCCINATE ER 25 MG PO TB24
ORAL_TABLET | ORAL | Status: AC
  Filled 2020-01-30: qty 2

## 2020-01-30 MED ORDER — STERILE WATER FOR IRRIGATION IR SOLN
Status: DC | PRN
  Administered 2020-01-30: 500 mL

## 2020-01-30 MED ORDER — STERILE WATER FOR INJECTION IJ SOLN
INTRAMUSCULAR | Status: AC
  Filled 2020-01-30: qty 10

## 2020-01-30 MED ORDER — ROCURONIUM BROMIDE 10 MG/ML (PF) SYRINGE
PREFILLED_SYRINGE | INTRAVENOUS | Status: DC | PRN
  Administered 2020-01-30: 40 mg via INTRAVENOUS
  Administered 2020-01-30: 60 mg via INTRAVENOUS
  Administered 2020-01-30: 20 mg via INTRAVENOUS

## 2020-01-30 MED ORDER — TRIAMCINOLONE ACETONIDE 40 MG/ML IJ SUSP
INTRAMUSCULAR | Status: AC
  Filled 2020-01-30: qty 5

## 2020-01-30 MED ORDER — BACITRACIN-POLYMYXIN B 500-10000 UNIT/GM OP OINT
TOPICAL_OINTMENT | OPHTHALMIC | Status: DC | PRN
  Administered 2020-01-30: 1 via OPHTHALMIC

## 2020-01-30 MED ORDER — ONDANSETRON HCL 4 MG/2ML IJ SOLN
INTRAMUSCULAR | Status: DC | PRN
  Administered 2020-01-30: 4 mg via INTRAVENOUS

## 2020-01-30 MED ORDER — EPINEPHRINE PF 1 MG/ML IJ SOLN
INTRAMUSCULAR | Status: DC | PRN
  Administered 2020-01-30: .3 mL

## 2020-01-30 MED ORDER — SUGAMMADEX SODIUM 200 MG/2ML IV SOLN
INTRAVENOUS | Status: DC | PRN
  Administered 2020-01-30: 50 mg via INTRAVENOUS
  Administered 2020-01-30: 100 mg via INTRAVENOUS
  Administered 2020-01-30: 50 mg via INTRAVENOUS

## 2020-01-30 MED ORDER — BRIMONIDINE TARTRATE 0.2 % OP SOLN
OPHTHALMIC | Status: AC
  Filled 2020-01-30: qty 5

## 2020-01-30 MED ORDER — BRIMONIDINE TARTRATE 0.2 % OP SOLN
OPHTHALMIC | Status: DC | PRN
  Administered 2020-01-30: 1 [drp] via OPHTHALMIC

## 2020-01-30 MED ORDER — CHLORHEXIDINE GLUCONATE 0.12 % MT SOLN: 15.0000 mL | Freq: Once | OROMUCOSAL | Status: AC

## 2020-01-30 MED ORDER — NA CHONDROIT SULF-NA HYALURON 40-30 MG/ML IO SOSY
INTRAOCULAR | Status: DC | PRN
  Administered 2020-01-30: 0.5 mL via INTRAOCULAR

## 2020-01-30 MED ORDER — ROCURONIUM BROMIDE 10 MG/ML (PF) SYRINGE
PREFILLED_SYRINGE | INTRAVENOUS | Status: AC
  Filled 2020-01-30: qty 10

## 2020-01-30 MED ORDER — HYDROMORPHONE HCL 1 MG/ML IJ SOLN
INTRAMUSCULAR | Status: AC
  Filled 2020-01-30: qty 1

## 2020-01-30 MED ORDER — METOPROLOL SUCCINATE ER 25 MG PO TB24: 75.0000 mg | ORAL_TABLET | Freq: Every day | ORAL | Status: DC

## 2020-01-30 MED ORDER — PROPOFOL 10 MG/ML IV BOLUS
INTRAVENOUS | Status: AC
  Filled 2020-01-30: qty 20

## 2020-01-30 MED ORDER — ONDANSETRON HCL 4 MG/2ML IJ SOLN
INTRAMUSCULAR | Status: AC
  Filled 2020-01-30: qty 2

## 2020-01-30 MED ORDER — LIDOCAINE 2% (20 MG/ML) 5 ML SYRINGE
INTRAMUSCULAR | Status: DC | PRN
  Administered 2020-01-30: 20 mg via INTRAVENOUS
  Administered 2020-01-30: 80 mg via INTRAVENOUS

## 2020-01-30 MED ORDER — BSS PLUS IO SOLN
INTRAOCULAR | Status: AC
  Filled 2020-01-30: qty 500

## 2020-01-30 MED ORDER — FENTANYL CITRATE (PF) 250 MCG/5ML IJ SOLN
INTRAMUSCULAR | Status: DC | PRN
  Administered 2020-01-30: 50 ug via INTRAVENOUS
  Administered 2020-01-30: 25 ug via INTRAVENOUS
  Administered 2020-01-30: 100 ug via INTRAVENOUS

## 2020-01-30 MED ORDER — BACITRACIN-POLYMYXIN B 500-10000 UNIT/GM OP OINT
TOPICAL_OINTMENT | OPHTHALMIC | Status: AC
  Filled 2020-01-30: qty 3.5

## 2020-01-30 MED ORDER — HYDROMORPHONE HCL 1 MG/ML IJ SOLN
0.2500 mg | INTRAMUSCULAR | Status: DC | PRN
  Administered 2020-01-30: 0.5 mg via INTRAVENOUS
  Administered 2020-01-30 (×2): 0.25 mg via INTRAVENOUS
  Administered 2020-01-30: 0.5 mg via INTRAVENOUS

## 2020-01-30 MED ORDER — ACETAMINOPHEN 10 MG/ML IV SOLN
1000.0000 mg | Freq: Once | INTRAVENOUS | Status: DC | PRN
  Administered 2020-01-30: 1000 mg via INTRAVENOUS

## 2020-01-30 MED ORDER — ONDANSETRON HCL 4 MG/2ML IJ SOLN: 4.0000 mg | Freq: Once | INTRAMUSCULAR | Status: DC | PRN

## 2020-01-30 MED ORDER — ORAL CARE MOUTH RINSE: 15.0000 mL | Freq: Once | OROMUCOSAL | Status: AC

## 2020-01-30 MED ORDER — TRIAMCINOLONE ACETONIDE 40 MG/ML IJ SUSP
INTRAMUSCULAR | Status: DC | PRN
  Administered 2020-01-30: 40 mg

## 2020-01-30 MED ORDER — ATROPINE SULFATE 1 % OP SOLN
OPHTHALMIC | Status: DC | PRN
  Administered 2020-01-30: 1 [drp] via OPHTHALMIC

## 2020-01-30 MED ORDER — SODIUM CHLORIDE (PF) 0.9 % IJ SOLN
INTRAMUSCULAR | Status: AC
  Filled 2020-01-30: qty 10

## 2020-01-30 MED ORDER — ACETAMINOPHEN 10 MG/ML IV SOLN
INTRAVENOUS | Status: AC
  Filled 2020-01-30: qty 100

## 2020-01-30 MED ORDER — DORZOLAMIDE HCL-TIMOLOL MAL 2-0.5 % OP SOLN
OPHTHALMIC | Status: AC
  Filled 2020-01-30: qty 10

## 2020-01-30 MED ORDER — GATIFLOXACIN 0.5 % OP SOLN
OPHTHALMIC | Status: AC
  Filled 2020-01-30: qty 2.5

## 2020-01-30 MED ORDER — BSS PLUS IO SOLN
INTRAOCULAR | Status: DC | PRN
  Administered 2020-01-30: 1 via INTRAOCULAR

## 2020-01-30 MED ORDER — MIDAZOLAM HCL 2 MG/2ML IJ SOLN
INTRAMUSCULAR | Status: DC | PRN
  Administered 2020-01-30: 2 mg via INTRAVENOUS

## 2020-01-30 MED ORDER — PREDNISOLONE ACETATE 1 % OP SUSP
OPHTHALMIC | Status: DC | PRN
  Administered 2020-01-30: 1 [drp] via OPHTHALMIC

## 2020-01-30 MED ORDER — METOPROLOL SUCCINATE ER 25 MG PO TB24
ORAL_TABLET | ORAL | Status: AC
  Administered 2020-01-30: 75 mg via ORAL
  Filled 2020-01-30: qty 1

## 2020-01-30 MED ORDER — DORZOLAMIDE HCL-TIMOLOL MAL 2-0.5 % OP SOLN
OPHTHALMIC | Status: DC | PRN
  Administered 2020-01-30: 1 [drp] via OPHTHALMIC

## 2020-01-30 MED ORDER — TROPICAMIDE 1 % OP SOLN
1.0000 [drp] | OPHTHALMIC | Status: AC | PRN
  Administered 2020-01-30 (×3): 1 [drp] via OPHTHALMIC
  Filled 2020-01-30: qty 15

## 2020-01-30 MED ORDER — STERILE WATER FOR INJECTION IJ SOLN
INTRAMUSCULAR | Status: DC | PRN
  Administered 2020-01-30: 20 mL

## 2020-01-30 MED ORDER — BSS IO SOLN
INTRAOCULAR | Status: AC
  Filled 2020-01-30: qty 15

## 2020-01-30 MED ORDER — PHENYLEPHRINE HCL 10 % OP SOLN
1.0000 [drp] | OPHTHALMIC | Status: AC | PRN
  Administered 2020-01-30 (×3): 1 [drp] via OPHTHALMIC
  Filled 2020-01-30: qty 5

## 2020-01-30 MED ORDER — LIDOCAINE 2% (20 MG/ML) 5 ML SYRINGE
INTRAMUSCULAR | Status: AC
  Filled 2020-01-30: qty 5

## 2020-01-30 MED ORDER — PROPOFOL 10 MG/ML IV BOLUS
INTRAVENOUS | Status: DC | PRN
  Administered 2020-01-30: 200 mg via INTRAVENOUS

## 2020-01-30 MED ORDER — PROPARACAINE HCL 0.5 % OP SOLN
1.0000 [drp] | OPHTHALMIC | Status: AC | PRN
  Administered 2020-01-30 (×3): 1 [drp] via OPHTHALMIC
  Filled 2020-01-30: qty 15

## 2020-01-30 MED ORDER — EPINEPHRINE PF 1 MG/ML IJ SOLN
INTRAMUSCULAR | Status: AC
  Filled 2020-01-30: qty 1

## 2020-01-30 MED ORDER — METOPROLOL SUCCINATE ER 25 MG PO TB24: 25.0000 mg | ORAL_TABLET | Freq: Every day | ORAL | Status: DC

## 2020-01-30 MED ORDER — PREDNISOLONE ACETATE 1 % OP SUSP
OPHTHALMIC | Status: AC
  Filled 2020-01-30: qty 5

## 2020-01-30 MED ORDER — FENTANYL CITRATE (PF) 250 MCG/5ML IJ SOLN
INTRAMUSCULAR | Status: AC
  Filled 2020-01-30: qty 5

## 2020-01-30 MED ORDER — PHENYLEPHRINE 40 MCG/ML (10ML) SYRINGE FOR IV PUSH (FOR BLOOD PRESSURE SUPPORT)
PREFILLED_SYRINGE | INTRAVENOUS | Status: AC
  Filled 2020-01-30: qty 10

## 2020-01-30 MED ORDER — PHENYLEPHRINE 40 MCG/ML (10ML) SYRINGE FOR IV PUSH (FOR BLOOD PRESSURE SUPPORT)
PREFILLED_SYRINGE | INTRAVENOUS | Status: DC | PRN
  Administered 2020-01-30: 80 ug via INTRAVENOUS
  Administered 2020-01-30: 40 ug via INTRAVENOUS

## 2020-01-30 MED ORDER — ATROPINE SULFATE 1 % OP SOLN
1.0000 [drp] | OPHTHALMIC | Status: AC | PRN
  Administered 2020-01-30 (×3): 1 [drp] via OPHTHALMIC
  Filled 2020-01-30: qty 5

## 2020-01-30 MED ORDER — CEFTAZIDIME 1 G IJ SOLR
INTRAMUSCULAR | Status: AC
  Filled 2020-01-30: qty 1

## 2020-01-30 MED ORDER — SODIUM CHLORIDE 0.9 % IV SOLN: INTRAVENOUS | Status: DC

## 2020-01-30 SURGICAL SUPPLY — 67 items
APPLICATOR COTTON TIP 6 STRL (MISCELLANEOUS) ×20 IMPLANT
APPLICATOR COTTON TIP 6IN STRL (MISCELLANEOUS) ×30
BAND WRIST GAS GREEN (MISCELLANEOUS) IMPLANT
BETADINE 5% OPHTHALMIC (OPHTHALMIC) ×2 IMPLANT
BLADE EYE CATARACT 19 1.4 BEAV (BLADE) IMPLANT
BLADE KERATOME 2.75 (BLADE) ×3 IMPLANT
BNDG EYE OVAL (GAUZE/BANDAGES/DRESSINGS) ×6 IMPLANT
CABLE BIPOLOR RESECTION CORD (MISCELLANEOUS) ×3 IMPLANT
CANNULA ANT CHAM MAIN (OPHTHALMIC RELATED) IMPLANT
CANNULA ANT/CHMB 27GA (MISCELLANEOUS) ×3 IMPLANT
CANNULA FLEX TIP 25G (CANNULA) ×3 IMPLANT
CANNULA TROCAR 23 GA VLV (OPHTHALMIC) IMPLANT
CANNULA VLV SOFT TIP 25GA (OPHTHALMIC) IMPLANT
CLSR STERI-STRIP ANTIMIC 1/2X4 (GAUZE/BANDAGES/DRESSINGS) ×3 IMPLANT
COVER WAND RF STERILE (DRAPES) IMPLANT
DRAPE MICROSCOPE LEICA 46X105 (MISCELLANEOUS) ×3 IMPLANT
DRAPE OPHTHALMIC 77X100 STRL (CUSTOM PROCEDURE TRAY) ×3 IMPLANT
ERASER HMR WETFIELD 23G BP (MISCELLANEOUS) IMPLANT
FILTER BLUE MILLIPORE (MISCELLANEOUS) IMPLANT
FILTER STRAW FLUID ASPIR (MISCELLANEOUS) IMPLANT
FORCEPS GRIESHABER ILM 25G A (INSTRUMENTS) IMPLANT
GAS AUTO FILL CONSTEL (OPHTHALMIC)
GAS AUTO FILL CONSTELLATION (OPHTHALMIC) IMPLANT
GAS WRIST BAND GREEN (MISCELLANEOUS)
GLOVE BIO SURGEON STRL SZ7.5 (GLOVE) ×6 IMPLANT
GLOVE BIOGEL M 7.0 STRL (GLOVE) ×3 IMPLANT
GOWN STRL REUS W/ TWL LRG LVL3 (GOWN DISPOSABLE) ×4 IMPLANT
GOWN STRL REUS W/ TWL XL LVL3 (GOWN DISPOSABLE) ×2 IMPLANT
GOWN STRL REUS W/TWL LRG LVL3 (GOWN DISPOSABLE) ×2
GOWN STRL REUS W/TWL XL LVL3 (GOWN DISPOSABLE) ×1
KIT BASIN OR (CUSTOM PROCEDURE TRAY) ×3 IMPLANT
KIT PERFLUORON PROCEDURE 5ML (MISCELLANEOUS) IMPLANT
LENS BIOM SUPER VIEW SET DISP (MISCELLANEOUS) IMPLANT
LENS CONTACT P50 8.4 DAY/NIGHT (INTRAOCULAR LENS) IMPLANT
LENS VITRECTOMY FLAT OCLR DISP (MISCELLANEOUS) IMPLANT
LOOP FINESSE 25 GA (MISCELLANEOUS) ×3 IMPLANT
MICROPICK 25G (MISCELLANEOUS)
NEEDLE 18GX1X1/2 (RX/OR ONLY) (NEEDLE) ×3 IMPLANT
NEEDLE 25GX 5/8IN NON SAFETY (NEEDLE) ×6 IMPLANT
NEEDLE HYPO 30X.5 LL (NEEDLE) ×6 IMPLANT
NEEDLE PRECISIONGLIDE 27X1.5 (NEEDLE) IMPLANT
NS IRRIG 1000ML POUR BTL (IV SOLUTION) ×3 IMPLANT
OPHTHALMIC BETADINE 5% (OPHTHALMIC) ×1
PACK CATARACT/VITRECTOMY 25GA (OPHTHALMIC) ×3 IMPLANT
PACK VITRECTOMY CUSTOM (CUSTOM PROCEDURE TRAY) ×3 IMPLANT
PAD ARMBOARD 7.5X6 YLW CONV (MISCELLANEOUS) ×6 IMPLANT
PAK PIK CATARACT/RETINA 23GA (OPHTHALMIC) IMPLANT
PENCIL BIPOLAR 25GA STR DISP (OPHTHALMIC RELATED) ×3 IMPLANT
PICK MICROPICK 25G (MISCELLANEOUS) IMPLANT
PROBE DIATHERMY DSP 27GA (MISCELLANEOUS) IMPLANT
PROBE ENDO DIATHERMY 25G (MISCELLANEOUS) ×3 IMPLANT
PROBE LASER ILLUM FLEX CVD 25G (OPHTHALMIC) ×3 IMPLANT
REPL STRA BRUSH NEEDLE (NEEDLE) IMPLANT
RESERVOIR BACK FLUSH (MISCELLANEOUS) IMPLANT
RETRACTOR IRIS FLEX 25G GRIESH (INSTRUMENTS) IMPLANT
SET INJECTOR OIL FLUID CONSTEL (OPHTHALMIC) ×3 IMPLANT
SPONGE SURGIFOAM ABS GEL 12-7 (HEMOSTASIS) IMPLANT
STOPCOCK 4 WAY LG BORE MALE ST (IV SETS) IMPLANT
SUT ETHILON 10 0 CS140 6 (SUTURE) IMPLANT
SUT VICRYL 7 0 TG140 8 (SUTURE) ×3 IMPLANT
SYR 10ML LL (SYRINGE) ×3 IMPLANT
SYR 20ML LL LF (SYRINGE) ×3 IMPLANT
SYR 5ML LL (SYRINGE) ×3 IMPLANT
SYR TB 1ML LUER SLIP (SYRINGE) ×6 IMPLANT
TOWEL GREEN STERILE FF (TOWEL DISPOSABLE) ×3 IMPLANT
TUBING HIGH PRESS EXTEN 6IN (TUBING) ×3 IMPLANT
WATER STERILE IRR 1000ML POUR (IV SOLUTION) ×3 IMPLANT

## 2020-01-30 NOTE — Interval H&P Note (Signed)
History and Physical Interval Note:  01/30/2020 8:55 AM  Derrick Todd  has presented today for surgery, with the diagnosis of retinal detachment with silicone oil injection, left eye.  The various methods of treatment have been discussed with the patient and family. After consideration of risks, benefits and other options for treatment, the patient has consented to  Procedure(s): SILICON OIL REMOVAL (Left) PLACEMENT AND SUTURE OF SECONDARY INTRAOCULAR LENS (Left) as a surgical intervention.  The patient's history has been reviewed, patient examined, no change in status, stable for surgery.  I have reviewed the patient's chart and labs.  Questions were answered to the patient's satisfaction.     Rennis Chris

## 2020-01-30 NOTE — Discharge Instructions (Addendum)
POSTOPERATIVE INSTRUCTIONS  Your doctor has performed vitreoretinal surgery on you at [facility]. [facility].  - Keep eye patched and shielded until seen by Dr. Vanessa Barbara 8 AM tomorrow in clinic - Do not use drops until return - Keep head elevated while awake - Sleep with head elevated 30-45 degrees.    - No strenuous bending, stooping or lifting.  - You may not drive until further notice.  General Anesthesia, Adult, Care After This sheet gives you information about how to care for yourself after your procedure. Your health care provider may also give you more specific instructions. If you have problems or questions, contact your health care provider. What can I expect after the procedure? After the procedure, the following side effects are common:  Pain or discomfort at the IV site.  Nausea.  Vomiting.  Sore throat.  Trouble concentrating.  Feeling cold or chills.  Weak or tired.  Sleepiness and fatigue.  Soreness and body aches. These side effects can affect parts of the body that were not involved in surgery. Follow these instructions at home:  For at least 24 hours after the procedure:  Have a responsible adult stay with you. It is important to have someone help care for you until you are awake and alert.  Rest as needed.  Do not: ? Participate in activities in which you could fall or become injured. ? Drive. ? Use heavy machinery. ? Drink alcohol. ? Take sleeping pills or medicines that cause drowsiness. ? Make important decisions or sign legal documents. ? Take care of children on your own. Eating and drinking  Follow any instructions from your health care provider about eating or drinking restrictions.  When you feel hungry, start by eating small amounts of foods that are soft and easy to digest (bland), such as toast. Gradually return to your regular diet.  Drink enough fluid to keep your urine pale yellow.  If you vomit, rehydrate by drinking  water, juice, or clear broth. General instructions  If you have sleep apnea, surgery and certain medicines can increase your risk for breathing problems. Follow instructions from your health care provider about wearing your sleep device: ? Anytime you are sleeping, including during daytime naps. ? While taking prescription pain medicines, sleeping medicines, or medicines that make you drowsy.  Return to your normal activities as told by your health care provider. Ask your health care provider what activities are safe for you.  Take over-the-counter and prescription medicines only as told by your health care provider.  If you smoke, do not smoke without supervision.  Keep all follow-up visits as told by your health care provider. This is important. Contact a health care provider if:  You have nausea or vomiting that does not get better with medicine.  You cannot eat or drink without vomiting.  You have pain that does not get better with medicine.  You are unable to pass urine.  You develop a skin rash.  You have a fever.  You have redness around your IV site that gets worse. Get help right away if:  You have difficulty breathing.  You have chest pain.  You have blood in your urine or stool, or you vomit blood. Summary  After the procedure, it is common to have a sore throat or nausea. It is also common to feel tired.  Have a responsible adult stay with you for the first 24 hours after general anesthesia. It is important to have someone help care for  you until you are awake and alert.  When you feel hungry, start by eating small amounts of foods that are soft and easy to digest (bland), such as toast. Gradually return to your regular diet.  Drink enough fluid to keep your urine pale yellow.  Return to your normal activities as told by your health care provider. Ask your health care provider what activities are safe for you. This information is not intended to replace  advice given to you by your health care provider. Make sure you discuss any questions you have with your health care provider. Document Revised: 03/24/2017 Document Reviewed: 11/04/2016 Elsevier Patient Education  2020 [employer].   - Tylenol or any other over-the-counter pain reliever can be used according to your doctor. If more pain medicine is required, your doctor will have a prescription for you.  - You may read, go up and down stairs, and watch television.     Rennis Chris, M.D., Ph.D.

## 2020-01-30 NOTE — Brief Op Note (Signed)
01/30/2020  12:36 PM  PATIENT:  Derrick Todd  59 y.o. male  PRE-OPERATIVE DIAGNOSIS:  retinal detachment with silicone oil injection, left eye  POST-OPERATIVE DIAGNOSIS:  retinal detachment with silicone oil injection, left eye  PROCEDURE:  Procedure(s): SILICON OIL REMOVAL (Left) PARS PLANA VITRECTOMY WITH 25 GAUGE (Left)  SURGEON:  Surgeon(s) and Role:    Rennis Chris, MD - Primary  ASSISTANTS: Laurian Brim, Ophthalmic Assistant  ANESTHESIA:   general  EBL:  minimal   BLOOD ADMINISTERED:none  DRAINS: none   LOCAL MEDICATIONS USED:  NONE  SPECIMEN:  No Specimen  DISPOSITION OF SPECIMEN:  [name]  COUNTS:  YES  TOURNIQUET:  * No tourniquets in log *  DICTATION: .Note written in EPIC  PLAN OF CARE: Discharge to home after PACU  PATIENT DISPOSITION:  PACU - hemodynamically stable.   Delay start of Pharmacological VTE agent (>24hrs) due to surgical blood loss or risk of bleeding: not applicable

## 2020-01-30 NOTE — Transfer of Care (Signed)
Immediate Anesthesia Transfer of Care Note  Patient: Derrick Todd  Procedure(s) Performed: SILICON OIL REMOVAL (Left Eye) PARS PLANA VITRECTOMY WITH 25 GAUGE (Left Eye)  Patient Location: PACU  Anesthesia Type:General  Level of Consciousness: awake, alert , oriented and patient cooperative  Airway & Oxygen Therapy: Patient Spontanous Breathing and Patient connected to face mask oxygen  Post-op Assessment: Report given to RN, Post -op Vital signs reviewed and stable and Patient moving all extremities  Post vital signs: Reviewed and stable  Last Vitals:  Vitals Value Taken Time  BP 142/86 01/30/20 1240  Temp    Pulse 74 01/30/20 1244  Resp 22 01/30/20 1244  SpO2 93 % 01/30/20 1244  Vitals shown include unvalidated device data.  Last Pain:  Vitals:   01/30/20 0910  TempSrc:   PainSc: 0-No pain         Complications: No complications documented.

## 2020-01-30 NOTE — Anesthesia Procedure Notes (Signed)
Procedure Name: Intubation Date/Time: 01/30/2020 10:44 AM Performed by: Rande Brunt, CRNA Pre-anesthesia Checklist: Patient identified, Emergency Drugs available, Suction available and Patient being monitored Patient Re-evaluated:Patient Re-evaluated prior to induction Oxygen Delivery Method: Circle System Utilized Preoxygenation: Pre-oxygenation with 100% oxygen Induction Type: IV induction Ventilation: Mask ventilation without difficulty Laryngoscope Size: Mac and 4 Grade View: Grade I Tube type: Oral Number of attempts: 1 Airway Equipment and Method: Stylet Placement Confirmation: ETT inserted through vocal cords under direct vision,  positive ETCO2 and breath sounds checked- equal and bilateral Secured at: 22 cm Tube secured with: Tape Dental Injury: Teeth and Oropharynx as per pre-operative assessment

## 2020-01-30 NOTE — Op Note (Signed)
Date of Surgery: 10.28.2021   Pre-Op Diagnosis:  1. History of retinal detachment with silicon oil injection, left eye   Post-op Diagnosis:  1. History of retinal detachment with silicon oil injection, left eye 2. Epiretinal membrane, left eye   Procedure:  1. 25 gauge [place] Vitrectomy, left eye 2. Silicone Oil Removal 3. Epiretinal membrane peel 4. Injection of 18% SF6 Gas   Surgeon: Rennis Chris, MD,PhD   Assistant: Laurian Brim, Ophthalmic Assistant   Anesthesia: General   Estimated Blood Loss: minimal   Complications: None   Description of the Procedure:    Patient was seen in the pre-operative area. After all remaining questions were answered, the operative LEFT EYE was marked and the informed consent was confirmed.  The patient was brought back to the operating room by the nursing staff. An additional time-out was performed. All in attendance (the nursing staff, anesthesia staff, and ophthalmology staff) agreed upon the patient, type of surgery, and the location of surgery. The operative eye was prepped and draped in sterile ophthalmic fashion.    Valved trocars were placed at the 0400, 0200, and 1000 oclock meridians. The infusion line was brought to the operative field and found to be functional and free of air bubbles. The infusion line was inserted in the inferotemporal trocar, visualized with light pipe, and turned on. The infusion was secured with steristrips.   Under widefield BIOM visualization, silicone oil was removed with the extraction syringe. Multiple air-fluid exchanges were performed with the vitrectomy cutter. Next using the wide-field viewing system, kenalog was injected into the vitreous and a limited posterior vitrectomy was performed. There was negligible remaining vitreous following his previous vitrectomy. On meticulous inspection, there were peripheral epiretinal membranes found posterior to the buckle, around the inferior retinotomies. We removed as  much of the epiretinal membranes as we could with 25g ILM forceps and a 25g finesse loop. Of note, there was fibrosis intrinsic to the retina that was unable to be removed, but much of the surrounding membranes were successfully removed. Following these maneuvers, The retina was in good position and attached over the buckle with good laser in place and the vitreous was free of silicon oil.   Next, the superotemporal trocar was removed and sutured with 7-0 vicryl in an interrupted fashion. The superonasal trocar and infusion cannula and associated trocar were then removed and sutured with 7-0 vicryl in an interrupted fashion. The sclerotomies were found to be water tight without leakage. The eye's intraocular pressure was brought to physiologic level by injection of sterile BSS using a 30g needle 3.90mm posterior to the limbus in the superonasal quadrant. Subconjunctival injections of antiobiotic and kenalog were administered. The lid speculum and drapes were removed. Drops of an antibiotic, antihypertensives, and steroid were given. Copious antibiotic ointment was instilled into the eye. The eye was patched and shielded. The patient tolerated the procedure well without any intraoperative or immediate postoperative complications. The patient was taken to the recovery room in good condition. The patient was instructed to maintain a head elevated position and will be seen by Dr. Vanessa Barbara tomorrow morning in clinic.

## 2020-01-30 NOTE — Anesthesia Postprocedure Evaluation (Signed)
Anesthesia Post Note  Patient: [name]  Procedure(s) Performed: SILICON OIL REMOVAL (Left Eye) PARS PLANA VITRECTOMY WITH 25 GAUGE (Left Eye)     Patient location during evaluation: PACU Anesthesia Type: General Level of consciousness: awake Pain management: pain level controlled Vital Signs Assessment: post-procedure vital signs reviewed and stable Respiratory status: spontaneous breathing, nonlabored ventilation, respiratory function stable and patient connected to nasal cannula oxygen Cardiovascular status: blood pressure returned to baseline and stable Postop Assessment: no apparent nausea or vomiting Anesthetic complications: no   No complications documented.  Last Vitals:  Vitals:   01/30/20 1345 01/30/20 1350  BP: 121/89 121/89  Pulse: 67 74  Resp: 11 15  Temp:  36.5 C  SpO2: 92% (!) 89%    Last Pain:  Vitals:   01/30/20 1335  TempSrc:   PainSc: 4                  Branko Steeves P Alberto Pina

## 2020-01-30 NOTE — Progress Notes (Signed)
Pacu Discharge Note  Patient instuctions were given to Corensa (brother). Wound care, diet, pain, follow up care and how and whom to contact with concerns were discussed. Family aware someone needs to remain with patient overnight and concerns after receiving anesthesia and what to avoid and safety. Answered all questions and concerns. Pt positioning of head elevated 30-45 degrees discussed.   Discharge paperwork has clear contact informations for surgeon and 24 hour RN line for concerns.   Discussed what concerns to look for including infection and signs/symptoms to look for.   IV was removed prior to discharge. Patient was brought to car with belongings.   Pt exits my care.

## 2020-01-31 ENCOUNTER — Encounter (HOSPITAL_COMMUNITY): Payer: Self-pay | Admitting: Ophthalmology

## 2020-01-31 ENCOUNTER — Ambulatory Visit (INDEPENDENT_AMBULATORY_CARE_PROVIDER_SITE_OTHER): Payer: No Typology Code available for payment source | Admitting: Ophthalmology

## 2020-01-31 DIAGNOSIS — H35033 Hypertensive retinopathy, bilateral: Secondary | ICD-10-CM

## 2020-01-31 DIAGNOSIS — H527 Unspecified disorder of refraction: Secondary | ICD-10-CM

## 2020-01-31 DIAGNOSIS — H3581 Retinal edema: Secondary | ICD-10-CM

## 2020-01-31 DIAGNOSIS — H3522 Other non-diabetic proliferative retinopathy, left eye: Secondary | ICD-10-CM

## 2020-01-31 DIAGNOSIS — H16232 Neurotrophic keratoconjunctivitis, left eye: Secondary | ICD-10-CM

## 2020-01-31 DIAGNOSIS — H3322 Serous retinal detachment, left eye: Secondary | ICD-10-CM

## 2020-01-31 DIAGNOSIS — I10 Essential (primary) hypertension: Secondary | ICD-10-CM

## 2020-01-31 DIAGNOSIS — H2702 Aphakia, left eye: Secondary | ICD-10-CM

## 2020-01-31 DIAGNOSIS — H25811 Combined forms of age-related cataract, right eye: Secondary | ICD-10-CM

## 2020-01-31 DIAGNOSIS — E119 Type 2 diabetes mellitus without complications: Secondary | ICD-10-CM

## 2020-01-31 DIAGNOSIS — H35411 Lattice degeneration of retina, right eye: Secondary | ICD-10-CM

## 2020-01-31 DIAGNOSIS — H33321 Round hole, right eye: Secondary | ICD-10-CM
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
## 2020-02-03 ENCOUNTER — Other Ambulatory Visit: Payer: Self-pay | Admitting: Family Medicine

## 2020-02-03 ENCOUNTER — Other Ambulatory Visit (HOSPITAL_COMMUNITY): Payer: Self-pay | Admitting: Family Medicine

## 2020-02-03 ENCOUNTER — Other Ambulatory Visit (HOSPITAL_COMMUNITY): Payer: Self-pay | Admitting: Cardiology

## 2020-02-03 MED FILL — JANUVIA 100 MG TABLET: 100 | 30 days supply | Qty: 30 | Fill #0

## 2020-02-03 MED FILL — JARDIANCE 25 MG TABLET: 25 | 90 days supply | Qty: 90 | Fill #0

## 2020-02-05 ENCOUNTER — Ambulatory Visit (INDEPENDENT_AMBULATORY_CARE_PROVIDER_SITE_OTHER): Payer: No Typology Code available for payment source | Admitting: Ophthalmology

## 2020-02-05 ENCOUNTER — Other Ambulatory Visit: Payer: Self-pay

## 2020-02-05 ENCOUNTER — Encounter (INDEPENDENT_AMBULATORY_CARE_PROVIDER_SITE_OTHER): Payer: Self-pay | Admitting: Ophthalmology

## 2020-02-05 DIAGNOSIS — H33321 Round hole, right eye: Secondary | ICD-10-CM

## 2020-02-05 DIAGNOSIS — H16232 Neurotrophic keratoconjunctivitis, left eye: Secondary | ICD-10-CM

## 2020-02-05 DIAGNOSIS — H25811 Combined forms of age-related cataract, right eye: Secondary | ICD-10-CM

## 2020-02-05 DIAGNOSIS — H35411 Lattice degeneration of retina, right eye: Secondary | ICD-10-CM

## 2020-02-05 DIAGNOSIS — H527 Unspecified disorder of refraction: Secondary | ICD-10-CM

## 2020-02-05 DIAGNOSIS — H3581 Retinal edema: Secondary | ICD-10-CM

## 2020-02-05 DIAGNOSIS — H35033 Hypertensive retinopathy, bilateral: Secondary | ICD-10-CM

## 2020-02-05 DIAGNOSIS — H2702 Aphakia, left eye: Secondary | ICD-10-CM

## 2020-02-05 DIAGNOSIS — H3522 Other non-diabetic proliferative retinopathy, left eye: Secondary | ICD-10-CM

## 2020-02-05 DIAGNOSIS — I10 Essential (primary) hypertension: Secondary | ICD-10-CM

## 2020-02-05 DIAGNOSIS — H3322 Serous retinal detachment, left eye: Secondary | ICD-10-CM

## 2020-02-05 DIAGNOSIS — E119 Type 2 diabetes mellitus without complications: Secondary | ICD-10-CM

## 2020-02-06 ENCOUNTER — Encounter (INDEPENDENT_AMBULATORY_CARE_PROVIDER_SITE_OTHER): Payer: Self-pay | Admitting: Ophthalmology

## 2020-02-07 ENCOUNTER — Other Ambulatory Visit (HOSPITAL_COMMUNITY): Payer: Self-pay | Admitting: Cardiology

## 2020-02-07 ENCOUNTER — Ambulatory Visit (INDEPENDENT_AMBULATORY_CARE_PROVIDER_SITE_OTHER): Payer: No Typology Code available for payment source | Admitting: Family Medicine

## 2020-02-07 ENCOUNTER — Other Ambulatory Visit: Payer: Self-pay

## 2020-02-07 ENCOUNTER — Other Ambulatory Visit: Payer: Self-pay | Admitting: Family Medicine

## 2020-02-07 VITALS — BP 160/90 | HR 85 | Temp 98.0°F | Ht 74.0 in | Wt 219.0 lb

## 2020-02-07 DIAGNOSIS — E119 Type 2 diabetes mellitus without complications: Secondary | ICD-10-CM | POA: Diagnosis not present

## 2020-02-07 DIAGNOSIS — I10 Essential (primary) hypertension: Secondary | ICD-10-CM | POA: Diagnosis not present

## 2020-02-07 DIAGNOSIS — I2510 Atherosclerotic heart disease of native coronary artery without angina pectoris: Secondary | ICD-10-CM

## 2020-02-07 DIAGNOSIS — E7800 Pure hypercholesterolemia, unspecified: Secondary | ICD-10-CM

## 2020-02-07 MED FILL — METFORMIN HCL 1000 MG TABS: 1000 | 90 days supply | Qty: 180 | Fill #1

## 2020-02-07 MED FILL — PANTOPRAZOLE SOD DR 40 MG T: 40 | 90 days supply | Qty: 90 | Fill #0

## 2020-02-07 MED FILL — LISINOPRIL 5 MG TABS: 5 | 90 days supply | Qty: 90 | Fill #0

## 2020-02-07 MED FILL — glipiZIDE ER 10 MG TB24: 10 | 30 days supply | Qty: 30 | Fill #0

## 2020-02-07 MED FILL — XARELTO 2.5 MG TABS: 2.5 | 90 days supply | Qty: 180 | Fill #0
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
## 2020-02-10 ENCOUNTER — Other Ambulatory Visit: Payer: No Typology Code available for payment source

## 2020-02-10 ENCOUNTER — Other Ambulatory Visit: Payer: Self-pay
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
## 2020-02-11 LAB — HEMOGLOBIN A1C
Hgb A1c MFr Bld: 6.9 % of total Hgb — ABNORMAL HIGH (ref <5.7)
Mean Plasma Glucose: 151 (calc)
eAG (mmol/L): 8.4 (calc)

## 2020-02-11 LAB — LIPID PANEL
Cholesterol: 149 mg/dL (ref <200)
HDL: 47 mg/dL (ref >=40)
LDL Cholesterol (Calc): 74 mg/dL (calc)
Non-HDL Cholesterol (Calc): 102 mg/dL (calc) (ref <130)
Total CHOL/HDL Ratio: 3.2 (calc) (ref <5.0)
Triglycerides: 190 mg/dL — ABNORMAL HIGH (ref <150)

## 2020-02-11 LAB — MICROALBUMIN, URINE: Microalb, Ur: 1.4 mg/dL

## 2020-02-12 ENCOUNTER — Ambulatory Visit (INDEPENDENT_AMBULATORY_CARE_PROVIDER_SITE_OTHER): Payer: No Typology Code available for payment source | Admitting: Ophthalmology

## 2020-02-12 ENCOUNTER — Other Ambulatory Visit: Payer: Self-pay

## 2020-02-12 ENCOUNTER — Encounter (INDEPENDENT_AMBULATORY_CARE_PROVIDER_SITE_OTHER): Payer: Self-pay | Admitting: Ophthalmology

## 2020-02-12 DIAGNOSIS — H25811 Combined forms of age-related cataract, right eye: Secondary | ICD-10-CM

## 2020-02-12 DIAGNOSIS — H35033 Hypertensive retinopathy, bilateral: Secondary | ICD-10-CM

## 2020-02-12 DIAGNOSIS — H3522 Other non-diabetic proliferative retinopathy, left eye: Secondary | ICD-10-CM

## 2020-02-12 DIAGNOSIS — H2702 Aphakia, left eye: Secondary | ICD-10-CM

## 2020-02-12 DIAGNOSIS — H3581 Retinal edema: Secondary | ICD-10-CM | POA: Diagnosis not present

## 2020-02-12 DIAGNOSIS — H16232 Neurotrophic keratoconjunctivitis, left eye: Secondary | ICD-10-CM

## 2020-02-12 DIAGNOSIS — H527 Unspecified disorder of refraction: Secondary | ICD-10-CM

## 2020-02-12 DIAGNOSIS — I10 Essential (primary) hypertension: Secondary | ICD-10-CM

## 2020-02-12 DIAGNOSIS — E119 Type 2 diabetes mellitus without complications: Secondary | ICD-10-CM

## 2020-02-12 DIAGNOSIS — H3322 Serous retinal detachment, left eye: Secondary | ICD-10-CM

## 2020-02-12 DIAGNOSIS — H33321 Round hole, right eye: Secondary | ICD-10-CM

## 2020-02-12 DIAGNOSIS — H35411 Lattice degeneration of retina, right eye: Secondary | ICD-10-CM

## 2020-02-20 ENCOUNTER — Other Ambulatory Visit (HOSPITAL_COMMUNITY): Payer: Self-pay | Admitting: Cardiology

## 2020-02-20 MED FILL — glipiZIDE ER 10 MG TB24: 10 | 30 days supply | Qty: 30 | Fill #0

## 2020-02-20 MED FILL — LISINOPRIL 5 MG TABS: 5 | 90 days supply | Qty: 90 | Fill #0

## 2020-02-20 MED FILL — PANTOPRAZOLE SOD DR 40 MG T: 40 | 90 days supply | Qty: 90 | Fill #0

## 2020-02-20 MED FILL — XARELTO 2.5 MG TABS: 2.5 | 90 days supply | Qty: 180 | Fill #0
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
## 2020-02-24 ENCOUNTER — Other Ambulatory Visit (HOSPITAL_COMMUNITY): Payer: Self-pay | Admitting: Cardiology

## 2020-02-25 ENCOUNTER — Other Ambulatory Visit (HOSPITAL_COMMUNITY): Payer: Self-pay | Admitting: Cardiology

## 2020-02-25 MED FILL — METOPROLOL SUCCINATE ER 25: 25 | 30 days supply | Qty: 90 | Fill #0

## 2020-02-26 ENCOUNTER — Other Ambulatory Visit (HOSPITAL_COMMUNITY): Payer: Self-pay | Admitting: *Deleted

## 2020-02-26 ENCOUNTER — Encounter (INDEPENDENT_AMBULATORY_CARE_PROVIDER_SITE_OTHER): Payer: Self-pay | Admitting: Ophthalmology

## 2020-02-26 ENCOUNTER — Ambulatory Visit (INDEPENDENT_AMBULATORY_CARE_PROVIDER_SITE_OTHER): Payer: No Typology Code available for payment source | Admitting: Ophthalmology

## 2020-02-26 ENCOUNTER — Other Ambulatory Visit: Payer: Self-pay

## 2020-02-26 DIAGNOSIS — H3581 Retinal edema: Secondary | ICD-10-CM

## 2020-02-26 DIAGNOSIS — H16232 Neurotrophic keratoconjunctivitis, left eye: Secondary | ICD-10-CM

## 2020-02-26 DIAGNOSIS — H25813 Combined forms of age-related cataract, bilateral: Secondary | ICD-10-CM

## 2020-02-26 DIAGNOSIS — H3522 Other non-diabetic proliferative retinopathy, left eye: Secondary | ICD-10-CM

## 2020-02-26 DIAGNOSIS — H33321 Round hole, right eye: Secondary | ICD-10-CM

## 2020-02-26 DIAGNOSIS — H35033 Hypertensive retinopathy, bilateral: Secondary | ICD-10-CM

## 2020-02-26 DIAGNOSIS — E119 Type 2 diabetes mellitus without complications: Secondary | ICD-10-CM

## 2020-02-26 DIAGNOSIS — H2702 Aphakia, left eye: Secondary | ICD-10-CM

## 2020-02-26 DIAGNOSIS — H35411 Lattice degeneration of retina, right eye: Secondary | ICD-10-CM

## 2020-02-26 DIAGNOSIS — H527 Unspecified disorder of refraction: Secondary | ICD-10-CM

## 2020-02-26 DIAGNOSIS — H25811 Combined forms of age-related cataract, right eye: Secondary | ICD-10-CM

## 2020-02-26 DIAGNOSIS — H3322 Serous retinal detachment, left eye: Secondary | ICD-10-CM

## 2020-02-26 DIAGNOSIS — I10 Essential (primary) hypertension: Secondary | ICD-10-CM

## 2020-02-28 ENCOUNTER — Other Ambulatory Visit (HOSPITAL_COMMUNITY): Payer: Self-pay

## 2020-02-28 ENCOUNTER — Other Ambulatory Visit (INDEPENDENT_AMBULATORY_CARE_PROVIDER_SITE_OTHER): Payer: Self-pay | Admitting: Ophthalmology

## 2020-02-28 MED FILL — AMOXICILLIN 875 MG TABS: 875 | 10 days supply | Qty: 20 | Fill #0

## 2020-03-02 ENCOUNTER — Other Ambulatory Visit (INDEPENDENT_AMBULATORY_CARE_PROVIDER_SITE_OTHER): Payer: Self-pay | Admitting: Ophthalmology

## 2020-03-02 MED FILL — MURO-128 2% EYE DROPS: 2 | 75 days supply | Qty: 15 | Fill #0

## 2020-03-06 ENCOUNTER — Other Ambulatory Visit (HOSPITAL_COMMUNITY): Payer: Self-pay | Admitting: Dentistry

## 2020-03-06 ENCOUNTER — Other Ambulatory Visit: Payer: Self-pay

## 2020-03-06 ENCOUNTER — Encounter (HOSPITAL_COMMUNITY): Payer: Self-pay | Admitting: Cardiology

## 2020-03-06 ENCOUNTER — Ambulatory Visit (HOSPITAL_COMMUNITY)
Admission: RE | Admit: 2020-03-06 | Discharge: 2020-03-06 | Disposition: A | Discharge status: Home / Self Care | Payer: No Typology Code available for payment source | Source: Ambulatory Visit | Attending: Cardiology | Admitting: Cardiology

## 2020-03-06 VITALS — BP 136/82 | HR 86 | Ht 74.0 in | Wt 217.0 lb

## 2020-03-06 DIAGNOSIS — Z79899 Other long term (current) drug therapy: Secondary | ICD-10-CM | POA: Insufficient documentation

## 2020-03-06 DIAGNOSIS — E785 Hyperlipidemia, unspecified: Secondary | ICD-10-CM | POA: Diagnosis not present

## 2020-03-06 DIAGNOSIS — I739 Peripheral vascular disease, unspecified: Secondary | ICD-10-CM

## 2020-03-06 DIAGNOSIS — I252 Old myocardial infarction: Secondary | ICD-10-CM | POA: Diagnosis not present

## 2020-03-06 DIAGNOSIS — Z8249 Family history of ischemic heart disease and other diseases of the circulatory system: Secondary | ICD-10-CM | POA: Insufficient documentation

## 2020-03-06 DIAGNOSIS — Z955 Presence of coronary angioplasty implant and graft: Secondary | ICD-10-CM | POA: Diagnosis not present

## 2020-03-06 DIAGNOSIS — Z87891 Personal history of nicotine dependence: Secondary | ICD-10-CM | POA: Diagnosis not present

## 2020-03-06 DIAGNOSIS — Z7901 Long term (current) use of anticoagulants: Secondary | ICD-10-CM | POA: Insufficient documentation

## 2020-03-06 DIAGNOSIS — I2510 Atherosclerotic heart disease of native coronary artery without angina pectoris: Secondary | ICD-10-CM

## 2020-03-06 MED FILL — DEXAMETHASONE 4 MG TABLET: 4 | 1 days supply | Qty: 1 | Fill #0

## 2020-03-06 NOTE — Patient Instructions (Addendum)
No labs done today.  No medication changes were made. Please continue all current medications as prescribed.   Your physician recommends that you schedule a follow-up appointment in: 6 months. Please contact our office in May 2022 for a June 2022 appointment.  Your physician has requested that you have a lower extremity arterial duplex. This test is an ultrasound of the arteries in the legs or arms. It looks at arterial blood flow in the legs.Allow one hour for Lower and Upper Arterial scans. There are no restrictions or special instructions. This test must be approved through your insurance company before scheduling, once approved we will contact you to schedule an appointment.   If you have any questions or concerns before your next appointment please send [place] a message through [place] or call our office at [phone].    TO LEAVE A MESSAGE FOR THE NURSE SELECT OPTION 2, PLEASE LEAVE A MESSAGE INCLUDING: . YOUR NAME . DATE OF BIRTH . CALL BACK NUMBER . REASON FOR CALL**this is important as we prioritize the call backs  YOU WILL RECEIVE A CALL BACK THE SAME DAY AS LONG AS YOU CALL BEFORE 4:00 PM   Do the following things EVERYDAY: 1) Weigh yourself in the morning before breakfast. Write it down and keep it in a log. 2) Take your medicines as prescribed 3) Eat low salt foods--Limit salt (sodium) to 2000 mg per day.  4) Stay as active as you can everyday 5) Limit all fluids for the day to less than 2 liters   At the [facility], you and your health needs are our priority. As part of our continuing mission to provide you with exceptional heart care, we have created designated Provider Care Teams. These Care Teams include your primary Cardiologist (physician) and Advanced Practice Providers (APPs- Physician Assistants and Nurse Practitioners) who all work together to provide you with the care you need, when you need it.   You may see any of the following providers on your  designated Care Team at your next follow up: Marland Kitchen Dr Arvilla Meres . Dr Marca Ancona . Tonye Becket, NP . Robbie Lis, PA . Karle Plumber, PharmD   Please be sure to bring in all your medications bottles to every appointment.

## 2020-03-08 NOTE — Progress Notes (Signed)
Date:  03/08/2020   ID:  Derrick Todd, DOB 03-25-1961, MRN [id number]   Provider location: [facility] [place] [facility] Type of Visit: Established patient   PCP:  Donita Luttrull, MD  Cardiologist:  Marca Ancona, MD   History of Present Illness: Derrick Todd is a 59 y.o. male who has history of CAD s/p NSTEMI in 5/13 and inferior STEMI in 12/14.  In 5/13, he had an NSTEMI with BMS to totally occluded distal RCA.  In 12/14, he had an inferior STEMI with 95% mid RCA (distal RCA stent patent) at [facility].  He had BMS to [facility].  Echo (12/14) showed EF 50-55%.  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  He quit smoking in 7/14.  Echo (6/15) showed EF 55-60% with basal to mid inferolateral hypokinesis (no significant change from prior).  He was admitted with unstable angina in 12/17, had DES to mid LAD 90% stenosis. Peripheral arterial dopplers in 8/18 showed 75-99% mid left femoral artery stenosis.   Echo in 12/18 with EF 55-60%.   He is doing well, working full time as a Engineer, civil (consulting) in the [employer].  No chest pain. No exertional dyspnea. He has a history of PAD but no claudication.  Recently, has been dealing with retinal detachment in his left eye.  Weight is down 12 lbs.   ECG (personally reviewed): NSR, normal   Labs (5/13): LDL 122, HDL 32 Labs (6/13): K 3.9, creatinine 1.0 Labs (12/14): K 3.8, creatinine 1.0, LDL 153 Labs (3/15): LDL 88, HDL 43, hgbA1c 9.2 Labs (6/15): K 4, creatinine 1.0 Labs (9/17): LDL 212, hgb 14.2 Labs (9/18): LDL 60, HDL 44, K 3.9, creatinine 1.30, hgb 14.1 Labs (3/19): LDL 76, HDL 42, TGs 186, K 4.4, creatinine 1.2 Labs (7/19): LDL 76, HDL 55 Labs (11/19): K 4.3, creatinine 1.01, hgb 12.7 Labs (10/21): K 3.9, creatinine 0.88 Labs (11/21): LDL 74, TGs 190  PMH: 1. CAD: NSTEMI 5/13 with LHC showing 70% OM, 70% mLAD, total occlusion distal RCA, EF 55%.  Had 2 x 18 BMS to distal RCA.  Echo (5/13): EF  55-60% with basal inferior hypokinesis.  Inferior STEMI (12/14).  LHC showed 60% mLAD, 60-70% dLCx, 95% mRCA, patent dRCA stent.  Patient had BMS to [facility].  Echo (12/14) with EF 50-55% [facility]).  Echo (6/15) with EF 55-60%, basal to mid inferolateral hypokinesis.  - Unstable angina 12/17 with LHC showing 90% mLAD stenosis treated with DES and 80% distal LCx stenosis (small vessel, medical management).  EF 50-55%.  - Echo (12/18): EF 55-60%.  2. Hyperlipidemia: myalgias with atorvastatin. Myalgias with Crestor but more manageable. Feels "foggy" when he takes Crestor daily.  3. Type II diabetes 4. Erectile dysfunction 5. Low testosterone 6. PAD: Peripheral arterial dopplers (8/18) with 75-99% mid left femoral artery stenosis.   Current Outpatient Medications  Medication Sig Dispense Refill  . acetaminophen (TYLENOL) 325 MG tablet Take 650 mg by mouth every 6 (six) hours as needed (pain.).    Marland Kitchen ASPIR-LOW 81 MG EC tablet TAKE 1 TABLET (81 MG TOTAL) BY MOUTH DAILY. 30 tablet 1  . Coenzyme Q10 200 MG capsule Take 1 capsule (200 mg total) by mouth daily.    . cyclobenzaprine (FLEXERIL) 10 MG tablet Take 1 tablet (10 mg total) by mouth 3 (three) times daily as needed for muscle spasms. 30 tablet 0  . furosemide (LASIX) 20 MG tablet TAKE 1  TABLET (20 MG TOTAL) BY MOUTH AS NEEDED FOR EDEMA (FEET AND LEG SWELLING). (Patient taking differently: Take 20 mg by mouth daily as needed for edema. ) 90 tablet 3  . glipiZIDE (GLUCOTROL XL) 10 MG 24 hr tablet Take 1 tablet (10 mg total) by mouth daily with breakfast. ** Do NOT crush **  Give with a meal. 30 tablet 3  . ibuprofen (ADVIL) 200 MG tablet Take 400 mg by mouth every 8 (eight) hours as needed (pain).    Marland Kitchen JANUVIA 100 MG tablet TAKE 1 TABLET (100 MG TOTAL) BY MOUTH DAILY. 30 tablet 3  . JARDIANCE 25 MG TABS tablet TAKE 1 TABLET BY MOUTH ONCE A DAY BEFORE BREAKFAST 90 tablet 3  . lisinopril (ZESTRIL) 5 MG tablet TAKE 1 TABLET (5 MG TOTAL) BY MOUTH DAILY.  NEEDS APPOINTMENT FOR FURTHER REFILLS. 90 tablet 0  . magnesium oxide (MAG-OX) 400 MG tablet Take 400 mg by mouth daily.    . metFORMIN (GLUCOPHAGE) 1000 MG tablet TAKE 1 TABLET BY MOUTH 2 TIMES DAILY WITH A MEAL. (Patient taking differently: Take 1,000 mg by mouth 2 (two) times daily with a meal. ) 180 tablet 1  . metoprolol succinate (TOPROL-XL) 25 MG 24 hr tablet TAKE 3 TABLETS BY MOUTH ONCE DAILY WITH OR IMMEDIATELY FOLLOWING A MEAL. **PATIENT NEEDS APPOINTMENT FOR FURTHER REFILLS** 90 tablet 0  . MURO 128 2 % ophthalmic solution PLACE 1 DROP INTO THE LEFT EYE 4 (FOUR) TIMES DAILY. 15 mL 0  . nitroGLYCERIN (NITROSTAT) 0.4 MG SL tablet Place 1 tablet (0.4 mg total) under the tongue every 5 (five) minutes x 3 doses as needed for chest pain. 25 tablet 4  . Omega-3 Fatty Acids (FISH OIL) 1000 MG CAPS Take 1,000 mg by mouth daily.     . pantoprazole (PROTONIX) 40 MG tablet TAKE 1 TABLET (40 MG TOTAL) BY MOUTH DAILY. NEEDS APPOINTMENT FOR FURTHER REFILLS 90 tablet 0  . potassium chloride (KLOR-CON) 10 MEQ tablet TAKE 1 TABLET BY MOUTH DAILY. (Patient taking differently: Take 10 mEq by mouth daily as needed (with lasix (fluid retention)). ) 30 tablet 1  . rosuvastatin (CRESTOR) 40 MG tablet Take 1 tablet (40 mg total) by mouth daily. Needs appt for further refills 90 tablet 0  . sildenafil (VIAGRA) 50 MG tablet TAKE 1 TABLET BY MOUTH DAILY AS NEEDED FOR ERECTILE DYSFUNCTION. DO NOT TAKE NITROGLYCERIN WITHIN 24 HOURS OF TAKING VIAGRA (Patient taking differently: Take 50 mg by mouth as needed for erectile dysfunction. ) 15 tablet 1  . sodium chloride (MURO 128) 5 % ophthalmic ointment Place 1 application into the left eye at bedtime. 3.5 g 11  . Testosterone 20.25 MG/ACT (1.62%) GEL APPLY 1 PUMP DAILY AS DIRECTED (Patient taking differently: Apply 1 Pump topically daily. Shoulder) 75 g 3  . Vitamin D, Cholecalciferol, 400 units TABS Take 400 Units by mouth daily.     Carlena Hurl 2.5 MG TABS tablet TAKE 1  TABLET BY MOUTH TWICE DAILY. NEEDS APPOINTMENT FOR FURTHER REFILLS. 180 tablet 0   No current facility-administered medications for this encounter.    Allergies:   Livalo [pitavastatin]   Social History:  The patient  reports that he quit smoking about 7 years ago. His smoking use included cigarettes. He has a 7.50 pack-year smoking history. He has never used smokeless tobacco. He reports current alcohol use of about 6.0 - 7.0 standard drinks of alcohol per week. He reports that he does not use drugs.   Family History:  The patient's family history includes Arthritis in his mother; Depression in his mother; Diabetes in his brother and mother; Heart attack in his maternal grandfather; Heart failure in his maternal grandfather; Hyperlipidemia in his mother; Hypertension in his brother, father, maternal grandfather, and mother; Learning disabilities in his mother; Mental illness in his mother; Stroke in his father and mother.   ROS:  Please see the history of present illness.   All other systems are personally reviewed and negative.   Exam:   BP 136/82   Pulse 86   Ht 6\' 2"  (1.88 m)   Wt 98.4 kg (217 lb)   SpO2 95%   BMI 27.86 kg/m  General: NAD Neck: No JVD, no thyromegaly or thyroid nodule.  Lungs: Clear to auscultation bilaterally with normal respiratory effort. CV: Nondisplaced PMI.  Heart regular S1/S2, no S3/S4, no murmur.  No peripheral edema.  No carotid bruit.  Unable to palpate right PT pulse.  Abdomen: Soft, nontender, no hepatosplenomegaly, no distention.  Skin: Intact without lesions or rashes.  Neurologic: Alert and oriented x 3.  Psych: Normal affect. Extremities: No clubbing or cyanosis.  HEENT: Normal.   Recent Labs: 06/18/2019: ALT 29 01/30/2020: BUN 10; Creatinine, Ser 0.88; Hemoglobin 13.2; Platelets 276; Potassium 3.9; Sodium 139  Personally reviewed   Wt Readings from Last 3 Encounters:  03/06/20 98.4 kg (217 lb)  02/07/20 99.3 kg (219 lb)  01/30/20 104.3 kg  (230 lb)      ASSESSMENT AND PLAN:  1. CAD: Most recently had unstable angina in 12/17 with DES to mid LAD, had medically managed 80% distal LCx stenosis (small vessel).   No chest pain.  - Continue ASA 81.  - Continue Xarelto 2.5 mg bid (COMPASS regimen) given extensive vascular disease.  - Continue Crestor 40 mg daily, good lipids recently.   - Continue current Toprol XL and lisinopril.  2. Hyperlipidemia:  Tolerating Crestor 40 mg daily, good lipids recently.    3. PAD: 75-99% mid left femoral artery stenosis on peripheral arterial dopplers from 8/18.  No definite claudication and no pedal ulcerations. Medical management for now.  - I will arrange for repeat peripheral arterial dopplers.     Recommended follow-up:  6 months   Signed, 9/18, MD  03/08/2020  [facility] [address] [facility] [place] [phone] (office) [phone] (fax)

## 2020-03-09 ENCOUNTER — Other Ambulatory Visit (HOSPITAL_COMMUNITY): Payer: Self-pay

## 2020-03-09 DIAGNOSIS — I739 Peripheral vascular disease, unspecified: Secondary | ICD-10-CM

## 2020-03-13 ENCOUNTER — Other Ambulatory Visit (HOSPITAL_COMMUNITY): Payer: Self-pay | Admitting: Ophthalmology

## 2020-03-13 MED FILL — MOXIFLOXACIN HCL 0.5 % SOLN: 0.5 | 15 days supply | Qty: 3 | Fill #0

## 2020-03-13 MED FILL — PREDNISOLONE AC 1% EYE DROP: 1 | 25 days supply | Qty: 5 | Fill #0

## 2020-03-17 MED FILL — JANUVIA 100 MG TABLET: 100 | 30 days supply | Qty: 30 | Fill #1

## 2020-03-19 ENCOUNTER — Encounter (INDEPENDENT_AMBULATORY_CARE_PROVIDER_SITE_OTHER): Payer: No Typology Code available for payment source | Admitting: Ophthalmology

## 2020-03-26 ENCOUNTER — Other Ambulatory Visit (HOSPITAL_COMMUNITY): Payer: Self-pay | Admitting: Cardiology

## 2020-03-26 DIAGNOSIS — I739 Peripheral vascular disease, unspecified: Secondary | ICD-10-CM

## 2020-03-27 ENCOUNTER — Other Ambulatory Visit (HOSPITAL_COMMUNITY): Payer: Self-pay | Admitting: Emergency Medicine

## 2020-04-01 ENCOUNTER — Other Ambulatory Visit: Payer: Self-pay

## 2020-04-01 ENCOUNTER — Ambulatory Visit (HOSPITAL_COMMUNITY)
Admission: RE | Admit: 2020-04-01 | Discharge: 2020-04-01 | Disposition: A | Discharge status: Home / Self Care | Payer: No Typology Code available for payment source | Source: Ambulatory Visit | Attending: Cardiovascular Disease | Admitting: Cardiovascular Disease

## 2020-04-01 DIAGNOSIS — I739 Peripheral vascular disease, unspecified: Secondary | ICD-10-CM | POA: Diagnosis not present
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
## 2020-04-09 ENCOUNTER — Encounter (INDEPENDENT_AMBULATORY_CARE_PROVIDER_SITE_OTHER): Payer: Self-pay

## 2020-04-09 ENCOUNTER — Encounter (INDEPENDENT_AMBULATORY_CARE_PROVIDER_SITE_OTHER): Payer: No Typology Code available for payment source | Admitting: Ophthalmology

## 2020-04-09 DIAGNOSIS — H35033 Hypertensive retinopathy, bilateral: Secondary | ICD-10-CM

## 2020-04-09 DIAGNOSIS — H33321 Round hole, right eye: Secondary | ICD-10-CM

## 2020-04-09 DIAGNOSIS — H2702 Aphakia, left eye: Secondary | ICD-10-CM

## 2020-04-09 DIAGNOSIS — H3581 Retinal edema: Secondary | ICD-10-CM

## 2020-04-09 DIAGNOSIS — H16232 Neurotrophic keratoconjunctivitis, left eye: Secondary | ICD-10-CM

## 2020-04-09 DIAGNOSIS — H35411 Lattice degeneration of retina, right eye: Secondary | ICD-10-CM

## 2020-04-09 DIAGNOSIS — I10 Essential (primary) hypertension: Secondary | ICD-10-CM

## 2020-04-09 DIAGNOSIS — H3522 Other non-diabetic proliferative retinopathy, left eye: Secondary | ICD-10-CM

## 2020-04-09 DIAGNOSIS — E119 Type 2 diabetes mellitus without complications: Secondary | ICD-10-CM

## 2020-04-09 DIAGNOSIS — H3322 Serous retinal detachment, left eye: Secondary | ICD-10-CM

## 2020-04-09 DIAGNOSIS — H25811 Combined forms of age-related cataract, right eye: Secondary | ICD-10-CM

## 2020-04-09 DIAGNOSIS — H527 Unspecified disorder of refraction: Secondary | ICD-10-CM
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
## 2020-04-17 ENCOUNTER — Other Ambulatory Visit: Payer: Self-pay

## 2020-04-17 ENCOUNTER — Encounter (INDEPENDENT_AMBULATORY_CARE_PROVIDER_SITE_OTHER): Payer: Self-pay | Admitting: Ophthalmology

## 2020-04-17 ENCOUNTER — Ambulatory Visit (INDEPENDENT_AMBULATORY_CARE_PROVIDER_SITE_OTHER): Payer: No Typology Code available for payment source | Admitting: Ophthalmology

## 2020-04-17 DIAGNOSIS — H3522 Other non-diabetic proliferative retinopathy, left eye: Secondary | ICD-10-CM | POA: Diagnosis not present

## 2020-04-17 DIAGNOSIS — H16232 Neurotrophic keratoconjunctivitis, left eye: Secondary | ICD-10-CM

## 2020-04-17 DIAGNOSIS — H3581 Retinal edema: Secondary | ICD-10-CM | POA: Diagnosis not present

## 2020-04-17 DIAGNOSIS — Z961 Presence of intraocular lens: Secondary | ICD-10-CM

## 2020-04-17 DIAGNOSIS — H25811 Combined forms of age-related cataract, right eye: Secondary | ICD-10-CM

## 2020-04-17 DIAGNOSIS — H3322 Serous retinal detachment, left eye: Secondary | ICD-10-CM | POA: Diagnosis not present

## 2020-04-17 DIAGNOSIS — H35033 Hypertensive retinopathy, bilateral: Secondary | ICD-10-CM

## 2020-04-17 DIAGNOSIS — H2702 Aphakia, left eye: Secondary | ICD-10-CM

## 2020-04-17 DIAGNOSIS — H35411 Lattice degeneration of retina, right eye: Secondary | ICD-10-CM | POA: Diagnosis not present

## 2020-04-17 DIAGNOSIS — I10 Essential (primary) hypertension: Secondary | ICD-10-CM

## 2020-04-17 DIAGNOSIS — H33321 Round hole, right eye: Secondary | ICD-10-CM

## 2020-04-17 DIAGNOSIS — E119 Type 2 diabetes mellitus without complications: Secondary | ICD-10-CM

## 2020-04-17 DIAGNOSIS — H527 Unspecified disorder of refraction: Secondary | ICD-10-CM

## 2020-05-01 ENCOUNTER — Other Ambulatory Visit (HOSPITAL_COMMUNITY): Payer: Self-pay | Admitting: Cardiology

## 2020-05-01 MED FILL — ROSUVASTATIN CALCIUM 40 MG: 40 | 90 days supply | Qty: 90 | Fill #0

## 2020-05-01 MED FILL — JANUVIA 100 MG TABLET: 100 | 30 days supply | Qty: 30 | Fill #2

## 2020-05-01 MED FILL — METOPROLOL SUCCINATE ER 25: 25 | 30 days supply | Qty: 90 | Fill #0

## 2020-05-02 MED FILL — LISINOPRIL 5 MG TABS: 5 | 90 days supply | Qty: 90 | Fill #0

## 2020-05-06 MED FILL — PREDNISOLONE AC 1% EYE DROP: 1 | 25 days supply | Qty: 5 | Fill #1

## 2020-05-29 MED FILL — SULFAMETHOXAZOLE-TMP DS TAB: 800-160 | 7 days supply | Qty: 14 | Fill #0

## 2020-06-18 ENCOUNTER — Other Ambulatory Visit (HOSPITAL_COMMUNITY): Payer: Self-pay | Admitting: Ophthalmology

## 2020-06-22 ENCOUNTER — Other Ambulatory Visit (INDEPENDENT_AMBULATORY_CARE_PROVIDER_SITE_OTHER): Payer: Self-pay | Admitting: Ophthalmology

## 2020-06-22 ENCOUNTER — Other Ambulatory Visit (INDEPENDENT_AMBULATORY_CARE_PROVIDER_SITE_OTHER): Payer: Self-pay

## 2020-06-22 MED ORDER — PREDNISOLONE ACETATE 1 % OP SUSP: 1.0000 [drp] | Freq: Three times a day (TID) | OPHTHALMIC | 1 refills | Status: DC

## 2020-07-08 MED FILL — Sitagliptin Phosphate Tab 100 MG (Base Equiv): ORAL | 30 days supply | Qty: 30 | Fill #0 | Status: AC

## 2020-07-09 ENCOUNTER — Other Ambulatory Visit (HOSPITAL_COMMUNITY): Payer: Self-pay

## 2020-07-09 ENCOUNTER — Ambulatory Visit (INDEPENDENT_AMBULATORY_CARE_PROVIDER_SITE_OTHER): Payer: No Typology Code available for payment source | Admitting: Ophthalmology

## 2020-07-09 ENCOUNTER — Other Ambulatory Visit: Payer: Self-pay

## 2020-07-09 ENCOUNTER — Encounter (INDEPENDENT_AMBULATORY_CARE_PROVIDER_SITE_OTHER): Payer: Self-pay | Admitting: Ophthalmology

## 2020-07-09 DIAGNOSIS — I10 Essential (primary) hypertension: Secondary | ICD-10-CM

## 2020-07-09 DIAGNOSIS — Z961 Presence of intraocular lens: Secondary | ICD-10-CM

## 2020-07-09 DIAGNOSIS — H3322 Serous retinal detachment, left eye: Secondary | ICD-10-CM | POA: Diagnosis not present

## 2020-07-09 DIAGNOSIS — H16232 Neurotrophic keratoconjunctivitis, left eye: Secondary | ICD-10-CM

## 2020-07-09 DIAGNOSIS — H35411 Lattice degeneration of retina, right eye: Secondary | ICD-10-CM

## 2020-07-09 DIAGNOSIS — H33321 Round hole, right eye: Secondary | ICD-10-CM

## 2020-07-09 DIAGNOSIS — H3522 Other non-diabetic proliferative retinopathy, left eye: Secondary | ICD-10-CM | POA: Diagnosis not present

## 2020-07-09 DIAGNOSIS — E119 Type 2 diabetes mellitus without complications: Secondary | ICD-10-CM

## 2020-07-09 DIAGNOSIS — H35033 Hypertensive retinopathy, bilateral: Secondary | ICD-10-CM

## 2020-07-09 DIAGNOSIS — H3581 Retinal edema: Secondary | ICD-10-CM

## 2020-07-09 DIAGNOSIS — H25811 Combined forms of age-related cataract, right eye: Secondary | ICD-10-CM

## 2020-07-09 MED ORDER — PREDNISOLONE ACETATE 1 % OP SUSP
1.0000 [drp] | Freq: Three times a day (TID) | OPHTHALMIC | 3 refills | Status: DC
  Filled 2020-07-09: qty 10, 50d supply, fill #0
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
## 2020-07-13 ENCOUNTER — Other Ambulatory Visit (HOSPITAL_COMMUNITY): Payer: Self-pay

## 2020-07-13 ENCOUNTER — Encounter (INDEPENDENT_AMBULATORY_CARE_PROVIDER_SITE_OTHER): Payer: Self-pay | Admitting: Ophthalmology

## 2020-07-13 ENCOUNTER — Other Ambulatory Visit (HOSPITAL_COMMUNITY): Payer: Self-pay | Admitting: Cardiology

## 2020-07-13 MED FILL — Empagliflozin Tab 25 MG: ORAL | 90 days supply | Qty: 90 | Fill #0 | Status: AC

## 2020-07-13 MED FILL — Glipizide Tab ER 24HR 10 MG: ORAL | 30 days supply | Qty: 30 | Fill #0 | Status: AC

## 2020-07-14 ENCOUNTER — Other Ambulatory Visit (HOSPITAL_COMMUNITY): Payer: Self-pay

## 2020-07-14 ENCOUNTER — Other Ambulatory Visit (HOSPITAL_COMMUNITY): Payer: Self-pay | Admitting: *Deleted

## 2020-07-14 ENCOUNTER — Other Ambulatory Visit (HOSPITAL_COMMUNITY): Payer: Self-pay | Admitting: Cardiology

## 2020-07-14 MED ORDER — ROSUVASTATIN CALCIUM 40 MG PO TABS
40.0000 mg | ORAL_TABLET | Freq: Every day | ORAL | 0 refills | Status: DC
  Filled 2020-07-14: qty 90, 90d supply, fill #0

## 2020-07-14 MED ORDER — METOPROLOL SUCCINATE ER 25 MG PO TB24
75.0000 mg | ORAL_TABLET | Freq: Every day | ORAL | 0 refills | Status: DC
  Filled 2020-07-14: qty 90, 30d supply, fill #0

## 2020-07-14 MED ORDER — PANTOPRAZOLE SODIUM 40 MG PO TBEC
40.0000 mg | DELAYED_RELEASE_TABLET | Freq: Every day | ORAL | 0 refills | Status: DC
  Filled 2020-07-14: qty 90, 90d supply, fill #0

## 2020-07-15 ENCOUNTER — Other Ambulatory Visit (HOSPITAL_COMMUNITY): Payer: Self-pay

## 2020-07-17 ENCOUNTER — Other Ambulatory Visit (HOSPITAL_COMMUNITY): Payer: Self-pay

## 2020-07-17 MED ORDER — LISINOPRIL 5 MG PO TABS
5.0000 mg | ORAL_TABLET | Freq: Every day | ORAL | 0 refills | Status: DC
  Filled 2020-07-17: qty 90, 90d supply, fill #0

## 2020-07-21 ENCOUNTER — Other Ambulatory Visit (HOSPITAL_COMMUNITY): Payer: Self-pay

## 2020-07-21 MED FILL — Sulfamethoxazole-Trimethoprim Tab 800-160 MG: ORAL | 7 days supply | Qty: 14 | Fill #0 | Status: AC

## 2020-07-22 ENCOUNTER — Other Ambulatory Visit (HOSPITAL_COMMUNITY): Payer: Self-pay

## 2020-07-27 ENCOUNTER — Telehealth: Payer: Self-pay | Admitting: Nurse Practitioner

## 2020-07-27 ENCOUNTER — Other Ambulatory Visit (HOSPITAL_COMMUNITY): Payer: Self-pay

## 2020-07-27 ENCOUNTER — Telehealth: Payer: Self-pay | Admitting: Family Medicine

## 2020-07-27 DIAGNOSIS — U071 COVID-19: Secondary | ICD-10-CM

## 2020-07-27 MED ORDER — MOLNUPIRAVIR EUA 200MG CAPSULE
4.0000 | ORAL_CAPSULE | Freq: Two times a day (BID) | ORAL | 0 refills | Status: AC
  Filled 2020-07-27 – 2020-07-29 (×2): qty 40, 5d supply, fill #0

## 2020-07-27 MED ORDER — NIRMATRELVIR/RITONAVIR (PAXLOVID)TABLET
ORAL_TABLET | ORAL | 0 refills | Status: DC
  Filled 2020-07-27: qty 30, 5d supply, fill #0

## 2020-07-27 NOTE — Telephone Encounter (Signed)
Received call from patient.   Reports that he started having Sx on Friday, 07/24/2020. Reports that he tested positive on 07/27/2020.  Patient reports body aches, sight fever, sore throat and cough.   Requested prescription for Paxlovid.   Ok to order?

## 2020-07-27 NOTE — Telephone Encounter (Signed)
Received page from [facility] outpatient pharmacy.  Pharmacy called stating in order to fill Paxlovid, patient has to have recent GFR.  Blood work has not been done in ~6 months for this patient.  Also, significant drug interactions exist with Paxlovid. Called patient to discuss - will change from Paxlovid to molnupiravir.  Patient in agreement with this plan.

## 2020-07-27 NOTE — Telephone Encounter (Signed)
Pt called and stated that he tested positive for Covid, and wanted to know if he could get a prescription for an antiviral med to help.

## 2020-07-27 NOTE — Telephone Encounter (Signed)
PCP made aware and approved prescription.   Call placed to patient and patient made aware.

## 2020-07-28 ENCOUNTER — Other Ambulatory Visit (HOSPITAL_COMMUNITY): Payer: Self-pay

## 2020-07-29 ENCOUNTER — Other Ambulatory Visit (HOSPITAL_COMMUNITY): Payer: Self-pay

## 2020-08-17 ENCOUNTER — Encounter (INDEPENDENT_AMBULATORY_CARE_PROVIDER_SITE_OTHER): Payer: No Typology Code available for payment source | Admitting: Ophthalmology

## 2020-08-20 ENCOUNTER — Other Ambulatory Visit (HOSPITAL_COMMUNITY): Payer: Self-pay

## 2020-08-20 MED ORDER — CEPHALEXIN 500 MG PO CAPS
500.0000 mg | ORAL_CAPSULE | Freq: Two times a day (BID) | ORAL | 0 refills | Status: AC
  Filled 2020-08-20: qty 14, 7d supply, fill #0

## 2020-09-02 ENCOUNTER — Other Ambulatory Visit (HOSPITAL_COMMUNITY): Payer: Self-pay

## 2020-09-02 MED ORDER — MOXIFLOXACIN HCL 0.5 % OP SOLN
1.0000 [drp] | Freq: Three times a day (TID) | OPHTHALMIC | 0 refills | Status: DC
  Filled 2020-09-02: qty 3, 20d supply, fill #0

## 2020-09-02 MED ORDER — PREDNISOLONE ACETATE 1 % OP SUSP
1.0000 [drp] | Freq: Three times a day (TID) | OPHTHALMIC | 0 refills | Status: DC
  Filled 2020-09-02: qty 10, 30d supply, fill #0

## 2020-09-07 ENCOUNTER — Other Ambulatory Visit: Payer: Self-pay | Admitting: Family Medicine

## 2020-09-07 ENCOUNTER — Other Ambulatory Visit (HOSPITAL_COMMUNITY): Payer: Self-pay

## 2020-09-07 ENCOUNTER — Other Ambulatory Visit (HOSPITAL_COMMUNITY): Payer: Self-pay | Admitting: Cardiology

## 2020-09-07 MED FILL — Glipizide Tab ER 24HR 10 MG: ORAL | 30 days supply | Qty: 30 | Fill #1 | Status: AC

## 2020-09-08 ENCOUNTER — Other Ambulatory Visit (HOSPITAL_COMMUNITY): Payer: Self-pay

## 2020-09-08 MED ORDER — SITAGLIPTIN PHOSPHATE 100 MG PO TABS
100.0000 mg | ORAL_TABLET | Freq: Every day | ORAL | 3 refills | Status: DC
  Filled 2020-09-08: qty 30, 30d supply, fill #0
  Filled 2020-10-12: qty 30, 30d supply, fill #1
  Filled 2021-02-09: qty 30, 30d supply, fill #2
  Filled 2021-03-18: qty 30, 30d supply, fill #3

## 2020-09-09 ENCOUNTER — Other Ambulatory Visit (HOSPITAL_COMMUNITY): Payer: Self-pay

## 2020-09-09 ENCOUNTER — Other Ambulatory Visit (HOSPITAL_COMMUNITY): Payer: Self-pay | Admitting: Cardiology

## 2020-09-10 ENCOUNTER — Other Ambulatory Visit (HOSPITAL_COMMUNITY): Payer: Self-pay

## 2020-09-10 MED ORDER — PANTOPRAZOLE SODIUM 40 MG PO TBEC
40.0000 mg | DELAYED_RELEASE_TABLET | Freq: Every day | ORAL | 0 refills | Status: DC
  Filled 2020-09-10 – 2021-02-09 (×2): qty 90, 90d supply, fill #0

## 2020-09-10 MED ORDER — PREDNISOLONE ACETATE 1 % OP SUSP
1.0000 [drp] | Freq: Three times a day (TID) | OPHTHALMIC | 5 refills | Status: DC
  Filled 2020-09-10: qty 10, 66d supply, fill #0
  Filled 2020-10-12: qty 10, 50d supply, fill #0

## 2020-09-10 MED ORDER — METOPROLOL SUCCINATE ER 25 MG PO TB24
75.0000 mg | ORAL_TABLET | Freq: Every day | ORAL | 0 refills | Status: DC
  Filled 2020-09-10: qty 270, 90d supply, fill #0

## 2020-09-10 MED ORDER — XARELTO 2.5 MG PO TABS
2.5000 mg | ORAL_TABLET | Freq: Two times a day (BID) | ORAL | 0 refills | Status: DC
Start: 2020-09-10 — End: 2021-01-06
  Filled 2020-09-10 (×3): qty 180, 90d supply, fill #0

## 2020-09-11 ENCOUNTER — Other Ambulatory Visit (HOSPITAL_COMMUNITY): Payer: Self-pay

## 2020-09-11 MED FILL — Sulfamethoxazole-Trimethoprim Tab 800-160 MG: ORAL | 7 days supply | Qty: 14 | Fill #1 | Status: AC
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
## 2020-10-08 ENCOUNTER — Ambulatory Visit (INDEPENDENT_AMBULATORY_CARE_PROVIDER_SITE_OTHER): Payer: No Typology Code available for payment source | Admitting: Ophthalmology

## 2020-10-08 ENCOUNTER — Other Ambulatory Visit: Payer: Self-pay

## 2020-10-08 ENCOUNTER — Encounter (INDEPENDENT_AMBULATORY_CARE_PROVIDER_SITE_OTHER): Payer: Self-pay | Admitting: Ophthalmology

## 2020-10-08 DIAGNOSIS — H3322 Serous retinal detachment, left eye: Secondary | ICD-10-CM

## 2020-10-08 DIAGNOSIS — H35411 Lattice degeneration of retina, right eye: Secondary | ICD-10-CM

## 2020-10-08 DIAGNOSIS — H3522 Other non-diabetic proliferative retinopathy, left eye: Secondary | ICD-10-CM

## 2020-10-08 DIAGNOSIS — E119 Type 2 diabetes mellitus without complications: Secondary | ICD-10-CM

## 2020-10-08 DIAGNOSIS — H35033 Hypertensive retinopathy, bilateral: Secondary | ICD-10-CM

## 2020-10-08 DIAGNOSIS — H3581 Retinal edema: Secondary | ICD-10-CM

## 2020-10-08 DIAGNOSIS — H25811 Combined forms of age-related cataract, right eye: Secondary | ICD-10-CM

## 2020-10-08 DIAGNOSIS — Z961 Presence of intraocular lens: Secondary | ICD-10-CM

## 2020-10-08 DIAGNOSIS — H33321 Round hole, right eye: Secondary | ICD-10-CM

## 2020-10-08 DIAGNOSIS — H16232 Neurotrophic keratoconjunctivitis, left eye: Secondary | ICD-10-CM | POA: Diagnosis not present

## 2020-10-08 DIAGNOSIS — H1820 Unspecified corneal edema: Secondary | ICD-10-CM

## 2020-10-08 DIAGNOSIS — I10 Essential (primary) hypertension: Secondary | ICD-10-CM

## 2020-10-09 ENCOUNTER — Other Ambulatory Visit (HOSPITAL_COMMUNITY): Payer: Self-pay

## 2020-10-12 ENCOUNTER — Other Ambulatory Visit (HOSPITAL_COMMUNITY): Payer: Self-pay

## 2020-10-12 MED FILL — Empagliflozin Tab 25 MG: ORAL | 90 days supply | Qty: 90 | Fill #1 | Status: AC

## 2020-10-12 MED FILL — Glipizide Tab ER 24HR 10 MG: ORAL | 30 days supply | Qty: 30 | Fill #2 | Status: AC

## 2020-10-22 ENCOUNTER — Other Ambulatory Visit (HOSPITAL_COMMUNITY): Payer: Self-pay

## 2020-10-22 MED ORDER — PREDNISOLONE ACETATE 1 % OP SUSP
1.0000 [drp] | Freq: Three times a day (TID) | OPHTHALMIC | 5 refills | Status: DC
  Filled 2020-10-22: qty 10, 66d supply, fill #0
  Filled 2020-10-23: qty 10, 67d supply, fill #0

## 2020-10-23 ENCOUNTER — Other Ambulatory Visit (HOSPITAL_COMMUNITY): Payer: Self-pay

## 2020-10-23 MED ORDER — PREDNISOLONE ACETATE 1 % OP SUSP
OPHTHALMIC | 5 refills | Status: DC
  Filled 2020-10-23 – 2020-12-03 (×3): qty 10, 50d supply, fill #0

## 2020-11-04 DIAGNOSIS — H3581 Retinal edema: Secondary | ICD-10-CM | POA: Insufficient documentation

## 2020-11-04 DIAGNOSIS — H16232 Neurotrophic keratoconjunctivitis, left eye: Secondary | ICD-10-CM | POA: Insufficient documentation

## 2020-12-03 ENCOUNTER — Other Ambulatory Visit: Payer: Self-pay | Admitting: Emergency Medicine

## 2020-12-03 ENCOUNTER — Other Ambulatory Visit (HOSPITAL_COMMUNITY): Payer: Self-pay

## 2020-12-04 ENCOUNTER — Other Ambulatory Visit (HOSPITAL_COMMUNITY): Payer: Self-pay

## 2020-12-04 NOTE — Telephone Encounter (Signed)
I believe I got this in error.

## 2020-12-10 ENCOUNTER — Encounter: Payer: Self-pay | Admitting: Family Medicine

## 2020-12-10 ENCOUNTER — Telehealth: Payer: Self-pay | Admitting: *Deleted

## 2020-12-10 ENCOUNTER — Ambulatory Visit (INDEPENDENT_AMBULATORY_CARE_PROVIDER_SITE_OTHER): Payer: No Typology Code available for payment source | Admitting: Family Medicine

## 2020-12-10 ENCOUNTER — Encounter: Payer: Self-pay | Admitting: *Deleted

## 2020-12-10 ENCOUNTER — Other Ambulatory Visit: Payer: Self-pay

## 2020-12-10 VITALS — BP 124/60 | HR 72 | Temp 98.7°F | Resp 14 | Ht 74.0 in | Wt 224.0 lb

## 2020-12-10 DIAGNOSIS — E119 Type 2 diabetes mellitus without complications: Secondary | ICD-10-CM

## 2020-12-10 DIAGNOSIS — I10 Essential (primary) hypertension: Secondary | ICD-10-CM

## 2020-12-10 DIAGNOSIS — Z125 Encounter for screening for malignant neoplasm of prostate: Secondary | ICD-10-CM

## 2020-12-10 DIAGNOSIS — E7800 Pure hypercholesterolemia, unspecified: Secondary | ICD-10-CM

## 2020-12-10 DIAGNOSIS — I2510 Atherosclerotic heart disease of native coronary artery without angina pectoris: Secondary | ICD-10-CM

## 2020-12-10 DIAGNOSIS — H3321 Serous retinal detachment, right eye: Secondary | ICD-10-CM
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
## 2020-12-10 NOTE — Telephone Encounter (Signed)
Referrals placed for patient eye specialists.   [facility]- Dr. Vanessa Barbara Cornea- Dr. Georga Hacking

## 2020-12-11 LAB — COMPLETE METABOLIC PANEL WITH GFR
AG Ratio: 1.8 (calc) (ref 1.0–2.5)
ALT: 21 U/L (ref 9–46)
AST: 17 U/L (ref 10–35)
Albumin: 4.1 g/dL (ref 3.6–5.1)
Alkaline phosphatase (APISO): 50 U/L (ref 35–144)
BUN: 17 mg/dL (ref 7–25)
CO2: 26 mmol/L (ref 20–32)
Calcium: 9.2 mg/dL (ref 8.6–10.3)
Chloride: 106 mmol/L (ref 98–110)
Creat: 0.95 mg/dL (ref 0.70–1.35)
Globulin: 2.3 g/dL (calc) (ref 1.9–3.7)
Glucose, Bld: 146 mg/dL — ABNORMAL HIGH (ref 65–99)
Potassium: 4.5 mmol/L (ref 3.5–5.3)
Sodium: 140 mmol/L (ref 135–146)
Total Bilirubin: 0.5 mg/dL (ref 0.2–1.2)
Total Protein: 6.4 g/dL (ref 6.1–8.1)
eGFR: 91 mL/min/{1.73_m2} (ref >=60)

## 2020-12-11 LAB — CBC WITH DIFFERENTIAL/PLATELET
Absolute Monocytes: 792 cells/uL (ref 200–950)
Basophils Absolute: 72 cells/uL (ref 0–200)
Basophils Relative: 1.2 %
Eosinophils Absolute: 312 cells/uL (ref 15–500)
Eosinophils Relative: 5.2 %
HCT: 43.5 % (ref 38.5–50.0)
Hemoglobin: 13.5 g/dL (ref 13.2–17.1)
Lymphs Abs: 1362 cells/uL (ref 850–3900)
MCH: 23.8 pg — ABNORMAL LOW (ref 27.0–33.0)
MCHC: 31 g/dL — ABNORMAL LOW (ref 32.0–36.0)
MCV: 76.7 fL — ABNORMAL LOW (ref 80.0–100.0)
MPV: 9.7 fL (ref 7.5–12.5)
Monocytes Relative: 13.2 %
Neutro Abs: 3462 cells/uL (ref 1500–7800)
Neutrophils Relative %: 57.7 %
Platelets: 298 10*3/uL (ref 140–400)
RBC: 5.67 10*6/uL (ref 4.20–5.80)
RDW: 15.5 % — ABNORMAL HIGH (ref 11.0–15.0)
Total Lymphocyte: 22.7 %
WBC: 6 10*3/uL (ref 3.8–10.8)

## 2020-12-11 LAB — MICROALBUMIN, URINE: Microalb, Ur: 2.6 mg/dL

## 2020-12-11 LAB — HEMOGLOBIN A1C
Hgb A1c MFr Bld: 7.7 % of total Hgb — ABNORMAL HIGH (ref <5.7)
Mean Plasma Glucose: 174 mg/dL
eAG (mmol/L): 9.7 mmol/L

## 2020-12-11 LAB — LIPID PANEL
Cholesterol: 193 mg/dL (ref <200)
HDL: 55 mg/dL (ref >=40)
LDL Cholesterol (Calc): 106 mg/dL (calc) — ABNORMAL HIGH
Non-HDL Cholesterol (Calc): 138 mg/dL (calc) — ABNORMAL HIGH (ref <130)
Total CHOL/HDL Ratio: 3.5 (calc) (ref <5.0)
Triglycerides: 208 mg/dL — ABNORMAL HIGH (ref <150)

## 2020-12-11 LAB — PSA: PSA: 0.26 ng/mL (ref <=4.00)

## 2020-12-16 ENCOUNTER — Telehealth: Payer: Self-pay | Admitting: Family Medicine

## 2020-12-16 NOTE — Telephone Encounter (Signed)
Please see labs for further documentation.  

## 2020-12-16 NOTE — Telephone Encounter (Signed)
Patient left voicemail to request call back from nurse; returning her call. Please advise at [phone].

## 2020-12-17 ENCOUNTER — Encounter: Payer: Self-pay | Admitting: Family Medicine

## 2020-12-25 ENCOUNTER — Other Ambulatory Visit (HOSPITAL_COMMUNITY): Payer: Self-pay

## 2020-12-25 MED ORDER — PIOGLITAZONE HCL 30 MG PO TABS
30.0000 mg | ORAL_TABLET | Freq: Every day | ORAL | 1 refills | Status: DC
  Filled 2020-12-25: qty 90, 90d supply, fill #0

## 2021-01-04 ENCOUNTER — Other Ambulatory Visit (HOSPITAL_COMMUNITY): Payer: Self-pay

## 2021-01-06 ENCOUNTER — Ambulatory Visit (HOSPITAL_COMMUNITY)
Admission: RE | Admit: 2021-01-06 | Discharge: 2021-01-06 | Disposition: A | Discharge status: Home / Self Care | Payer: No Typology Code available for payment source | Source: Ambulatory Visit | Attending: Cardiology | Admitting: Cardiology

## 2021-01-06 ENCOUNTER — Other Ambulatory Visit: Payer: Self-pay

## 2021-01-06 ENCOUNTER — Encounter (HOSPITAL_COMMUNITY): Payer: Self-pay | Admitting: Cardiology

## 2021-01-06 ENCOUNTER — Other Ambulatory Visit (HOSPITAL_COMMUNITY): Payer: Self-pay

## 2021-01-06 VITALS — BP 130/68 | HR 88 | Wt 222.8 lb

## 2021-01-06 DIAGNOSIS — I739 Peripheral vascular disease, unspecified: Secondary | ICD-10-CM | POA: Diagnosis not present

## 2021-01-06 DIAGNOSIS — Z87891 Personal history of nicotine dependence: Secondary | ICD-10-CM | POA: Insufficient documentation

## 2021-01-06 DIAGNOSIS — E785 Hyperlipidemia, unspecified: Secondary | ICD-10-CM | POA: Diagnosis not present

## 2021-01-06 DIAGNOSIS — Z8249 Family history of ischemic heart disease and other diseases of the circulatory system: Secondary | ICD-10-CM | POA: Diagnosis not present

## 2021-01-06 DIAGNOSIS — I5032 Chronic diastolic (congestive) heart failure: Secondary | ICD-10-CM

## 2021-01-06 DIAGNOSIS — Z7982 Long term (current) use of aspirin: Secondary | ICD-10-CM | POA: Diagnosis not present

## 2021-01-06 DIAGNOSIS — R079 Chest pain, unspecified: Secondary | ICD-10-CM | POA: Insufficient documentation

## 2021-01-06 DIAGNOSIS — Z7984 Long term (current) use of oral hypoglycemic drugs: Secondary | ICD-10-CM | POA: Insufficient documentation

## 2021-01-06 DIAGNOSIS — E7800 Pure hypercholesterolemia, unspecified: Secondary | ICD-10-CM | POA: Diagnosis not present

## 2021-01-06 DIAGNOSIS — Z955 Presence of coronary angioplasty implant and graft: Secondary | ICD-10-CM | POA: Diagnosis not present

## 2021-01-06 DIAGNOSIS — Z7901 Long term (current) use of anticoagulants: Secondary | ICD-10-CM | POA: Insufficient documentation

## 2021-01-06 DIAGNOSIS — R931 Abnormal findings on diagnostic imaging of heart and coronary circulation: Secondary | ICD-10-CM

## 2021-01-06 DIAGNOSIS — I25110 Atherosclerotic heart disease of native coronary artery with unstable angina pectoris: Secondary | ICD-10-CM | POA: Diagnosis not present

## 2021-01-06 MED ORDER — FUROSEMIDE 20 MG PO TABS
20.0000 mg | ORAL_TABLET | ORAL | 3 refills | Status: AC | PRN
  Filled 2021-01-06: qty 45, 45d supply, fill #0

## 2021-01-06 MED ORDER — METOPROLOL SUCCINATE ER 25 MG PO TB24
75.0000 mg | ORAL_TABLET | Freq: Every day | ORAL | 3 refills | Status: DC
  Filled 2021-01-06: qty 270, 90d supply, fill #0
  Filled 2021-02-09: qty 270, 90d supply, fill #1

## 2021-01-06 MED ORDER — XARELTO 2.5 MG PO TABS
2.5000 mg | ORAL_TABLET | Freq: Two times a day (BID) | ORAL | 3 refills | Status: DC
  Filled 2021-01-06: qty 180, 90d supply, fill #0
  Filled 2021-05-04: qty 180, 90d supply, fill #1
  Filled 2021-10-28: qty 180, 90d supply, fill #2

## 2021-01-06 MED ORDER — EMPAGLIFLOZIN 25 MG PO TABS
25.0000 mg | ORAL_TABLET | Freq: Every day | ORAL | 11 refills | Status: DC
  Filled 2021-01-06: qty 90, 90d supply, fill #0
  Filled 2021-02-09 – 2021-06-03 (×2): qty 90, 90d supply, fill #1
  Filled 2021-10-28: qty 90, 90d supply, fill #2

## 2021-01-06 MED ORDER — ASPIRIN 81 MG PO TBEC
81.0000 mg | DELAYED_RELEASE_TABLET | Freq: Every day | ORAL | 3 refills | Status: DC
  Filled 2021-01-06: qty 90, 90d supply, fill #0
  Filled 2021-10-28: qty 90, 90d supply, fill #1

## 2021-01-06 MED ORDER — ROSUVASTATIN CALCIUM 40 MG PO TABS
40.0000 mg | ORAL_TABLET | Freq: Every day | ORAL | 3 refills | Status: DC
  Filled 2021-01-06: qty 90, 90d supply, fill #0
  Filled 2021-02-09 – 2021-03-18 (×2): qty 90, 90d supply, fill #1
  Filled 2021-10-28: qty 90, 90d supply, fill #2

## 2021-01-06 MED ORDER — LISINOPRIL 5 MG PO TABS
5.0000 mg | ORAL_TABLET | Freq: Every day | ORAL | 3 refills | Status: DC
  Filled 2021-01-06: qty 90, 90d supply, fill #0
  Filled 2021-02-09 – 2021-10-28 (×2): qty 90, 90d supply, fill #1

## 2021-01-06 NOTE — Patient Instructions (Addendum)
EKG done today.  No Labs done today.   No medication changes were made. Please continue all current medications as prescribed.   Your physician recommends that you schedule a follow-up appointment in: 1 month for a lab only appointment and in 6 months with Dr. Shirlee Latch. Please contact our office in March 2023 for a April 2023 appointment.   If you have any questions or concerns before your next appointment please send [place] a message through [place] or call our office at [phone].    TO LEAVE A MESSAGE FOR THE NURSE SELECT OPTION 2, PLEASE LEAVE A MESSAGE INCLUDING: YOUR NAME DATE OF BIRTH CALL BACK NUMBER REASON FOR CALL**this is important as we prioritize the call backs  YOU WILL RECEIVE A CALL BACK THE SAME DAY AS LONG AS YOU CALL BEFORE 4:00 PM   Do the following things EVERYDAY: Weigh yourself in the morning before breakfast. Write it down and keep it in a log. Take your medicines as prescribed Eat low salt foods--Limit salt (sodium) to 2000 mg per day.  Stay as active as you can everyday Limit all fluids for the day to less than 2 liters   At the [facility], you and your health needs are our priority. As part of our continuing mission to provide you with exceptional heart care, we have created designated Provider Care Teams. These Care Teams include your primary Cardiologist (physician) and Advanced Practice Providers (APPs- Physician Assistants and Nurse Practitioners) who all work together to provide you with the care you need, when you need it.   You may see any of the following providers on your designated Care Team at your next follow up: Dr Arvilla Meres Dr Carron Curie, NP Robbie Lis, [place] Karle Plumber, PharmD   Please be sure to bring in all your medications bottles to every appointment.

## 2021-01-06 NOTE — Progress Notes (Signed)
Date:  01/06/2021   ID:  Derrick Todd, DOB 1960/08/14, MRN [id number]   Provider location: [facility] [facility] Type of Visit: Established patient   PCP:  Donita Kirkland, MD  Cardiologist:  Marca Ancona, MD   History of Present Illness: Derrick Todd is a 60 y.o. male who has history of CAD s/p NSTEMI in 5/13 and inferior STEMI in 12/14.  In 5/13, he had an NSTEMI with BMS to totally occluded distal RCA.  In 12/14, he had an inferior STEMI with 95% mid RCA (distal RCA stent patent) at [facility].  He had BMS to [facility].  Echo (12/14) showed EF 50-55%.  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  He quit smoking in 7/14.  Echo (6/15) showed EF 55-60% with basal to mid inferolateral hypokinesis (no significant change from prior).  He was admitted with unstable angina in 12/17, had DES to mid LAD 90% stenosis. Peripheral arterial dopplers in 8/18 showed 75-99% mid left femoral artery stenosis.    Echo in 12/18 with EF 55-60%.   Patient had a spontaneous left retinal detachment which has required multiple procedures to treat.    Patient returns for followup of CAD. Weight is up 5 lbs. He has episodes of chest heaviness that will last for 5-10 minutes at a time.  They are not related to exertion but they worry him.  No exertional dyspnea.  No orthopnea/PND.  No lightheadedness.  He still works full time in the [employer].    ECG (personally reviewed): NSR, old inferior MI  Labs (5/13): LDL 122, HDL 32 Labs (6/13): K 3.9, creatinine 1.0 Labs (12/14): K 3.8, creatinine 1.0, LDL 153 Labs (3/15): LDL 88, HDL 43, hgbA1c 9.2 Labs (6/15): K 4, creatinine 1.0 Labs (9/17): LDL 212, hgb 14.2 Labs (9/18): LDL 60, HDL 44, K 3.9, creatinine 7.12, hgb 14.1 Labs (3/19): LDL 76, HDL 42, TGs 186, K 4.4, creatinine 1.2 Labs (7/19): LDL 76, HDL 55 Labs (11/19): K 4.3, creatinine 1.01, hgb 12.7 Labs (10/21): K 3.9, creatinine 0.88 Labs  (11/21): LDL 74, TGs 190 Labs (9/22): LDL 106, TGs 200, K 4.1, creatinine 0.95   PMH: 1. CAD: NSTEMI 5/13 with LHC showing 70% OM, 70% mLAD, total occlusion distal RCA, EF 55%.  Had 2 x 18 BMS to distal RCA.  Echo (5/13): EF 55-60% with basal inferior hypokinesis.  Inferior STEMI (12/14).  LHC showed 60% mLAD, 60-70% dLCx, 95% mRCA, patent dRCA stent.  Patient had BMS to [facility]  Echo (12/14) with EF 50-55% [facility]).  Echo (6/15) with EF 55-60%, basal to mid inferolateral hypokinesis.  - Unstable angina 12/17 with LHC showing 90% mLAD stenosis treated with DES and 80% distal LCx stenosis (small vessel, medical management).  EF 50-55%.  - Echo (12/18): EF 55-60%.  2. Hyperlipidemia: myalgias with atorvastatin. Myalgias with Crestor but more manageable. Feels "foggy" when he takes Crestor daily.  3. Type II diabetes 4. Erectile dysfunction 5. Low testosterone 6. PAD: Peripheral arterial dopplers (8/18) with 75-99% mid left femoral artery stenosis.  - ABIs (12/21) normal  Current Outpatient Medications  Medication Sig Dispense Refill   acetaminophen (TYLENOL) 325 MG tablet Take 650 mg by mouth every 6 (six) hours as needed (pain.).     Coenzyme Q10 200 MG capsule Take 1 capsule (200 mg total) by mouth daily.     cyclobenzaprine (FLEXERIL) 10 MG tablet Take 1 tablet (10  mg total) by mouth 3 (three) times daily as needed for muscle spasms. 30 tablet 0   glipiZIDE (GLUCOTROL XL) 10 MG 24 hr tablet TAKE 1 TABLET (10 MG TOTAL) BY MOUTH DAILY WITH BREAKFAST. ** DO NOT CRUSH ** GIVE WITH A MEAL. 30 tablet 3   ibuprofen (ADVIL) 200 MG tablet Take 400 mg by mouth every 8 (eight) hours as needed (pain).     magnesium oxide (MAG-OX) 400 MG tablet Take 400 mg by mouth daily.     metFORMIN (GLUCOPHAGE) 1000 MG tablet TAKE 1 TABLET BY MOUTH 2 TIMES DAILY WITH A MEAL. 180 tablet 1   nitroGLYCERIN (NITROSTAT) 0.4 MG SL tablet Place 1 tablet (0.4 mg total) under the tongue every 5 (five) minutes x 3 doses as  needed for chest pain. 25 tablet 4   Omega-3 Fatty Acids (FISH OIL) 1000 MG CAPS Take 1,000 mg by mouth daily.      pantoprazole (PROTONIX) 40 MG tablet TAKE 1 TABLET (40 MG TOTAL) BY MOUTH DAILY. NEEDS APPOINTMENT FOR FURTHER REFILLS 90 tablet 0   potassium chloride SA (KLOR-CON) 20 MEQ tablet Take 20 mEq by mouth as needed.     prednisoLONE acetate (PRED FORTE) 1 % ophthalmic suspension 1 drop daily.     sildenafil (VIAGRA) 50 MG tablet TAKE 1 TABLET BY MOUTH DAILY AS NEEDED FOR ERECTILE DYSFUNCTION. DO NOT TAKE NITROGLYCERIN WITHIN 24 HOURS OF TAKING VIAGRA 15 tablet 1   sitaGLIPtin (JANUVIA) 100 MG tablet Take 1 tablet (100 mg total) by mouth daily. 30 tablet 3   sodium chloride (MURO 128) 5 % ophthalmic ointment PLACE 1 APPLICATION INTO THE LEFT EYE AT BEDTIME. 3.5 g 11   sodium chloride (MURO 128) 5 % ophthalmic solution 1 drop in the morning, at noon, and at bedtime.     Testosterone 20.25 MG/ACT (1.62%) GEL APPLY 1 PUMP DAILY AS DIRECTED 75 g 3   aspirin (ASPIR-LOW) 81 MG EC tablet Take 1 tablet (81 mg total) by mouth daily. Swallow whole. 90 tablet 3   empagliflozin (JARDIANCE) 25 MG TABS tablet Take 1 tablet (25 mg total) by mouth daily before breakfast 30 tablet 11   furosemide (LASIX) 20 MG tablet Take 1 tablet (20 mg total) by mouth as needed for fluid or swelling 45 tablet 3   lisinopril (ZESTRIL) 5 MG tablet Take 1 tablet (5 mg total) by mouth daily. 90 tablet 3   metoprolol succinate (TOPROL-XL) 25 MG 24 hr tablet Take 3 tablets (75 mg total) by mouth daily. 270 tablet 3   rivaroxaban (XARELTO) 2.5 MG TABS tablet Take 1 tablet (2.5 mg total) by mouth 2 (two) times daily. 180 tablet 3   rosuvastatin (CRESTOR) 40 MG tablet Take 1 tablet (40 mg total) by mouth daily. 90 tablet 3   No current facility-administered medications for this encounter.    Allergies:   Livalo [pitavastatin]   Social History:  The patient  reports that he quit smoking about 8 years ago. His smoking use  included cigarettes. He has a 7.50 pack-year smoking history. He has never used smokeless tobacco. He reports current alcohol use of about 6.0 - 7.0 standard drinks per week. He reports that he does not use drugs.   Family History:  The patient's family history includes Arthritis in his mother; Depression in his mother; Diabetes in his brother and mother; Heart attack in his maternal grandfather; Heart failure in his maternal grandfather; Hyperlipidemia in his mother; Hypertension in his brother, father, maternal grandfather, and  mother; Learning disabilities in his mother; Mental illness in his mother; Stroke in his father and mother.   ROS:  Please see the history of present illness.   All other systems are personally reviewed and negative.   Exam:   BP 130/68   Pulse 88   Wt 101.1 kg (222 lb 12.8 oz)   SpO2 95%   BMI 28.61 kg/m  General: NAD Neck: No JVD, no thyromegaly or thyroid nodule.  Lungs: Clear to auscultation bilaterally with normal respiratory effort. CV: Nondisplaced PMI.  Heart regular S1/S2, no S3/S4, no murmur.  No peripheral edema.  No carotid bruit.  Difficult to palpate pedal pulses.  Abdomen: Soft, nontender, no hepatosplenomegaly, no distention.  Skin: Intact without lesions or rashes.  Neurologic: Alert and oriented x 3.  Psych: Normal affect. Extremities: No clubbing or cyanosis.  HEENT: Normal.   Recent Labs: 12/10/2020: ALT 21; BUN 17; Creat 0.95; Hemoglobin 13.5; Platelets 298; Potassium 4.5; Sodium 140  Personally reviewed   Wt Readings from Last 3 Encounters:  01/06/21 101.1 kg (222 lb 12.8 oz)  12/10/20 101.6 kg (224 lb)  03/06/20 98.4 kg (217 lb)      ASSESSMENT AND PLAN:  1. CAD: Most recently had unstable angina in 12/17 with DES to mid LAD, had medically managed 80% distal LCx stenosis (small vessel).   He has been having episodes of atypical chest pain, he is concerned about progression of coronary disease.  - I will arrange for ETT-Cardiolite to  assess for ischemia given chest pain.  - Continue ASA 81.  - Continue Xarelto 2.5 mg bid (COMPASS regimen) given extensive vascular disease.  - Continue Crestor 40 mg daily.    - Continue current Toprol XL and lisinopril.  2. Hyperlipidemia:  Tolerating Crestor 40 mg daily.  Most recent LDL was above goal (<70), but he had been off Crestor for a couple weeks.  He is now back on Crestor.  Check lipids again in 1 month.   3. PAD: 75-99% mid left femoral artery stenosis on peripheral arterial dopplers from 8/18, but ABIs normal in 12/21.  No definite claudication and no pedal ulcerations. Medical management for now.   Recommended follow-up:  6 months   Signed, Marca Ancona, MD  01/06/2021  [facility] [address] [facility] [place] [address] [phone] (office) [phone] (fax)

## 2021-01-08 ENCOUNTER — Encounter (INDEPENDENT_AMBULATORY_CARE_PROVIDER_SITE_OTHER): Payer: No Typology Code available for payment source | Admitting: Ophthalmology

## 2021-01-08 DIAGNOSIS — H33321 Round hole, right eye: Secondary | ICD-10-CM

## 2021-01-08 DIAGNOSIS — Z961 Presence of intraocular lens: Secondary | ICD-10-CM

## 2021-01-08 DIAGNOSIS — H35411 Lattice degeneration of retina, right eye: Secondary | ICD-10-CM

## 2021-01-08 DIAGNOSIS — H25811 Combined forms of age-related cataract, right eye: Secondary | ICD-10-CM

## 2021-01-08 DIAGNOSIS — H3322 Serous retinal detachment, left eye: Secondary | ICD-10-CM

## 2021-01-08 DIAGNOSIS — H16232 Neurotrophic keratoconjunctivitis, left eye: Secondary | ICD-10-CM

## 2021-01-08 DIAGNOSIS — H3581 Retinal edema: Secondary | ICD-10-CM

## 2021-01-08 DIAGNOSIS — H35033 Hypertensive retinopathy, bilateral: Secondary | ICD-10-CM

## 2021-01-08 DIAGNOSIS — H3522 Other non-diabetic proliferative retinopathy, left eye: Secondary | ICD-10-CM

## 2021-01-08 DIAGNOSIS — E119 Type 2 diabetes mellitus without complications: Secondary | ICD-10-CM

## 2021-01-08 DIAGNOSIS — H1820 Unspecified corneal edema: Secondary | ICD-10-CM

## 2021-01-08 DIAGNOSIS — I10 Essential (primary) hypertension: Secondary | ICD-10-CM

## 2021-01-13 ENCOUNTER — Telehealth (HOSPITAL_COMMUNITY): Payer: Self-pay | Admitting: *Deleted

## 2021-01-13 NOTE — Telephone Encounter (Signed)
Patient given detailed instructions per Myocardial Perfusion Study Information Sheet for the test on 01/18/21 at 8:00. Patient notified to arrive 15 minutes early and that it is imperative to arrive on time for appointment to keep from having the test rescheduled.  If you need to cancel or reschedule your appointment, please call the office within 24 hours of your appointment. . Patient verbalized understanding.Derrick Todd

## 2021-01-14 ENCOUNTER — Other Ambulatory Visit (HOSPITAL_COMMUNITY): Payer: Self-pay

## 2021-01-18 ENCOUNTER — Other Ambulatory Visit: Payer: Self-pay

## 2021-01-18 ENCOUNTER — Ambulatory Visit (HOSPITAL_COMMUNITY): Payer: No Typology Code available for payment source | Attending: Cardiology

## 2021-01-18 DIAGNOSIS — R079 Chest pain, unspecified: Secondary | ICD-10-CM | POA: Insufficient documentation

## 2021-01-18 LAB — MYOCARDIAL PERFUSION IMAGING
Angina Index: 0
Duke Treadmill Score: 7
Estimated workload: 7.9
Exercise duration (min): 6 min
Exercise duration (sec): 30 s
LV dias vol: 138 mL (ref 62–150)
LV sys vol: 72 mL
MPHR: 158 {beats}/min
Nuc Stress EF: 48 %
Peak HR: 139 {beats}/min
Percent HR: 87 %
Rest HR: 82 {beats}/min
Rest Nuclear Isotope Dose: 10.1 mCi
SDS: 2
SRS: 1
SSS: 3
ST Depression (mm): 0 mm
Stress Nuclear Isotope Dose: 30.5 mCi
TID: 1.11

## 2021-01-18 MED ORDER — TECHNETIUM TC 99M TETROFOSMIN IV KIT
10.1000 | PACK | Freq: Once | INTRAVENOUS | Status: AC | PRN
  Administered 2021-01-18: 10.1 via INTRAVENOUS
  Filled 2021-01-18: qty 11

## 2021-01-18 MED ORDER — TECHNETIUM TC 99M TETROFOSMIN IV KIT
30.5000 | PACK | Freq: Once | INTRAVENOUS | Status: AC | PRN
  Administered 2021-01-18: 30.5 via INTRAVENOUS
  Filled 2021-01-18: qty 31

## 2021-01-29 ENCOUNTER — Encounter (HOSPITAL_COMMUNITY): Payer: Self-pay

## 2021-02-08 ENCOUNTER — Other Ambulatory Visit: Payer: Self-pay

## 2021-02-08 ENCOUNTER — Ambulatory Visit (HOSPITAL_COMMUNITY)
Admission: RE | Admit: 2021-02-08 | Discharge: 2021-02-08 | Disposition: A | Discharge status: Home / Self Care | Payer: No Typology Code available for payment source | Source: Ambulatory Visit | Attending: Internal Medicine | Admitting: Internal Medicine

## 2021-02-08 DIAGNOSIS — E7800 Pure hypercholesterolemia, unspecified: Secondary | ICD-10-CM | POA: Diagnosis present

## 2021-02-08 LAB — LIPID PANEL
Cholesterol: 217 mg/dL — ABNORMAL HIGH (ref 0–200)
HDL: 52 mg/dL (ref >40)
LDL Cholesterol: 108 mg/dL — ABNORMAL HIGH (ref 0–99)
Total CHOL/HDL Ratio: 4.2 RATIO
Triglycerides: 286 mg/dL — ABNORMAL HIGH (ref <150)
VLDL: 57 mg/dL — ABNORMAL HIGH (ref 0–40)

## 2021-02-08 LAB — HEPATIC FUNCTION PANEL
ALT: 30 U/L (ref 0–44)
AST: 23 U/L (ref 15–41)
Albumin: 4 g/dL (ref 3.5–5.0)
Alkaline Phosphatase: 49 U/L (ref 38–126)
Bilirubin, Direct: 0.2 mg/dL (ref 0.0–0.2)
Indirect Bilirubin: 1.2 mg/dL — ABNORMAL HIGH (ref 0.3–0.9)
Total Bilirubin: 1.4 mg/dL — ABNORMAL HIGH (ref 0.3–1.2)
Total Protein: 7.2 g/dL (ref 6.5–8.1)

## 2021-02-09 ENCOUNTER — Other Ambulatory Visit: Payer: Self-pay | Admitting: Family Medicine

## 2021-02-09 ENCOUNTER — Other Ambulatory Visit (HOSPITAL_COMMUNITY): Payer: Self-pay | Admitting: Family Medicine

## 2021-02-09 ENCOUNTER — Other Ambulatory Visit (HOSPITAL_COMMUNITY): Payer: Self-pay

## 2021-02-09 MED ORDER — FISH OIL 1000 MG PO CAPS
1000.0000 mg | ORAL_CAPSULE | Freq: Every day | ORAL | 3 refills | Status: DC
  Filled 2021-02-09 – 2021-10-28 (×2): qty 90, 90d supply, fill #0

## 2021-02-09 MED ORDER — GLIPIZIDE ER 10 MG PO TB24
10.0000 mg | ORAL_TABLET | Freq: Every day | ORAL | 3 refills | Status: DC
  Filled 2021-02-09: qty 30, 30d supply, fill #0
  Filled 2021-03-18: qty 30, 30d supply, fill #1
  Filled 2021-05-04: qty 60, 60d supply, fill #2

## 2021-02-09 MED ORDER — POTASSIUM CHLORIDE CRYS ER 20 MEQ PO TBCR
20.0000 meq | EXTENDED_RELEASE_TABLET | Freq: Every day | ORAL | 3 refills | Status: DC | PRN
  Filled 2021-02-09: qty 90, 90d supply, fill #0

## 2021-02-09 MED ORDER — MAGNESIUM OXIDE 400 MG PO TABS
400.0000 mg | ORAL_TABLET | Freq: Every day | ORAL | 3 refills | Status: DC
  Filled 2021-02-09: qty 90, 90d supply, fill #0

## 2021-03-18 ENCOUNTER — Other Ambulatory Visit (HOSPITAL_COMMUNITY): Payer: Self-pay

## 2021-03-19 ENCOUNTER — Other Ambulatory Visit (HOSPITAL_COMMUNITY): Payer: Self-pay

## 2021-03-22 ENCOUNTER — Other Ambulatory Visit (HOSPITAL_COMMUNITY): Payer: Self-pay
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
## 2021-04-07 ENCOUNTER — Other Ambulatory Visit: Payer: Self-pay

## 2021-04-07 ENCOUNTER — Encounter (INDEPENDENT_AMBULATORY_CARE_PROVIDER_SITE_OTHER): Payer: Self-pay | Admitting: Ophthalmology

## 2021-04-07 ENCOUNTER — Ambulatory Visit (INDEPENDENT_AMBULATORY_CARE_PROVIDER_SITE_OTHER): Payer: No Typology Code available for payment source | Admitting: Ophthalmology

## 2021-04-07 DIAGNOSIS — H25811 Combined forms of age-related cataract, right eye: Secondary | ICD-10-CM

## 2021-04-07 DIAGNOSIS — H35033 Hypertensive retinopathy, bilateral: Secondary | ICD-10-CM | POA: Diagnosis not present

## 2021-04-07 DIAGNOSIS — H3522 Other non-diabetic proliferative retinopathy, left eye: Secondary | ICD-10-CM

## 2021-04-07 DIAGNOSIS — H16232 Neurotrophic keratoconjunctivitis, left eye: Secondary | ICD-10-CM

## 2021-04-07 DIAGNOSIS — H35411 Lattice degeneration of retina, right eye: Secondary | ICD-10-CM

## 2021-04-07 DIAGNOSIS — H1820 Unspecified corneal edema: Secondary | ICD-10-CM

## 2021-04-07 DIAGNOSIS — Z961 Presence of intraocular lens: Secondary | ICD-10-CM

## 2021-04-07 DIAGNOSIS — E119 Type 2 diabetes mellitus without complications: Secondary | ICD-10-CM

## 2021-04-07 DIAGNOSIS — H3322 Serous retinal detachment, left eye: Secondary | ICD-10-CM | POA: Diagnosis not present

## 2021-04-07 DIAGNOSIS — H33321 Round hole, right eye: Secondary | ICD-10-CM

## 2021-04-07 DIAGNOSIS — I10 Essential (primary) hypertension: Secondary | ICD-10-CM

## 2021-05-04 ENCOUNTER — Other Ambulatory Visit: Payer: Self-pay | Admitting: Family Medicine

## 2021-05-04 ENCOUNTER — Other Ambulatory Visit (HOSPITAL_COMMUNITY): Payer: Self-pay

## 2021-05-05 ENCOUNTER — Telehealth: Payer: Self-pay

## 2021-05-05 NOTE — Telephone Encounter (Signed)
Pt called to ask about refill for Januvia. He is not sure if he is supposed to be on this medication or not. Per chart last refill 09/08/20, #30, 3 refills - so pt should have run out of this in October. No refill requests have been received.  Pt also did not start Actos or insulin, as per last labs 12/10/20: Hemoglobin A1c is 7.7.  Goal is less than 6.5.  Choices are to add insulin versus adding Actos 30 mg a day.  I would add the Actos and recheck lab work in 3 months   Please advise, thanks!

## 2021-05-06 ENCOUNTER — Other Ambulatory Visit: Payer: Self-pay

## 2021-05-06 ENCOUNTER — Other Ambulatory Visit (HOSPITAL_COMMUNITY): Payer: Self-pay

## 2021-05-06 MED ORDER — SITAGLIPTIN PHOSPHATE 100 MG PO TABS
100.0000 mg | ORAL_TABLET | Freq: Every day | ORAL | 3 refills | Status: DC
  Filled 2021-05-06: qty 30, 30d supply, fill #0
  Filled 2021-06-15: qty 30, 30d supply, fill #1
  Filled 2021-07-22: qty 30, 30d supply, fill #2
  Filled 2021-09-13: qty 30, 30d supply, fill #3
  Filled 2021-10-28: qty 30, 30d supply, fill #4
  Filled 2021-11-27: qty 30, 30d supply, fill #5
  Filled 2022-02-10: qty 30, 30d supply, fill #6
  Filled 2022-03-29: qty 30, 30d supply, fill #7

## 2021-05-06 NOTE — Telephone Encounter (Signed)
Spoke with pt and he states he does not want to start Actos at this time. Pt does need refill for Januvia, this has been sent to his pharmacy. Pt also has upcoming appt next week.  Nothing further needed at this time.

## 2021-05-06 NOTE — Telephone Encounter (Signed)
LMTRC

## 2021-05-10 ENCOUNTER — Encounter: Payer: Self-pay | Admitting: Family Medicine

## 2021-05-10 ENCOUNTER — Ambulatory Visit (INDEPENDENT_AMBULATORY_CARE_PROVIDER_SITE_OTHER): Payer: No Typology Code available for payment source | Admitting: Family Medicine

## 2021-05-10 ENCOUNTER — Other Ambulatory Visit (HOSPITAL_COMMUNITY): Payer: Self-pay

## 2021-05-10 ENCOUNTER — Other Ambulatory Visit: Payer: Self-pay

## 2021-05-10 VITALS — BP 138/82 | HR 83 | Temp 97.2°F | Resp 18 | Ht 74.0 in | Wt 224.0 lb

## 2021-05-10 DIAGNOSIS — I2510 Atherosclerotic heart disease of native coronary artery without angina pectoris: Secondary | ICD-10-CM | POA: Diagnosis not present

## 2021-05-10 DIAGNOSIS — E119 Type 2 diabetes mellitus without complications: Secondary | ICD-10-CM

## 2021-05-10 DIAGNOSIS — E7800 Pure hypercholesterolemia, unspecified: Secondary | ICD-10-CM

## 2021-05-10 MED ORDER — EZETIMIBE 10 MG PO TABS
10.0000 mg | ORAL_TABLET | Freq: Every day | ORAL | 3 refills | Status: DC
  Filled 2021-05-10 – 2021-06-03 (×2): qty 90, 90d supply, fill #0
  Filled 2021-09-13: qty 90, 90d supply, fill #1
  Filled 2022-01-27: qty 90, 90d supply, fill #2
  Filled 2022-03-29: qty 90, 90d supply, fill #3
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
## 2021-05-11 LAB — COMPLETE METABOLIC PANEL WITH GFR
AG Ratio: 1.8 (calc) (ref 1.0–2.5)
ALT: 17 U/L (ref 9–46)
AST: 20 U/L (ref 10–35)
Albumin: 4.1 g/dL (ref 3.6–5.1)
Alkaline phosphatase (APISO): 48 U/L (ref 35–144)
BUN: 16 mg/dL (ref 7–25)
CO2: 27 mmol/L (ref 20–32)
Calcium: 9.2 mg/dL (ref 8.6–10.3)
Chloride: 103 mmol/L (ref 98–110)
Creat: 1.2 mg/dL (ref 0.70–1.35)
Globulin: 2.3 g/dL (calc) (ref 1.9–3.7)
Glucose, Bld: 67 mg/dL (ref 65–99)
Potassium: 4.3 mmol/L (ref 3.5–5.3)
Sodium: 137 mmol/L (ref 135–146)
Total Bilirubin: 0.9 mg/dL (ref 0.2–1.2)
Total Protein: 6.4 g/dL (ref 6.1–8.1)
eGFR: 68 mL/min/{1.73_m2} (ref >=60)

## 2021-05-11 LAB — HEMOGLOBIN A1C
Hgb A1c MFr Bld: 6.8 % of total Hgb — ABNORMAL HIGH (ref <5.7)
Mean Plasma Glucose: 148 mg/dL
eAG (mmol/L): 8.2 mmol/L

## 2021-05-11 LAB — LIPID PANEL
Cholesterol: 146 mg/dL (ref <200)
HDL: 49 mg/dL (ref >=40)
LDL Cholesterol (Calc): 77 mg/dL (calc)
Non-HDL Cholesterol (Calc): 97 mg/dL (calc) (ref <130)
Total CHOL/HDL Ratio: 3 (calc) (ref <5.0)
Triglycerides: 122 mg/dL (ref <150)

## 2021-05-11 LAB — MICROALBUMIN, URINE: Microalb, Ur: 0.8 mg/dL

## 2021-05-18 ENCOUNTER — Other Ambulatory Visit (HOSPITAL_COMMUNITY): Payer: Self-pay

## 2021-05-22 ENCOUNTER — Encounter (HOSPITAL_COMMUNITY): Payer: Self-pay

## 2021-05-22 ENCOUNTER — Emergency Department (HOSPITAL_COMMUNITY): Payer: No Typology Code available for payment source

## 2021-05-22 ENCOUNTER — Emergency Department (HOSPITAL_COMMUNITY)
Admission: EM | Admit: 2021-05-22 | Discharge: 2021-05-22 | Disposition: A | Discharge status: Home / Self Care | Payer: No Typology Code available for payment source | Attending: Emergency Medicine | Admitting: Emergency Medicine

## 2021-05-22 DIAGNOSIS — Z7982 Long term (current) use of aspirin: Secondary | ICD-10-CM | POA: Insufficient documentation

## 2021-05-22 DIAGNOSIS — Z7984 Long term (current) use of oral hypoglycemic drugs: Secondary | ICD-10-CM | POA: Diagnosis not present

## 2021-05-22 DIAGNOSIS — I2510 Atherosclerotic heart disease of native coronary artery without angina pectoris: Secondary | ICD-10-CM | POA: Insufficient documentation

## 2021-05-22 DIAGNOSIS — R072 Precordial pain: Secondary | ICD-10-CM | POA: Insufficient documentation

## 2021-05-22 DIAGNOSIS — Z7901 Long term (current) use of anticoagulants: Secondary | ICD-10-CM | POA: Insufficient documentation

## 2021-05-22 DIAGNOSIS — R61 Generalized hyperhidrosis: Secondary | ICD-10-CM | POA: Diagnosis not present

## 2021-05-22 DIAGNOSIS — Z79899 Other long term (current) drug therapy: Secondary | ICD-10-CM | POA: Insufficient documentation

## 2021-05-22 DIAGNOSIS — R0602 Shortness of breath: Secondary | ICD-10-CM | POA: Insufficient documentation

## 2021-05-22 DIAGNOSIS — I10 Essential (primary) hypertension: Secondary | ICD-10-CM | POA: Insufficient documentation

## 2021-05-22 DIAGNOSIS — E119 Type 2 diabetes mellitus without complications: Secondary | ICD-10-CM | POA: Insufficient documentation

## 2021-05-22 LAB — TROPONIN I (HIGH SENSITIVITY)
Troponin I (High Sensitivity): 7 ng/L (ref <18)
Troponin I (High Sensitivity): 6 ng/L (ref <18)

## 2021-05-22 LAB — BASIC METABOLIC PANEL
Anion gap: 10 (ref 5–15)
BUN: 15 mg/dL (ref 8–23)
CO2: 25 mmol/L (ref 22–32)
Calcium: 9.3 mg/dL (ref 8.9–10.3)
Chloride: 105 mmol/L (ref 98–111)
Creatinine, Ser: 1.17 mg/dL (ref 0.61–1.24)
GFR, Estimated: >60 mL/min (ref >60)
Glucose, Bld: 106 mg/dL — ABNORMAL HIGH (ref 70–99)
Potassium: 4.1 mmol/L (ref 3.5–5.1)
Sodium: 140 mmol/L (ref 135–145)

## 2021-05-22 LAB — CBC
HCT: 41.8 % (ref 39.0–52.0)
Hemoglobin: 12.9 g/dL — ABNORMAL LOW (ref 13.0–17.0)
MCH: 23.9 pg — ABNORMAL LOW (ref 26.0–34.0)
MCHC: 30.9 g/dL (ref 30.0–36.0)
MCV: 77.4 fL — ABNORMAL LOW (ref 80.0–100.0)
Platelets: 293 10*3/uL (ref 150–400)
RBC: 5.4 MIL/uL (ref 4.22–5.81)
RDW: 15.9 % — ABNORMAL HIGH (ref 11.5–15.5)
WBC: 5.1 10*3/uL (ref 4.0–10.5)
nRBC: 0 % (ref 0.0–0.2)

## 2021-05-22 LAB — HEPATIC FUNCTION PANEL
ALT: 32 U/L (ref 0–44)
AST: 24 U/L (ref 15–41)
Albumin: 3.9 g/dL (ref 3.5–5.0)
Alkaline Phosphatase: 48 U/L (ref 38–126)
Bilirubin, Direct: <0.1 mg/dL (ref 0.0–0.2)
Total Bilirubin: 0.8 mg/dL (ref 0.3–1.2)
Total Protein: 6.9 g/dL (ref 6.5–8.1)

## 2021-05-22 LAB — LIPASE, BLOOD: Lipase: 154 U/L — ABNORMAL HIGH (ref 11–51)

## 2021-05-22 MED ORDER — ASPIRIN 81 MG PO CHEW
243.0000 mg | CHEWABLE_TABLET | Freq: Once | ORAL | Status: AC
  Administered 2021-05-22: 243 mg via ORAL
  Filled 2021-05-22: qty 3

## 2021-05-22 MED ORDER — NITROGLYCERIN 0.4 MG SL SUBL
0.4000 mg | SUBLINGUAL_TABLET | SUBLINGUAL | Status: AC | PRN
  Administered 2021-05-22 (×3): 0.4 mg via SUBLINGUAL
  Filled 2021-05-22: qty 1

## 2021-05-22 NOTE — ED Notes (Signed)
Pt discharged and ambulated out of the ED without difficulty. 

## 2021-05-22 NOTE — ED Provider Triage Note (Signed)
Emergency Medicine Provider Triage Evaluation Note  Derrick Todd , a 61 y.o. male  was evaluated in triage.  Pt complains of chest pain, described as a pressure. Onset at rest while at work tonight; seated. Had associated diaphoresis. Pain not aggravated with exertion. No SOB, nausea, vomiting, fevers. Took 81mg  ASA PTA, but no NTG. States pain currently at 2-3/10 from 4/10. Feels similar to prior MI.  Hx of CAD s/p PCI x 3, HLD, HTN, DM Cardiologist - Dr. 6/10  Review of Systems  Positive: As above Negative: As above  Physical Exam  BP (!) 146/89 (BP Location: Right Arm)    Pulse 82    Temp 98.3 F (36.8 C) (Oral)    Resp 20    SpO2 99%  Gen:   Awake, no distress   Resp:  Normal effort  MSK:   Moves extremities without difficulty  Other:  Lungs CTAB. Heart RRR. Ambulatory with steady gait.  Medical Decision Making  Medically screening exam initiated at 2:39 AM.  Appropriate orders placed.  Abhi Moccia was informed that the remainder of the evaluation will be completed by another provider, this initial triage assessment does not replace that evaluation, and the importance of remaining in the ED until their evaluation is complete.  Unspecified chest pain - hx of ACS. EKG not c/w STEMI criteria. Labs initiated with CXR, EKG. Will supplement ASA. NTG held given systolic of 146, improving pain.   [name], PA-C 05/22/21 [phone]
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
## 2021-05-22 NOTE — ED Triage Notes (Signed)
Pt states that he was working and began to have central CP, diaphoretic, hx of MI in the past. Denies SOB or nausea. PTA to took 81mg  ASA

## 2021-05-22 NOTE — Discharge Instructions (Signed)

## 2021-05-27 ENCOUNTER — Other Ambulatory Visit (HOSPITAL_COMMUNITY): Payer: Self-pay

## 2021-05-27 ENCOUNTER — Other Ambulatory Visit: Payer: Self-pay

## 2021-05-27 ENCOUNTER — Other Ambulatory Visit: Payer: Self-pay | Admitting: Family Medicine

## 2021-05-28 ENCOUNTER — Other Ambulatory Visit (HOSPITAL_COMMUNITY): Payer: Self-pay

## 2021-05-31 ENCOUNTER — Other Ambulatory Visit (HOSPITAL_COMMUNITY): Payer: Self-pay

## 2021-05-31 ENCOUNTER — Other Ambulatory Visit: Payer: Self-pay

## 2021-06-01 ENCOUNTER — Other Ambulatory Visit (HOSPITAL_COMMUNITY): Payer: Self-pay

## 2021-06-03 ENCOUNTER — Other Ambulatory Visit (HOSPITAL_COMMUNITY): Payer: Self-pay

## 2021-06-03 ENCOUNTER — Other Ambulatory Visit: Payer: Self-pay

## 2021-06-03 MED ORDER — PREDNISOLONE ACETATE 1 % OP SUSP
1.0000 [drp] | Freq: Four times a day (QID) | OPHTHALMIC | 5 refills | Status: DC
  Filled 2021-06-03: qty 10, 50d supply, fill #0
  Filled 2021-10-28: qty 10, 50d supply, fill #1
  Filled 2022-02-10: qty 10, 50d supply, fill #2

## 2021-06-07 ENCOUNTER — Other Ambulatory Visit (HOSPITAL_COMMUNITY): Payer: Self-pay

## 2021-06-15 ENCOUNTER — Other Ambulatory Visit (HOSPITAL_COMMUNITY): Payer: Self-pay | Admitting: Cardiology

## 2021-06-15 ENCOUNTER — Other Ambulatory Visit (HOSPITAL_COMMUNITY): Payer: Self-pay | Admitting: Family Medicine

## 2021-06-15 ENCOUNTER — Other Ambulatory Visit (HOSPITAL_COMMUNITY): Payer: Self-pay

## 2021-06-15 MED ORDER — GLIPIZIDE ER 10 MG PO TB24
10.0000 mg | ORAL_TABLET | Freq: Every day | ORAL | 3 refills | Status: DC
  Filled 2021-06-15: qty 30, 30d supply, fill #0

## 2021-06-16 ENCOUNTER — Other Ambulatory Visit (HOSPITAL_COMMUNITY): Payer: Self-pay

## 2021-06-16 ENCOUNTER — Telehealth: Payer: Self-pay | Admitting: Family Medicine

## 2021-06-16 ENCOUNTER — Other Ambulatory Visit: Payer: Self-pay

## 2021-06-16 ENCOUNTER — Other Ambulatory Visit (HOSPITAL_COMMUNITY): Payer: Self-pay | Admitting: Cardiology

## 2021-06-16 MED ORDER — METOPROLOL SUCCINATE ER 25 MG PO TB24
75.0000 mg | ORAL_TABLET | Freq: Every day | ORAL | 3 refills | Status: DC
  Filled 2021-06-16: qty 270, 90d supply, fill #0
  Filled 2021-10-28: qty 270, 90d supply, fill #1
  Filled 2022-01-27: qty 270, 90d supply, fill #2
  Filled 2022-03-29: qty 270, 90d supply, fill #3

## 2021-06-16 MED ORDER — GLIPIZIDE ER 10 MG PO TB24
10.0000 mg | ORAL_TABLET | Freq: Every day | ORAL | 3 refills | Status: DC
  Filled 2021-06-16 – 2021-07-22 (×2): qty 90, 90d supply, fill #0
  Filled 2021-10-28: qty 90, 90d supply, fill #1
  Filled 2022-01-27: qty 90, 90d supply, fill #2
  Filled 2022-03-29: qty 90, 90d supply, fill #3

## 2021-06-16 NOTE — Telephone Encounter (Signed)
Spoke with patient and advised we have not received any refill requests from [facility] for these medications.  ? ?Patient advised refills have been sent to pharmacy.  ?

## 2021-06-16 NOTE — Telephone Encounter (Signed)
Received call from patient to follow up on refill requests for  ? ?Rx #: [id number]  ?glipiZIDE (GLUCOTROL XL) 10 MG 24 hr tablet [[id number]]  ? ?Rx #: [id number]  ?metoprolol succinate (TOPROL-XL) 25 MG 24 hr tablet [[id number]]  ? ?Patient states pharmacy called him to notify of provider's denial; patient unsure of why meds denied. ? ?Requesting call back.  ? ?Pharmacy confirmed as ? ?[facility]  ?[address], [place] [address]  ?Phone:  [phone]  Fax:  [phone]  ?DEA #:  [id number] ? ?Please advise at [phone].  ?

## 2021-06-21 ENCOUNTER — Other Ambulatory Visit (HOSPITAL_COMMUNITY): Payer: Self-pay

## 2021-07-22 ENCOUNTER — Other Ambulatory Visit (HOSPITAL_COMMUNITY): Payer: Self-pay

## 2021-07-22 MED ORDER — PANTOPRAZOLE SODIUM 40 MG PO TBEC
40.0000 mg | DELAYED_RELEASE_TABLET | Freq: Every day | ORAL | 3 refills | Status: DC
  Filled 2021-07-22: qty 90, 90d supply, fill #0
  Filled 2021-10-28: qty 90, 90d supply, fill #1
  Filled 2022-01-27: qty 90, 90d supply, fill #2
  Filled 2022-03-29 – 2022-06-29 (×2): qty 90, 90d supply, fill #3

## 2021-07-23 ENCOUNTER — Other Ambulatory Visit (HOSPITAL_COMMUNITY): Payer: Self-pay

## 2021-07-23 ENCOUNTER — Other Ambulatory Visit: Payer: Self-pay | Admitting: Family Medicine

## 2021-07-23 MED ORDER — METFORMIN HCL 1000 MG PO TABS
1000.0000 mg | ORAL_TABLET | Freq: Two times a day (BID) | ORAL | 1 refills | Status: DC
Start: 2021-07-23 — End: 2022-01-27
  Filled 2021-07-23: qty 180, 90d supply, fill #0
  Filled 2021-10-28: qty 180, 90d supply, fill #1

## 2021-09-13 ENCOUNTER — Other Ambulatory Visit (HOSPITAL_COMMUNITY): Payer: Self-pay

## 2021-09-16 ENCOUNTER — Other Ambulatory Visit (HOSPITAL_COMMUNITY): Payer: Self-pay

## 2021-09-17 ENCOUNTER — Other Ambulatory Visit (HOSPITAL_COMMUNITY): Payer: Self-pay

## 2021-10-28 ENCOUNTER — Other Ambulatory Visit (HOSPITAL_COMMUNITY): Payer: Self-pay

## 2021-10-29 ENCOUNTER — Other Ambulatory Visit (HOSPITAL_COMMUNITY): Payer: Self-pay

## 2021-11-29 ENCOUNTER — Other Ambulatory Visit (HOSPITAL_COMMUNITY): Payer: Self-pay
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
## 2021-12-09 ENCOUNTER — Encounter (INDEPENDENT_AMBULATORY_CARE_PROVIDER_SITE_OTHER): Payer: Self-pay | Admitting: Ophthalmology

## 2021-12-09 ENCOUNTER — Ambulatory Visit (INDEPENDENT_AMBULATORY_CARE_PROVIDER_SITE_OTHER): Payer: No Typology Code available for payment source | Admitting: Ophthalmology

## 2021-12-09 DIAGNOSIS — H16232 Neurotrophic keratoconjunctivitis, left eye: Secondary | ICD-10-CM | POA: Diagnosis not present

## 2021-12-09 DIAGNOSIS — Z961 Presence of intraocular lens: Secondary | ICD-10-CM

## 2021-12-09 DIAGNOSIS — H35033 Hypertensive retinopathy, bilateral: Secondary | ICD-10-CM | POA: Diagnosis not present

## 2021-12-09 DIAGNOSIS — H3522 Other non-diabetic proliferative retinopathy, left eye: Secondary | ICD-10-CM

## 2021-12-09 DIAGNOSIS — H25811 Combined forms of age-related cataract, right eye: Secondary | ICD-10-CM

## 2021-12-09 DIAGNOSIS — I10 Essential (primary) hypertension: Secondary | ICD-10-CM

## 2021-12-09 DIAGNOSIS — H33321 Round hole, right eye: Secondary | ICD-10-CM

## 2021-12-09 DIAGNOSIS — H3322 Serous retinal detachment, left eye: Secondary | ICD-10-CM | POA: Diagnosis not present

## 2021-12-09 DIAGNOSIS — H35411 Lattice degeneration of retina, right eye: Secondary | ICD-10-CM

## 2021-12-09 DIAGNOSIS — H1820 Unspecified corneal edema: Secondary | ICD-10-CM

## 2021-12-09 DIAGNOSIS — E119 Type 2 diabetes mellitus without complications: Secondary | ICD-10-CM

## 2021-12-09 LAB — HM DIABETES EYE EXAM

## 2021-12-13 ENCOUNTER — Ambulatory Visit (INDEPENDENT_AMBULATORY_CARE_PROVIDER_SITE_OTHER): Payer: No Typology Code available for payment source | Admitting: Family Medicine

## 2021-12-13 VITALS — BP 124/62 | HR 103 | Temp 98.7°F | Ht 74.0 in | Wt 217.2 lb

## 2021-12-13 DIAGNOSIS — E7800 Pure hypercholesterolemia, unspecified: Secondary | ICD-10-CM

## 2021-12-13 DIAGNOSIS — E119 Type 2 diabetes mellitus without complications: Secondary | ICD-10-CM

## 2021-12-13 DIAGNOSIS — Z125 Encounter for screening for malignant neoplasm of prostate: Secondary | ICD-10-CM

## 2021-12-13 DIAGNOSIS — Z0000 Encounter for general adult medical examination without abnormal findings: Secondary | ICD-10-CM | POA: Diagnosis not present

## 2021-12-13 DIAGNOSIS — I2510 Atherosclerotic heart disease of native coronary artery without angina pectoris: Secondary | ICD-10-CM | POA: Diagnosis not present

## 2021-12-13 DIAGNOSIS — Z23 Encounter for immunization: Secondary | ICD-10-CM
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
## 2021-12-13 NOTE — Addendum Note (Signed)
Addended by: Venia Carbon K on: 12/13/2021 09:07 AM   Modules accepted: Orders

## 2021-12-14 ENCOUNTER — Other Ambulatory Visit: Payer: No Typology Code available for payment source

## 2021-12-15 LAB — CBC WITH DIFFERENTIAL/PLATELET
Absolute Monocytes: 915 cells/uL (ref 200–950)
Basophils Absolute: 70 cells/uL (ref 0–200)
Basophils Relative: 1.4 %
Eosinophils Absolute: 360 cells/uL (ref 15–500)
Eosinophils Relative: 7.2 %
HCT: 39.9 % (ref 38.5–50.0)
Hemoglobin: 12.9 g/dL — ABNORMAL LOW (ref 13.2–17.1)
Lymphs Abs: 915 cells/uL (ref 850–3900)
MCH: 24 pg — ABNORMAL LOW (ref 27.0–33.0)
MCHC: 32.3 g/dL (ref 32.0–36.0)
MCV: 74.3 fL — ABNORMAL LOW (ref 80.0–100.0)
MPV: 9.9 fL (ref 7.5–12.5)
Monocytes Relative: 18.3 %
Neutro Abs: 2740 cells/uL (ref 1500–7800)
Neutrophils Relative %: 54.8 %
Platelets: 262 10*3/uL (ref 140–400)
RBC: 5.37 10*6/uL (ref 4.20–5.80)
RDW: 15.6 % — ABNORMAL HIGH (ref 11.0–15.0)
Total Lymphocyte: 18.3 %
WBC: 5 10*3/uL (ref 3.8–10.8)

## 2021-12-15 LAB — COMPLETE METABOLIC PANEL WITH GFR
AG Ratio: 1.7 (calc) (ref 1.0–2.5)
ALT: 16 U/L (ref 9–46)
AST: 15 U/L (ref 10–35)
Albumin: 4.3 g/dL (ref 3.6–5.1)
Alkaline phosphatase (APISO): 52 U/L (ref 35–144)
BUN: 17 mg/dL (ref 7–25)
CO2: 24 mmol/L (ref 20–32)
Calcium: 9.5 mg/dL (ref 8.6–10.3)
Chloride: 105 mmol/L (ref 98–110)
Creat: 1.06 mg/dL (ref 0.70–1.35)
Globulin: 2.5 g/dL (calc) (ref 1.9–3.7)
Glucose, Bld: 88 mg/dL (ref 65–99)
Potassium: 4.2 mmol/L (ref 3.5–5.3)
Sodium: 139 mmol/L (ref 135–146)
Total Bilirubin: 0.8 mg/dL (ref 0.2–1.2)
Total Protein: 6.8 g/dL (ref 6.1–8.1)
eGFR: 79 mL/min/{1.73_m2} (ref >=60)

## 2021-12-15 LAB — HEMOGLOBIN A1C
Hgb A1c MFr Bld: 6.7 % of total Hgb — ABNORMAL HIGH (ref <5.7)
Mean Plasma Glucose: 146 mg/dL
eAG (mmol/L): 8.1 mmol/L

## 2021-12-15 LAB — MICROALBUMIN, URINE: Microalb, Ur: 0.9 mg/dL

## 2021-12-15 LAB — LIPID PANEL
Cholesterol: 137 mg/dL (ref <200)
HDL: 47 mg/dL (ref >=40)
LDL Cholesterol (Calc): 72 mg/dL (calc)
Non-HDL Cholesterol (Calc): 90 mg/dL (calc) (ref <130)
Total CHOL/HDL Ratio: 2.9 (calc) (ref <5.0)
Triglycerides: 95 mg/dL (ref <150)

## 2021-12-15 LAB — PSA: PSA: 0.22 ng/mL (ref <=4.00)

## 2021-12-21 ENCOUNTER — Other Ambulatory Visit (HOSPITAL_COMMUNITY): Payer: Self-pay

## 2021-12-21 ENCOUNTER — Encounter: Payer: Self-pay | Admitting: Family Medicine

## 2021-12-21 ENCOUNTER — Other Ambulatory Visit: Payer: Self-pay | Admitting: Family Medicine

## 2021-12-21 MED ORDER — MOLNUPIRAVIR EUA 200MG CAPSULE
4.0000 | ORAL_CAPSULE | Freq: Two times a day (BID) | ORAL | 0 refills | Status: AC
  Filled 2021-12-21: qty 40, 5d supply, fill #0

## 2021-12-28 ENCOUNTER — Encounter: Payer: Self-pay | Admitting: Family Medicine

## 2022-01-27 ENCOUNTER — Other Ambulatory Visit (HOSPITAL_COMMUNITY): Payer: Self-pay | Admitting: Cardiology

## 2022-01-27 ENCOUNTER — Other Ambulatory Visit: Payer: Self-pay | Admitting: Family Medicine

## 2022-01-27 ENCOUNTER — Other Ambulatory Visit (HOSPITAL_COMMUNITY): Payer: Self-pay

## 2022-01-27 MED ORDER — ROSUVASTATIN CALCIUM 40 MG PO TABS
40.0000 mg | ORAL_TABLET | Freq: Every day | ORAL | 0 refills | Status: DC
  Filled 2022-01-27: qty 90, 90d supply, fill #0

## 2022-01-27 MED ORDER — ASPIRIN 81 MG PO TBEC
81.0000 mg | DELAYED_RELEASE_TABLET | Freq: Every day | ORAL | 0 refills | Status: DC
  Filled 2022-01-27: qty 90, 90d supply, fill #0

## 2022-01-27 MED ORDER — XARELTO 2.5 MG PO TABS
2.5000 mg | ORAL_TABLET | Freq: Two times a day (BID) | ORAL | 0 refills | Status: DC
  Filled 2022-01-27: qty 180, 90d supply, fill #0

## 2022-01-27 MED ORDER — EMPAGLIFLOZIN 25 MG PO TABS
25.0000 mg | ORAL_TABLET | Freq: Every day | ORAL | 0 refills | Status: DC
Start: 2022-01-27 — End: 2022-10-15
  Filled 2022-01-27: qty 90, 90d supply, fill #0

## 2022-01-27 MED ORDER — LISINOPRIL 5 MG PO TABS
5.0000 mg | ORAL_TABLET | Freq: Every day | ORAL | 0 refills | Status: DC
  Filled 2022-01-27: qty 90, 90d supply, fill #0

## 2022-01-27 MED ORDER — METFORMIN HCL 1000 MG PO TABS
1000.0000 mg | ORAL_TABLET | Freq: Two times a day (BID) | ORAL | 1 refills | Status: DC
  Filled 2022-01-27: qty 180, 90d supply, fill #0
  Filled 2022-06-29: qty 180, 90d supply, fill #1

## 2022-02-11 ENCOUNTER — Other Ambulatory Visit (HOSPITAL_COMMUNITY): Payer: Self-pay

## 2022-03-29 ENCOUNTER — Other Ambulatory Visit (HOSPITAL_COMMUNITY): Payer: Self-pay | Admitting: Cardiology

## 2022-03-29 ENCOUNTER — Other Ambulatory Visit: Payer: Self-pay

## 2022-03-29 ENCOUNTER — Other Ambulatory Visit: Payer: Self-pay | Admitting: Family Medicine

## 2022-03-29 ENCOUNTER — Other Ambulatory Visit (HOSPITAL_COMMUNITY): Payer: Self-pay

## 2022-03-29 MED ORDER — MAGNESIUM OXIDE 400 MG PO TABS
400.0000 mg | ORAL_TABLET | Freq: Every day | ORAL | 3 refills | Status: DC
  Filled 2022-03-29: qty 90, 90d supply, fill #0

## 2022-03-29 MED ORDER — FISH OIL 1000 MG PO CAPS
1000.0000 mg | ORAL_CAPSULE | Freq: Every day | ORAL | 3 refills | Status: DC
  Filled 2022-03-29: qty 90, 90d supply, fill #0

## 2022-03-29 MED ORDER — ROSUVASTATIN CALCIUM 40 MG PO TABS
40.0000 mg | ORAL_TABLET | Freq: Every day | ORAL | 0 refills | Status: DC
  Filled 2022-03-29 – 2022-06-29 (×2): qty 90, 90d supply, fill #0

## 2022-03-30 ENCOUNTER — Other Ambulatory Visit (HOSPITAL_COMMUNITY): Payer: Self-pay

## 2022-06-29 ENCOUNTER — Other Ambulatory Visit (HOSPITAL_COMMUNITY): Payer: Self-pay

## 2022-06-29 ENCOUNTER — Other Ambulatory Visit: Payer: Self-pay | Admitting: Family Medicine

## 2022-06-29 ENCOUNTER — Other Ambulatory Visit: Payer: Self-pay

## 2022-06-29 ENCOUNTER — Other Ambulatory Visit (HOSPITAL_COMMUNITY): Payer: Self-pay | Admitting: Cardiology

## 2022-06-29 MED ORDER — LISINOPRIL 5 MG PO TABS
5.0000 mg | ORAL_TABLET | Freq: Every day | ORAL | 0 refills | Status: DC
  Filled 2022-06-29: qty 90, 90d supply, fill #0

## 2022-06-29 MED ORDER — ASPIRIN 81 MG PO TBEC
81.0000 mg | DELAYED_RELEASE_TABLET | Freq: Every day | ORAL | 0 refills | Status: DC
  Filled 2022-06-29: qty 90, 90d supply, fill #0

## 2022-06-29 MED ORDER — XARELTO 2.5 MG PO TABS
2.5000 mg | ORAL_TABLET | Freq: Two times a day (BID) | ORAL | 0 refills | Status: DC
  Filled 2022-06-29: qty 180, 90d supply, fill #0

## 2022-06-30 ENCOUNTER — Other Ambulatory Visit (HOSPITAL_COMMUNITY): Payer: Self-pay

## 2022-06-30 MED ORDER — GLIPIZIDE ER 10 MG PO TB24
10.0000 mg | ORAL_TABLET | Freq: Every day | ORAL | 1 refills | Status: DC
  Filled 2022-06-30: qty 90, 90d supply, fill #0
  Filled 2022-10-15: qty 90, 90d supply, fill #1

## 2022-06-30 MED ORDER — EZETIMIBE 10 MG PO TABS
10.0000 mg | ORAL_TABLET | Freq: Every day | ORAL | 1 refills | Status: DC
Start: 2022-06-30 — End: 2023-08-02
  Filled 2022-06-30: qty 90, 90d supply, fill #0
  Filled 2022-12-23 – 2023-04-12 (×2): qty 90, 90d supply, fill #1

## 2022-06-30 MED ORDER — SITAGLIPTIN PHOSPHATE 100 MG PO TABS
100.0000 mg | ORAL_TABLET | Freq: Every day | ORAL | 1 refills | Status: DC
  Filled 2022-06-30: qty 30, 30d supply, fill #0
  Filled 2022-10-15: qty 30, 30d supply, fill #1
  Filled 2022-12-23 – 2023-01-06 (×2): qty 30, 30d supply, fill #2
  Filled 2023-04-12: qty 30, 30d supply, fill #3
  Filled 2023-06-19: qty 30, 30d supply, fill #4
  Filled 2023-06-28: qty 30, 30d supply, fill #5

## 2022-06-30 MED ORDER — METOPROLOL SUCCINATE ER 25 MG PO TB24
75.0000 mg | ORAL_TABLET | Freq: Every day | ORAL | 1 refills | Status: DC
  Filled 2022-06-30: qty 270, 90d supply, fill #0
  Filled 2022-10-15: qty 270, 90d supply, fill #1

## 2022-06-30 NOTE — Telephone Encounter (Signed)
Requested Prescriptions  Pending Prescriptions Disp Refills   sitaGLIPtin (JANUVIA) 100 MG tablet 90 tablet 1    Sig: Take 1 tablet (100 mg total) by mouth daily.     Endocrinology:  Diabetes - DPP-4 Inhibitors Failed - 06/29/2022 11:28 AM      Failed - HBA1C is between 0 and 7.9 and within 180 days    Hemoglobin-A1c  Date Value Ref Range Status  09/02/2011 10.7 (H) 5.4 - 7.4 % Final   Hgb A1c MFr Bld  Date Value Ref Range Status  12/14/2021 6.7 (H) <5.7 % of total Hgb Final    Comment:    For someone without known diabetes, a hemoglobin A1c value of 6.5% or greater indicates that they may have  diabetes and this should be confirmed with a follow-up  test. . For someone with known diabetes, a value <7% indicates  that their diabetes is well controlled and a value  greater than or equal to 7% indicates suboptimal  control. A1c targets should be individualized based on  duration of diabetes, age, comorbid conditions, and  other considerations. . Currently, no consensus exists regarding use of hemoglobin A1c for diagnosis of diabetes for children. .          Failed - Valid encounter within last 6 months    Recent Outpatient Visits           1 year ago Controlled type 2 diabetes mellitus without complication, without long-term current use of insulin ([place])   [place] Pickard, Cammie Mcgee, MD   1 year ago Controlled type 2 diabetes mellitus without complication, without long-term current use of insulin ([place])   [place] Pickard, Cammie Mcgee, MD   2 years ago Controlled type 2 diabetes mellitus without complication, without long-term current use of insulin ([place])   [place] [name], MD   3 years ago Controlled type 2 diabetes mellitus without complication, without long-term current use of insulin ([place])   [place] Pickard, Cammie Mcgee, MD   3 years ago General medical exam   [place] [name], MD       Future Appointments             In 5 months Pickard, Cammie Mcgee, MD [facility]            Passed - Cr in normal range and within 360 days    Creat  Date Value Ref Range Status  12/14/2021 1.06 0.70 - 1.35 mg/dL Final          ezetimibe (ZETIA) 10 MG tablet 90 tablet 1    Sig: Take 1 tablet (10 mg total) by mouth daily.     Cardiovascular:  Antilipid - Sterol Transport Inhibitors Failed - 06/29/2022 11:28 AM      Failed - Valid encounter within last 12 months    Recent Outpatient Visits           1 year ago Controlled type 2 diabetes mellitus without complication, without long-term current use of insulin ([place])   [place] Pickard, Cammie Mcgee, MD   1 year ago Controlled type 2 diabetes mellitus without complication, without long-term current use of insulin ([place])   [place] [name], MD   2 years ago Controlled type 2 diabetes mellitus without complication, without long-term current use of insulin ([place])   [place]  [facility] [name], MD   3 years ago Controlled type 2 diabetes mellitus without complication, without long-term current use of insulin ([place])   [place] Pickard, Cammie Mcgee, MD   3 years ago General medical exam   [place] Pickard, Cammie Mcgee, MD       Future Appointments             In 5 months Pickard, Cammie Mcgee, MD [facility]            Failed - Lipid Panel in normal range within the last 12 months    Cholesterol  Date Value Ref Range Status  12/14/2021 137 <200 mg/dL Final   LDL Cholesterol (Calc)  Date Value Ref Range Status  12/14/2021 72 mg/dL (calc) Final    Comment:    Reference range: <100 . Desirable range <100 mg/dL for primary prevention;   <70 mg/dL for patients with CHD or diabetic patients  with > or = 2 CHD risk  factors. [place] Kitchen LDL-C is now calculated using the Martin-Hopkins  calculation, which is a validated novel method providing  better accuracy than the Friedewald equation in the  estimation of LDL-C.  Cresenciano Genre et al. Annamaria Helling. [id number]): 2061-2068  (http://education.[employer].com/faq/FAQ164)    Direct LDL  Date Value Ref Range Status  05/15/2014 155.0 mg/dL Final    Comment:    Optimal:  <100 mg/dLNear or Above Optimal:  100-129 mg/dLBorderline High:  130-159 mg/dLHigh:  160-189 mg/dLVery High:  >190 mg/dL   HDL  Date Value Ref Range Status  12/14/2021 47 > OR = 40 mg/dL Final   Triglycerides  Date Value Ref Range Status  12/14/2021 95 <150 mg/dL Final         Passed - AST in normal range and within 360 days    AST  Date Value Ref Range Status  12/14/2021 15 10 - 35 U/L Final         Passed - ALT in normal range and within 360 days    ALT  Date Value Ref Range Status  12/14/2021 16 9 - 46 U/L Final         Passed - Patient is not pregnant       metoprolol succinate (TOPROL-XL) 25 MG 24 hr tablet 270 tablet 1    Sig: Take 3 tablets (75 mg total) by mouth daily.     Cardiovascular:  Beta Blockers Failed - 06/29/2022 11:28 AM      Failed - Valid encounter within last 6 months    Recent Outpatient Visits           1 year ago Controlled type 2 diabetes mellitus without complication, without long-term current use of insulin ([place])   [place] Pickard, Cammie Mcgee, MD   1 year ago Controlled type 2 diabetes mellitus without complication, without long-term current use of insulin ([place])   [place] [name], MD   2 years ago Controlled type 2 diabetes mellitus without complication, without long-term current use of insulin ([place])   [place] [name], MD   3 years ago Controlled type 2 diabetes mellitus without complication, without long-term current use of insulin ([place])   [place]  Pickard, Cammie Mcgee, MD   3 years ago General medical exam   [place] Pickard, Cammie Mcgee, MD       Future Appointments  In 5 months Pickard, Cammie Mcgee, MD [facility]            Passed - Last BP in normal range    BP Readings from Last 1 Encounters:  12/13/21 124/62         Passed - Last Heart Rate in normal range    Pulse Readings from Last 1 Encounters:  12/13/21 (!) 103          glipiZIDE (GLUCOTROL XL) 10 MG 24 hr tablet 90 tablet 1    Sig: Take 1 tablet (10 mg total) by mouth daily with breakfast. Do Not Crush.     Endocrinology:  Diabetes - Sulfonylureas Failed - 06/29/2022 11:28 AM      Failed - HBA1C is between 0 and 7.9 and within 180 days    Hemoglobin-A1c  Date Value Ref Range Status  09/02/2011 10.7 (H) 5.4 - 7.4 % Final   Hgb A1c MFr Bld  Date Value Ref Range Status  12/14/2021 6.7 (H) <5.7 % of total Hgb Final    Comment:    For someone without known diabetes, a hemoglobin A1c value of 6.5% or greater indicates that they may have  diabetes and this should be confirmed with a follow-up  test. . For someone with known diabetes, a value <7% indicates  that their diabetes is well controlled and a value  greater than or equal to 7% indicates suboptimal  control. A1c targets should be individualized based on  duration of diabetes, age, comorbid conditions, and  other considerations. . Currently, no consensus exists regarding use of hemoglobin A1c for diagnosis of diabetes for children. .          Failed - Valid encounter within last 6 months    Recent Outpatient Visits           1 year ago Controlled type 2 diabetes mellitus without complication, without long-term current use of insulin ([place])   [place] Pickard, Cammie Mcgee, MD   1 year ago Controlled type 2 diabetes mellitus without complication, without long-term current use of insulin ([place])   [place] [name], MD   2 years ago Controlled type 2 diabetes mellitus without complication, without long-term current use of insulin ([place])   [place] [name], MD   3 years ago Controlled type 2 diabetes mellitus without complication, without long-term current use of insulin ([place])   [place] Pickard, Cammie Mcgee, MD   3 years ago General medical exam   [place] Pickard, Cammie Mcgee, MD       Future Appointments             In 5 months Pickard, Cammie Mcgee, MD [facility]            Passed - Cr in normal range and within 360 days    Creat  Date Value Ref Range Status  12/14/2021 1.06 0.70 - 1.35 mg/dL Final

## 2022-07-04 ENCOUNTER — Other Ambulatory Visit (HOSPITAL_COMMUNITY): Payer: Self-pay

## 2022-07-06 ENCOUNTER — Other Ambulatory Visit (HOSPITAL_COMMUNITY): Payer: Self-pay

## 2022-07-07 ENCOUNTER — Other Ambulatory Visit (HOSPITAL_COMMUNITY): Payer: Self-pay

## 2022-07-15 ENCOUNTER — Other Ambulatory Visit (HOSPITAL_COMMUNITY): Payer: Self-pay

## 2022-07-15 DIAGNOSIS — H25811 Combined forms of age-related cataract, right eye: Secondary | ICD-10-CM | POA: Diagnosis not present

## 2022-07-15 MED ORDER — PREDNISOLONE ACETATE 1 % OP SUSP
1.0000 [drp] | Freq: Every day | OPHTHALMIC | 3 refills | Status: DC
  Filled 2022-07-15: qty 5, 50d supply, fill #0
  Filled 2022-10-15: qty 5, 30d supply, fill #0
  Filled 2022-12-23: qty 5, 30d supply, fill #1

## 2022-07-26 ENCOUNTER — Other Ambulatory Visit (HOSPITAL_COMMUNITY): Payer: Self-pay

## 2022-07-28 ENCOUNTER — Other Ambulatory Visit (HOSPITAL_COMMUNITY): Payer: Self-pay

## 2022-10-15 ENCOUNTER — Other Ambulatory Visit: Payer: Self-pay

## 2022-10-15 ENCOUNTER — Other Ambulatory Visit (HOSPITAL_COMMUNITY): Payer: Self-pay | Admitting: Cardiology

## 2022-10-15 ENCOUNTER — Other Ambulatory Visit: Payer: Self-pay | Admitting: Family Medicine

## 2022-10-17 ENCOUNTER — Other Ambulatory Visit (HOSPITAL_COMMUNITY): Payer: Self-pay

## 2022-10-17 ENCOUNTER — Other Ambulatory Visit: Payer: Self-pay

## 2022-10-17 MED ORDER — POTASSIUM CHLORIDE CRYS ER 20 MEQ PO TBCR
20.0000 meq | EXTENDED_RELEASE_TABLET | Freq: Every day | ORAL | 0 refills | Status: DC | PRN
  Filled 2022-10-17 – 2023-04-12 (×3): qty 90, 90d supply, fill #0

## 2022-10-17 MED ORDER — METFORMIN HCL 1000 MG PO TABS
1000.0000 mg | ORAL_TABLET | Freq: Two times a day (BID) | ORAL | 1 refills | Status: DC
  Filled 2022-10-17: qty 180, 90d supply, fill #0
  Filled 2022-12-23 – 2022-12-28 (×3): qty 180, 90d supply, fill #1

## 2022-10-17 NOTE — Telephone Encounter (Signed)
Requested Prescriptions  Pending Prescriptions Disp Refills   potassium chloride SA (KLOR-CON M) 20 MEQ tablet 90 tablet 0    Sig: Take 1 tablet (20 mEq total) by mouth daily as needed.     Endocrinology:  Minerals - Potassium Supplementation Failed - 10/15/2022  3:04 AM      Failed - Valid encounter within last 12 months    Recent Outpatient Visits           1 year ago Controlled type 2 diabetes mellitus without complication, without long-term current use of insulin (HCC)   [facility] Pickard, Priscille Heidelberg, MD   1 year ago Controlled type 2 diabetes mellitus without complication, without long-term current use of insulin (HCC)   [facility] Pickard, Priscille Heidelberg, MD   2 years ago Controlled type 2 diabetes mellitus without complication, without long-term current use of insulin (HCC)   [facility] Pickard, Priscille Heidelberg, MD   3 years ago Controlled type 2 diabetes mellitus without complication, without long-term current use of insulin (HCC)   [facility] Pickard, Priscille Heidelberg, MD   4 years ago General medical exam   [facility] Donita [place]cotton, MD       Future Appointments             In 2 months Pickard, Priscille Heidelberg, MD [facility]            Passed - K in normal range and within 360 days    Potassium  Date Value Ref Range Status  12/14/2021 4.2 3.5 - 5.3 mmol/L Final         Passed - Cr in normal range and within 360 days    Creat  Date Value Ref Range Status  12/14/2021 1.06 0.70 - 1.35 mg/dL Final          metFORMIN (GLUCOPHAGE) 1000 MG tablet 180 tablet 1    Sig: Take 1 tablet (1,000 mg total) by mouth 2 (two) times daily with a meal.     Endocrinology:  Diabetes - Biguanides Failed - 10/15/2022  3:04 AM      Failed - HBA1C is between 0 and 7.9 and within 180 days    Hemoglobin-A1c  Date Value Ref Range Status  09/02/2011 10.7 (H) 5.4 - 7.4 % Final   Hgb A1c  MFr Bld  Date Value Ref Range Status  12/14/2021 6.7 (H) <5.7 % of total Hgb Final    Comment:    For someone without known diabetes, a hemoglobin A1c value of 6.5% or greater indicates that they may have  diabetes and this should be confirmed with a follow-up  test. . For someone with known diabetes, a value <7% indicates  that their diabetes is well controlled and a value  greater than or equal to 7% indicates suboptimal  control. A1c targets should be individualized based on  duration of diabetes, age, comorbid conditions, and  other considerations. . Currently, no consensus exists regarding use of hemoglobin A1c for diagnosis of diabetes for children. .          Failed - B12 Level in normal range and within 720 days    Vitamin B-12  Date Value Ref Range Status  07/31/2017 511 200 - 1,100 pg/mL Final         Failed - Valid encounter within last 6 months    Recent Outpatient Visits  1 year ago Controlled type 2 diabetes mellitus without complication, without long-term current use of insulin (HCC)   [facility] Pickard, Priscille Heidelberg, MD   1 year ago Controlled type 2 diabetes mellitus without complication, without long-term current use of insulin (HCC)   [facility] Donita Westbrook, MD   2 years ago Controlled type 2 diabetes mellitus without complication, without long-term current use of insulin (HCC)   [facility] Donita Pittinger, MD   3 years ago Controlled type 2 diabetes mellitus without complication, without long-term current use of insulin (HCC)   [facility] Pickard, Priscille Heidelberg, MD   4 years ago General medical exam   [facility] Pickard, Priscille Heidelberg, MD       Future Appointments             In 2 months Pickard, Priscille Heidelberg, MD [facility]            Passed - Cr in normal range and within 360 days    Creat  Date Value Ref Range Status   12/14/2021 1.06 0.70 - 1.35 mg/dL Final         Passed - eGFR in normal range and within 360 days    GFR, Est African American  Date Value Ref Range Status  06/18/2019 103 > OR = 60 mL/min/1.19m2 Final   GFR calc Af Amer  Date Value Ref Range Status  09/05/2019 >60 >60 mL/min Final   GFR, Est Non African American  Date Value Ref Range Status  06/18/2019 89 > OR = 60 mL/min/1.18m2 Final   GFR, Estimated  Date Value Ref Range Status  05/22/2021 >60 >60 mL/min Final    Comment:    (NOTE) Calculated using the CKD-EPI Creatinine Equation (2021)    GFR  Date Value Ref Range Status  05/15/2014 89.27 >60.00 mL/min Final   eGFR  Date Value Ref Range Status  12/14/2021 79 > OR = 60 mL/min/1.28m2 Final         Passed - CBC within normal limits and completed in the last 12 months    WBC  Date Value Ref Range Status  12/14/2021 5.0 3.8 - 10.8 Thousand/uL Final   RBC  Date Value Ref Range Status  12/14/2021 5.37 4.20 - 5.80 Million/uL Final   Hemoglobin  Date Value Ref Range Status  12/14/2021 12.9 (L) 13.2 - 17.1 g/dL Final   HCT  Date Value Ref Range Status  12/14/2021 39.9 38.5 - 50.0 % Final   MCHC  Date Value Ref Range Status  12/14/2021 32.3 32.0 - 36.0 g/dL Final   [facility]  Date Value Ref Range Status  12/14/2021 24.0 (L) 27.0 - 33.0 pg Final   MCV  Date Value Ref Range Status  12/14/2021 74.3 (L) 80.0 - 100.0 fL Final   No results found for: "PLTCOUNTKUC", "LABPLAT", "POCPLA" RDW  Date Value Ref Range Status  12/14/2021 15.6 (H) 11.0 - 15.0 % Final

## 2022-10-17 NOTE — Telephone Encounter (Signed)
Requested medication (s) are due for refill today: Yes  Requested medication (s) are on the active medication list: Yes  Last refill:  01/27/22  Future visit scheduled: Yes  Notes to clinic:  Unable to refill per protocol due to failed labs, no updated results.      Requested Prescriptions  Pending Prescriptions Disp Refills   metFORMIN (GLUCOPHAGE) 1000 MG tablet 180 tablet 1    Sig: Take 1 tablet (1,000 mg total) by mouth 2 (two) times daily with a meal.     Endocrinology:  Diabetes - Biguanides Failed - 10/15/2022  3:04 AM      Failed - HBA1C is between 0 and 7.9 and within 180 days    Hemoglobin-A1c  Date Value Ref Range Status  09/02/2011 10.7 (H) 5.4 - 7.4 % Final   Hgb A1c MFr Bld  Date Value Ref Range Status  12/14/2021 6.7 (H) <5.7 % of total Hgb Final    Comment:    For someone without known diabetes, a hemoglobin A1c value of 6.5% or greater indicates that they may have  diabetes and this should be confirmed with a follow-up  test. . For someone with known diabetes, a value <7% indicates  that their diabetes is well controlled and a value  greater than or equal to 7% indicates suboptimal  control. A1c targets should be individualized based on  duration of diabetes, age, comorbid conditions, and  other considerations. . Currently, no consensus exists regarding use of hemoglobin A1c for diagnosis of diabetes for children. .          Failed - B12 Level in normal range and within 720 days    Vitamin B-12  Date Value Ref Range Status  07/31/2017 511 200 - 1,100 pg/mL Final         Failed - Valid encounter within last 6 months    Recent Outpatient Visits           1 year ago Controlled type 2 diabetes mellitus without complication, without long-term current use of insulin (HCC)   [facility] Pickard, Priscille Heidelberg, MD   1 year ago Controlled type 2 diabetes mellitus without complication, without long-term current use of insulin (HCC)   [facility] Pickard, Priscille Heidelberg, MD   2 years ago Controlled type 2 diabetes mellitus without complication, without long-term current use of insulin (HCC)   [facility] Pickard, Priscille Heidelberg, MD   3 years ago Controlled type 2 diabetes mellitus without complication, without long-term current use of insulin (HCC)   [facility] Pickard, Priscille Heidelberg, MD   4 years ago General medical exam   [facility] Pickard, Priscille Heidelberg, MD       Future Appointments             In 2 months Pickard, Priscille Heidelberg, MD [facility]            Passed - Cr in normal range and within 360 days    Creat  Date Value Ref Range Status  12/14/2021 1.06 0.70 - 1.35 mg/dL Final         Passed - eGFR in normal range and within 360 days    GFR, Est African American  Date Value Ref Range Status  06/18/2019 103 > OR = 60 mL/min/1.50m2 Final   GFR calc Af Amer  Date Value Ref Range Status  09/05/2019 >60 >60 mL/min Final  GFR, Est Non African American  Date Value Ref Range Status  06/18/2019 89 > OR = 60 mL/min/1.64m2 Final   GFR, Estimated  Date Value Ref Range Status  05/22/2021 >60 >60 mL/min Final    Comment:    (NOTE) Calculated using the CKD-EPI Creatinine Equation (2021)    GFR  Date Value Ref Range Status  05/15/2014 89.27 >60.00 mL/min Final   eGFR  Date Value Ref Range Status  12/14/2021 79 > OR = 60 mL/min/1.58m2 Final         Passed - CBC within normal limits and completed in the last 12 months    WBC  Date Value Ref Range Status  12/14/2021 5.0 3.8 - 10.8 Thousand/uL Final   RBC  Date Value Ref Range Status  12/14/2021 5.37 4.20 - 5.80 Million/uL Final   Hemoglobin  Date Value Ref Range Status  12/14/2021 12.9 (L) 13.2 - 17.1 g/dL Final   HCT  Date Value Ref Range Status  12/14/2021 39.9 38.5 - 50.0 % Final   MCHC  Date Value Ref Range Status  12/14/2021 32.3 32.0 - 36.0 g/dL Final   [facility]   Date Value Ref Range Status  12/14/2021 24.0 (L) 27.0 - 33.0 pg Final   MCV  Date Value Ref Range Status  12/14/2021 74.3 (L) 80.0 - 100.0 fL Final   No results found for: "PLTCOUNTKUC", "LABPLAT", "POCPLA" RDW  Date Value Ref Range Status  12/14/2021 15.6 (H) 11.0 - 15.0 % Final         Signed Prescriptions Disp Refills   potassium chloride SA (KLOR-CON M) 20 MEQ tablet 90 tablet 0    Sig: Take 1 tablet (20 mEq total) by mouth daily as needed.     Endocrinology:  Minerals - Potassium Supplementation Failed - 10/15/2022  3:04 AM      Failed - Valid encounter within last 12 months    Recent Outpatient Visits           1 year ago Controlled type 2 diabetes mellitus without complication, without long-term current use of insulin (HCC)   [facility] Pickard, Priscille Heidelberg, MD   1 year ago Controlled type 2 diabetes mellitus without complication, without long-term current use of insulin (HCC)   [facility] Pickard, Priscille Heidelberg, MD   2 years ago Controlled type 2 diabetes mellitus without complication, without long-term current use of insulin (HCC)   [facility] Pickard, Priscille Heidelberg, MD   3 years ago Controlled type 2 diabetes mellitus without complication, without long-term current use of insulin (HCC)   [facility] Pickard, Priscille Heidelberg, MD   4 years ago General medical exam   [facility] Pickard, Priscille Heidelberg, MD       Future Appointments             In 2 months Pickard, Priscille Heidelberg, MD [facility]            Passed - K in normal range and within 360 days    Potassium  Date Value Ref Range Status  12/14/2021 4.2 3.5 - 5.3 mmol/L Final         Passed - Cr in normal range and within 360 days    Creat  Date Value Ref Range Status  12/14/2021 1.06 0.70 - 1.35 mg/dL Final

## 2022-10-18 ENCOUNTER — Other Ambulatory Visit: Payer: Self-pay

## 2022-10-18 ENCOUNTER — Other Ambulatory Visit (HOSPITAL_COMMUNITY): Payer: Self-pay

## 2022-10-18 MED ORDER — XARELTO 2.5 MG PO TABS
2.5000 mg | ORAL_TABLET | Freq: Two times a day (BID) | ORAL | 0 refills | Status: DC
  Filled 2022-10-18: qty 90, 45d supply, fill #0

## 2022-10-18 MED ORDER — LISINOPRIL 5 MG PO TABS
5.0000 mg | ORAL_TABLET | Freq: Every day | ORAL | 0 refills | Status: DC
  Filled 2022-10-18: qty 60, 60d supply, fill #0

## 2022-10-18 MED ORDER — ROSUVASTATIN CALCIUM 40 MG PO TABS
40.0000 mg | ORAL_TABLET | Freq: Every day | ORAL | 0 refills | Status: DC
  Filled 2022-10-18: qty 60, 60d supply, fill #0

## 2022-10-18 MED ORDER — EMPAGLIFLOZIN 25 MG PO TABS
25.0000 mg | ORAL_TABLET | Freq: Every day | ORAL | 0 refills | Status: DC
  Filled 2022-10-18: qty 60, 60d supply, fill #0

## 2022-10-19 ENCOUNTER — Other Ambulatory Visit (HOSPITAL_COMMUNITY): Payer: Self-pay

## 2022-10-20 ENCOUNTER — Other Ambulatory Visit (HOSPITAL_COMMUNITY): Payer: Self-pay

## 2022-10-27 ENCOUNTER — Other Ambulatory Visit (HOSPITAL_COMMUNITY): Payer: Self-pay

## 2022-12-12 ENCOUNTER — Encounter (INDEPENDENT_AMBULATORY_CARE_PROVIDER_SITE_OTHER): Payer: 59 | Admitting: Ophthalmology

## 2022-12-12 DIAGNOSIS — H25811 Combined forms of age-related cataract, right eye: Secondary | ICD-10-CM

## 2022-12-12 DIAGNOSIS — H35411 Lattice degeneration of retina, right eye: Secondary | ICD-10-CM

## 2022-12-12 DIAGNOSIS — H3522 Other non-diabetic proliferative retinopathy, left eye: Secondary | ICD-10-CM

## 2022-12-12 DIAGNOSIS — H1820 Unspecified corneal edema: Secondary | ICD-10-CM

## 2022-12-12 DIAGNOSIS — H16232 Neurotrophic keratoconjunctivitis, left eye: Secondary | ICD-10-CM

## 2022-12-12 DIAGNOSIS — H3322 Serous retinal detachment, left eye: Secondary | ICD-10-CM

## 2022-12-12 DIAGNOSIS — Z961 Presence of intraocular lens: Secondary | ICD-10-CM

## 2022-12-12 DIAGNOSIS — I10 Essential (primary) hypertension: Secondary | ICD-10-CM

## 2022-12-12 DIAGNOSIS — E119 Type 2 diabetes mellitus without complications: Secondary | ICD-10-CM

## 2022-12-12 DIAGNOSIS — H35033 Hypertensive retinopathy, bilateral: Secondary | ICD-10-CM

## 2022-12-12 DIAGNOSIS — H33321 Round hole, right eye: Secondary | ICD-10-CM

## 2022-12-16 ENCOUNTER — Ambulatory Visit (INDEPENDENT_AMBULATORY_CARE_PROVIDER_SITE_OTHER): Payer: 59 | Admitting: Family Medicine

## 2022-12-16 VITALS — BP 130/78 | HR 79 | Temp 97.7°F | Ht 74.0 in | Wt 211.4 lb

## 2022-12-16 DIAGNOSIS — E291 Testicular hypofunction: Secondary | ICD-10-CM | POA: Diagnosis not present

## 2022-12-16 DIAGNOSIS — I2510 Atherosclerotic heart disease of native coronary artery without angina pectoris: Secondary | ICD-10-CM

## 2022-12-16 DIAGNOSIS — Z7984 Long term (current) use of oral hypoglycemic drugs: Secondary | ICD-10-CM

## 2022-12-16 DIAGNOSIS — Z125 Encounter for screening for malignant neoplasm of prostate: Secondary | ICD-10-CM | POA: Diagnosis not present

## 2022-12-16 DIAGNOSIS — Z0001 Encounter for general adult medical examination with abnormal findings: Secondary | ICD-10-CM | POA: Diagnosis not present

## 2022-12-16 DIAGNOSIS — E119 Type 2 diabetes mellitus without complications: Secondary | ICD-10-CM | POA: Diagnosis not present

## 2022-12-16 DIAGNOSIS — Z0000 Encounter for general adult medical examination without abnormal findings: Secondary | ICD-10-CM | POA: Diagnosis not present

## 2022-12-16 DIAGNOSIS — E7800 Pure hypercholesterolemia, unspecified: Secondary | ICD-10-CM | POA: Diagnosis not present

## 2022-12-16 DIAGNOSIS — Z1211 Encounter for screening for malignant neoplasm of colon: Secondary | ICD-10-CM
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
## 2022-12-17 LAB — COMPLETE METABOLIC PANEL WITH GFR
AG Ratio: 1.7 (calc) (ref 1.0–2.5)
ALT: 15 U/L (ref 9–46)
AST: 16 U/L (ref 10–35)
Albumin: 4 g/dL (ref 3.6–5.1)
Alkaline phosphatase (APISO): 49 U/L (ref 35–144)
BUN: 9 mg/dL (ref 7–25)
CO2: 23 mmol/L (ref 20–32)
Calcium: 9 mg/dL (ref 8.6–10.3)
Chloride: 105 mmol/L (ref 98–110)
Creat: 0.9 mg/dL (ref 0.70–1.35)
Globulin: 2.4 g/dL (ref 1.9–3.7)
Glucose, Bld: 69 mg/dL (ref 65–99)
Potassium: 3.8 mmol/L (ref 3.5–5.3)
Sodium: 139 mmol/L (ref 135–146)
Total Bilirubin: 0.7 mg/dL (ref 0.2–1.2)
Total Protein: 6.4 g/dL (ref 6.1–8.1)
eGFR: 96 mL/min/{1.73_m2} (ref >=60)

## 2022-12-17 LAB — PROTEIN / CREATININE RATIO, URINE
Creatinine, Urine: 172 mg/dL (ref 20–320)
Protein/Creat Ratio: 105 mg/g{creat} (ref 25–148)
Protein/Creatinine Ratio: 0.105 mg/mg{creat} (ref 0.025–0.148)
Total Protein, Urine: 18 mg/dL (ref 5–25)

## 2022-12-17 LAB — LIPID PANEL
Cholesterol: 130 mg/dL (ref <200)
HDL: 48 mg/dL (ref >=40)
LDL Cholesterol (Calc): 62 mg/dL
Non-HDL Cholesterol (Calc): 82 mg/dL (ref <130)
Total CHOL/HDL Ratio: 2.7 (calc) (ref <5.0)
Triglycerides: 110 mg/dL (ref <150)

## 2022-12-17 LAB — CBC WITH DIFFERENTIAL/PLATELET
Absolute Monocytes: 622 {cells}/uL (ref 200–950)
Basophils Absolute: 29 {cells}/uL (ref 0–200)
Basophils Relative: 0.6 %
Eosinophils Absolute: 299 {cells}/uL (ref 15–500)
Eosinophils Relative: 6.1 %
HCT: 40.1 % (ref 38.5–50.0)
Hemoglobin: 12.5 g/dL — ABNORMAL LOW (ref 13.2–17.1)
Lymphs Abs: 1014 {cells}/uL (ref 850–3900)
MCH: 24.5 pg — ABNORMAL LOW (ref 27.0–33.0)
MCHC: 31.2 g/dL — ABNORMAL LOW (ref 32.0–36.0)
MCV: 78.6 fL — ABNORMAL LOW (ref 80.0–100.0)
MPV: 10.3 fL (ref 7.5–12.5)
Monocytes Relative: 12.7 %
Neutro Abs: 2935 {cells}/uL (ref 1500–7800)
Neutrophils Relative %: 59.9 %
Platelets: 278 10*3/uL (ref 140–400)
RBC: 5.1 10*6/uL (ref 4.20–5.80)
RDW: 15.3 % — ABNORMAL HIGH (ref 11.0–15.0)
Total Lymphocyte: 20.7 %
WBC: 4.9 10*3/uL (ref 3.8–10.8)

## 2022-12-17 LAB — HEMOGLOBIN A1C
Hgb A1c MFr Bld: 6.4 %{Hb} — ABNORMAL HIGH (ref <5.7)
Mean Plasma Glucose: 137 mg/dL
eAG (mmol/L): 7.6 mmol/L

## 2022-12-17 LAB — TESTOSTERONE: Testosterone: 390 ng/dL (ref 250–827)

## 2022-12-17 LAB — PSA: PSA: 0.33 ng/mL (ref <=4.00)

## 2022-12-23 ENCOUNTER — Other Ambulatory Visit (HOSPITAL_COMMUNITY): Payer: Self-pay | Admitting: Cardiology

## 2022-12-23 ENCOUNTER — Other Ambulatory Visit (HOSPITAL_COMMUNITY): Payer: Self-pay

## 2022-12-23 ENCOUNTER — Other Ambulatory Visit: Payer: Self-pay | Admitting: Family Medicine

## 2022-12-23 ENCOUNTER — Other Ambulatory Visit: Payer: Self-pay

## 2022-12-23 MED ORDER — EMPAGLIFLOZIN 25 MG PO TABS
25.0000 mg | ORAL_TABLET | Freq: Every day | ORAL | 0 refills | Status: DC
  Filled 2022-12-23 – 2023-04-12 (×2): qty 90, 90d supply, fill #0

## 2022-12-23 MED ORDER — XARELTO 2.5 MG PO TABS
2.5000 mg | ORAL_TABLET | Freq: Two times a day (BID) | ORAL | 0 refills | Status: DC
  Filled 2022-12-23 – 2023-04-12 (×2): qty 90, 45d supply, fill #0

## 2022-12-23 MED ORDER — LISINOPRIL 5 MG PO TABS
5.0000 mg | ORAL_TABLET | Freq: Every day | ORAL | 0 refills | Status: DC
  Filled 2022-12-23 – 2023-04-12 (×2): qty 90, 90d supply, fill #0

## 2022-12-23 MED ORDER — PANTOPRAZOLE SODIUM 40 MG PO TBEC
40.0000 mg | DELAYED_RELEASE_TABLET | Freq: Every day | ORAL | 0 refills | Status: DC
  Filled 2022-12-23 – 2023-04-12 (×2): qty 90, 90d supply, fill #0

## 2022-12-23 MED ORDER — ROSUVASTATIN CALCIUM 40 MG PO TABS
40.0000 mg | ORAL_TABLET | Freq: Every day | ORAL | 0 refills | Status: DC
  Filled 2022-12-23 – 2023-04-12 (×2): qty 90, 90d supply, fill #0

## 2022-12-26 ENCOUNTER — Other Ambulatory Visit: Payer: Self-pay

## 2022-12-26 ENCOUNTER — Other Ambulatory Visit (HOSPITAL_COMMUNITY): Payer: Self-pay

## 2022-12-26 MED ORDER — METOPROLOL SUCCINATE ER 25 MG PO TB24
75.0000 mg | ORAL_TABLET | Freq: Every day | ORAL | 0 refills | Status: DC
  Filled 2022-12-26 – 2023-04-12 (×3): qty 270, 90d supply, fill #0

## 2022-12-26 MED ORDER — GLIPIZIDE ER 10 MG PO TB24
10.0000 mg | ORAL_TABLET | Freq: Every day | ORAL | 0 refills | Status: DC
  Filled 2022-12-26 – 2023-04-12 (×2): qty 90, 90d supply, fill #0

## 2022-12-26 NOTE — Telephone Encounter (Signed)
Requested Prescriptions  Pending Prescriptions Disp Refills   metoprolol succinate (TOPROL-XL) 25 MG 24 hr tablet 270 tablet 0    Sig: Take 3 tablets (75 mg total) by mouth daily.     Cardiovascular:  Beta Blockers Failed - 12/23/2022  2:04 AM      Failed - Valid encounter within last 6 months    Recent Outpatient Visits           1 year ago Controlled type 2 diabetes mellitus without complication, without long-term current use of insulin (HCC)   [facility] Pickard, Priscille Heidelberg, MD   2 years ago Controlled type 2 diabetes mellitus without complication, without long-term current use of insulin (HCC)   [facility] Pickard, Priscille Heidelberg, MD   2 years ago Controlled type 2 diabetes mellitus without complication, without long-term current use of insulin (HCC)   [facility] Pickard, Priscille Heidelberg, MD   3 years ago Controlled type 2 diabetes mellitus without complication, without long-term current use of insulin (HCC)   [facility] Pickard, Priscille Heidelberg, MD   4 years ago General medical exam   [facility] Pickard, Priscille Heidelberg, MD              Passed - Last BP in normal range    BP Readings from Last 1 Encounters:  12/16/22 130/78         Passed - Last Heart Rate in normal range    Pulse Readings from Last 1 Encounters:  12/16/22 79          glipiZIDE (GLUCOTROL XL) 10 MG 24 hr tablet 90 tablet 0    Sig: Take 1 tablet (10 mg total) by mouth daily with breakfast. Do Not Crush.     Endocrinology:  Diabetes - Sulfonylureas Failed - 12/23/2022  2:04 AM      Failed - Valid encounter within last 6 months    Recent Outpatient Visits           1 year ago Controlled type 2 diabetes mellitus without complication, without long-term current use of insulin (HCC)   [facility] Pickard, Priscille Heidelberg, MD   2 years ago Controlled type 2 diabetes mellitus without complication, without long-term current use of insulin  (HCC)   [facility] Pickard, Priscille Heidelberg, MD   2 years ago Controlled type 2 diabetes mellitus without complication, without long-term current use of insulin (HCC)   [facility] Pickard, Priscille Heidelberg, MD   3 years ago Controlled type 2 diabetes mellitus without complication, without long-term current use of insulin (HCC)   [facility] Pickard, Priscille Heidelberg, MD   4 years ago General medical exam   [facility] Pickard, Priscille Heidelberg, MD              Passed - HBA1C is between 0 and 7.9 and within 180 days    Hemoglobin-A1c  Date Value Ref Range Status  09/02/2011 10.7 (H) 5.4 - 7.4 % Final   Hgb A1c MFr Bld  Date Value Ref Range Status  12/16/2022 6.4 (H) <5.7 % of total Hgb Final    Comment:    For someone without known diabetes, a hemoglobin  A1c value between 5.7% and 6.4% is consistent with prediabetes and should be confirmed with a  follow-up test. . For someone with known diabetes, a value <7% indicates that their diabetes is well controlled. A1c  targets should be individualized based on duration of diabetes, age, comorbid conditions, and other considerations. . This assay result is consistent with an increased risk of diabetes. . Currently, no consensus exists regarding use of hemoglobin A1c for diagnosis of diabetes for children. .          Passed - Cr in normal range and within 360 days    Creat  Date Value Ref Range Status  12/16/2022 0.90 0.70 - 1.35 mg/dL Final   Creatinine, Urine  Date Value Ref Range Status  12/16/2022 172 20 - 320 mg/dL Final

## 2022-12-27 ENCOUNTER — Encounter: Payer: Self-pay | Admitting: Family Medicine

## 2022-12-28 ENCOUNTER — Other Ambulatory Visit: Payer: Self-pay

## 2022-12-29 ENCOUNTER — Other Ambulatory Visit: Payer: Self-pay

## 2022-12-29 ENCOUNTER — Other Ambulatory Visit (HOSPITAL_COMMUNITY): Payer: Self-pay

## 2022-12-29 DIAGNOSIS — E119 Type 2 diabetes mellitus without complications: Secondary | ICD-10-CM

## 2022-12-29 MED ORDER — LANCET DEVICE MISC
1.0000 | Freq: Three times a day (TID) | 0 refills | Status: AC
Start: 2022-12-29 — End: 2023-10-29
  Filled 2022-12-29: qty 1, 30d supply, fill #0

## 2022-12-29 MED ORDER — BLOOD GLUCOSE MONITOR SYSTEM W/DEVICE KIT
1.0000 | PACK | Freq: Three times a day (TID) | 0 refills | Status: DC
  Filled 2022-12-29: qty 1, 30d supply, fill #0

## 2022-12-29 MED ORDER — BLOOD GLUCOSE TEST VI STRP
1.0000 | ORAL_STRIP | Freq: Three times a day (TID) | 1 refills | Status: AC
Start: 2022-12-29 — End: 2023-11-20
  Filled 2022-12-29: qty 100, 34d supply, fill #0
  Filled 2023-10-12: qty 100, 34d supply, fill #1

## 2022-12-29 MED ORDER — FREESTYLE LANCETS MISC
1.0000 | Freq: Three times a day (TID) | 0 refills | Status: AC
Start: 2022-12-29 — End: 2023-02-05
  Filled 2022-12-29: qty 100, 30d supply, fill #0

## 2023-01-04 ENCOUNTER — Other Ambulatory Visit (HOSPITAL_COMMUNITY): Payer: Self-pay

## 2023-01-06 ENCOUNTER — Other Ambulatory Visit (HOSPITAL_COMMUNITY): Payer: Self-pay

## 2023-01-09 ENCOUNTER — Other Ambulatory Visit (HOSPITAL_COMMUNITY): Payer: Self-pay

## 2023-01-10 ENCOUNTER — Other Ambulatory Visit (HOSPITAL_COMMUNITY): Payer: Self-pay

## 2023-02-23 ENCOUNTER — Encounter (HOSPITAL_COMMUNITY): Payer: Self-pay | Admitting: Cardiology

## 2023-02-23 ENCOUNTER — Ambulatory Visit (HOSPITAL_COMMUNITY)
Admission: RE | Admit: 2023-02-23 | Discharge: 2023-02-23 | Disposition: A | Discharge status: Home / Self Care | Payer: 59 | Source: Ambulatory Visit | Attending: Cardiology | Admitting: Cardiology

## 2023-02-23 VITALS — BP 118/70 | HR 80 | Wt 210.2 lb

## 2023-02-23 DIAGNOSIS — Z87891 Personal history of nicotine dependence: Secondary | ICD-10-CM | POA: Diagnosis present

## 2023-02-23 DIAGNOSIS — Z79899 Other long term (current) drug therapy: Secondary | ICD-10-CM | POA: Diagnosis not present

## 2023-02-23 DIAGNOSIS — Z7901 Long term (current) use of anticoagulants: Secondary | ICD-10-CM | POA: Insufficient documentation

## 2023-02-23 DIAGNOSIS — I2510 Atherosclerotic heart disease of native coronary artery without angina pectoris: Secondary | ICD-10-CM | POA: Insufficient documentation

## 2023-02-23 DIAGNOSIS — Z955 Presence of coronary angioplasty implant and graft: Secondary | ICD-10-CM | POA: Diagnosis not present

## 2023-02-23 DIAGNOSIS — E785 Hyperlipidemia, unspecified: Secondary | ICD-10-CM | POA: Diagnosis not present

## 2023-02-23 DIAGNOSIS — I739 Peripheral vascular disease, unspecified: Secondary | ICD-10-CM | POA: Diagnosis not present

## 2023-02-23 DIAGNOSIS — I5032 Chronic diastolic (congestive) heart failure: Secondary | ICD-10-CM | POA: Insufficient documentation

## 2023-02-23 DIAGNOSIS — I252 Old myocardial infarction: Secondary | ICD-10-CM | POA: Diagnosis present

## 2023-02-23 NOTE — Patient Instructions (Signed)
There has been no changes to your medications.    Labs done today, your results will be available in MyChart, we will contact you for abnormal readings.  Your physician has requested that you have an echocardiogram. Echocardiography is a painless test that uses sound waves to create images of your heart. It provides your doctor with information about the size and shape of your heart and how well your heart's chambers and valves are working. This procedure takes approximately one hour. There are no restrictions for this procedure. Please do NOT wear cologne, perfume, aftershave, or lotions (deodorant is allowed). Please arrive 15 minutes prior to your appointment time.  Please note: We ask at that you not bring children with you during ultrasound (echo/ vascular) testing. Due to room size and safety concerns, children are not allowed in the ultrasound rooms during exams. Our front office staff cannot provide observation of children in our lobby area while testing is being conducted. An adult accompanying a patient to their appointment will only be allowed in the ultrasound room at the discretion of the ultrasound technician under special circumstances. We apologize for any inconvenience.  Your physician recommends that you schedule a follow-up appointment in: 6 months ( May 2025) ** PLEASE CALL THE OFFICE IN MARCH 2025 TO ARRANGE YOUR FOLLOW UP APPOINTMENT. **  If you have any questions or concerns before your next appointment please send [place] a message through [place] or call our office at [phone].    TO LEAVE A MESSAGE FOR THE NURSE SELECT OPTION 2, PLEASE LEAVE A MESSAGE INCLUDING: YOUR NAME DATE OF BIRTH CALL BACK NUMBER REASON FOR CALL**this is important as we prioritize the call backs  YOU WILL RECEIVE A CALL BACK THE SAME DAY AS LONG AS YOU CALL BEFORE 4:00 PM  At the [facility], you and your health needs are our priority. As part of our continuing mission to provide  you with exceptional heart care, we have created designated Provider Care Teams. These Care Teams include your primary Cardiologist (physician) and Advanced Practice Providers (APPs- Physician Assistants and Nurse Practitioners) who all work together to provide you with the care you need, when you need it.   You may see any of the following providers on your designated Care Team at your next follow up: Dr Arvilla Meres Dr Marca Ancona Dr. Dorthula Nettles Dr. Clearnce Hasten Amy Filbert Schilder, NP Robbie Lis, [place] [facility] [place], [place] Brynda Peon, NP [place] Lee, NP Karle Plumber, PharmD   Please be sure to bring in all your medications bottles to every appointment.    Thank you for choosing [facility]

## 2023-02-23 NOTE — Progress Notes (Signed)
Date:  02/23/2023   ID:  Derrick Todd, DOB [birth year], MRN [id number]   Provider location: [facility] Type of Visit: Established patient   PCP:  Derrick Deland, MD  Cardiologist:  Derrick Ancona, MD   History of Present Illness: Derrick Todd is a 62 y.o. male who has history of CAD s/p NSTEMI in 5/13 and inferior STEMI in 12/14.  In 5/13, he had an NSTEMI with BMS to totally occluded distal RCA.  In 12/14, he had an inferior STEMI with 95% mid RCA (distal RCA stent patent) at [facility].  He had BMS to [facility].  Echo (12/14) showed EF 50-55%.  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  He quit smoking in 7/14.  Echo (6/15) showed EF 55-60% with basal to mid inferolateral hypokinesis (no significant change from prior).  He was admitted with unstable angina in 12/17, had DES to mid LAD 90% stenosis. Peripheral arterial dopplers in 8/18 showed 75-99% mid left femoral artery stenosis.    Echo in 12/18 with EF 55-60%.   Patient had a spontaneous left retinal detachment which has required multiple procedures to treat.   Cardiolite in 10/22 showed EF 48%, inferior MI with mild peri-infarct ischemia.    Patient returns for followup of CAD. Weight is down 12 lbs.  He has been working on his diet. He continues to work as a Engineer, civil (consulting) in the ER.  No exertional dyspnea working in the ER.  He has had no recent chest pain.  He does note fatigue/shortness of breath with prolonged yardwork like raking. No claudication. No lightheadedness or palpitations.    ECG (personally reviewed): NSR, PAC  Labs (5/13): LDL 122, HDL 32 Labs (6/13): K 3.9, creatinine 1.0 Labs (12/14): K 3.8, creatinine 1.0, LDL 153 Labs (3/15): LDL 88, HDL 43, hgbA1c 9.2 Labs (6/15): K 4, creatinine 1.0 Labs (9/17): LDL 212, hgb 14.2 Labs (9/18): LDL 60, HDL 44, K 3.9, creatinine 4.09, hgb 14.1 Labs (3/19): LDL 76, HDL 42, TGs 186, K 4.4, creatinine 1.2 Labs  (7/19): LDL 76, HDL 55 Labs (11/19): K 4.3, creatinine 1.01, hgb 12.7 Labs (10/21): K 3.9, creatinine 0.88 Labs (11/21): LDL 74, TGs 190 Labs (9/22): LDL 106, TGs 200, K 4.1, creatinine 0.95 Labs (9/24): K 3.8, creatinine 0.9, LDL 62, TGs 110, hgbA1c 6.4   PMH: 1. CAD: NSTEMI 5/13 with LHC showing 70% OM, 70% mLAD, total occlusion distal RCA, EF 55%.  Had 2 x 18 BMS to distal RCA.  Echo (5/13): EF 55-60% with basal inferior hypokinesis.  Inferior STEMI (12/14).  LHC showed 60% mLAD, 60-70% dLCx, 95% mRCA, patent dRCA stent.  Patient had BMS to [facility].  Echo (12/14) with EF 50-55% [facility]).  Echo (6/15) with EF 55-60%, basal to mid inferolateral hypokinesis.  - Unstable angina 12/17 with LHC showing 90% mLAD stenosis treated with DES and 80% distal LCx stenosis (small vessel, medical management).  EF 50-55%.  - Echo (12/18): EF 55-60%.  - Cardiolite (10/22): EF 48%, inferior MI with mild peri-infarct ischemia.  2. Hyperlipidemia: myalgias with atorvastatin. Myalgias with Crestor but more manageable. Feels "foggy" when he takes Crestor daily.  3. Type II diabetes 4. Erectile dysfunction 5. Low testosterone 6. PAD: Peripheral arterial dopplers (8/18) with 75-99% mid left femoral artery stenosis.  - ABIs (12/21) normal  Current Outpatient Medications  Medication Sig Dispense Refill   acetaminophen (TYLENOL) 325 MG tablet Take 650  mg by mouth every 6 (six) hours as needed (pain.).     aspirin EC (ASPIRIN LOW DOSE) 81 MG tablet Take 1 tablet (81 mg total) by mouth daily. Swallow whole. 90 tablet 0   Blood Glucose Monitoring Suppl (BLOOD GLUCOSE MONITOR SYSTEM) w/Device KIT Use as directed to check blood sugar 3 times daily (morning, noon, and bedtime). 1 kit 0   Coenzyme Q10 200 MG capsule Take 200 mg by mouth every evening.     cyclobenzaprine (FLEXERIL) 10 MG tablet Take 1 tablet (10 mg total) by mouth 3 (three) times daily as needed for muscle spasms. 30 tablet 0   empagliflozin (JARDIANCE) 25  MG TABS tablet Take 1 tablet (25 mg total) by mouth daily. NEEDS FOLLOW UP APPOINTMENT FOR MORE REFIILLS 90 tablet 0   ezetimibe (ZETIA) 10 MG tablet Take 1 tablet (10 mg total) by mouth daily. 90 tablet 1   furosemide (LASIX) 20 MG tablet Take 1 tablet (20 mg total) by mouth as needed for fluid or swelling 45 tablet 3   glipiZIDE (GLUCOTROL XL) 10 MG 24 hr tablet Take 1 tablet (10 mg total) by mouth daily with breakfast. Do Not Crush. 90 tablet 0   Glucose Blood (BLOOD GLUCOSE TEST STRIPS) STRP Use as directed to test blood sugar 3 times daily (morning, noon and bedtime). 100 each 1   lisinopril (ZESTRIL) 5 MG tablet Take 1 tablet (5 mg total) by mouth daily. NEEDS FOLLOW UP APPOINTMENT FOR MORE REFILLS 90 tablet 0   magnesium oxide (MAG-OX) 400 MG tablet Take 1 tablet (400 mg total) by mouth daily. 90 tablet 3   metFORMIN (GLUCOPHAGE) 1000 MG tablet Take 1 tablet (1,000 mg total) by mouth 2 (two) times daily with a meal. 180 tablet 1   metoprolol succinate (TOPROL-XL) 25 MG 24 hr tablet Take 3 tablets (75 mg total) by mouth daily. 270 tablet 0   nitroGLYCERIN (NITROSTAT) 0.4 MG SL tablet Place 1 tablet (0.4 mg total) under the tongue every 5 (five) minutes x 3 doses as needed for chest pain. 25 tablet 4   pantoprazole (PROTONIX) 40 MG tablet Take 1 tablet (40 mg total) by mouth daily. 90 tablet 0   potassium chloride SA (KLOR-CON M) 20 MEQ tablet Take 1 tablet (20 mEq total) by mouth daily as needed. 90 tablet 0   prednisoLONE acetate (PRED FORTE) 1 % ophthalmic suspension Place 1 drop into the left eye 3-4 times daily. 10 mL 5   rivaroxaban (XARELTO) 2.5 MG TABS tablet Take 1 tablet (2.5 mg total) by mouth 2 (two) times daily. NEEDS FOLLOW UP APPOINTMENT FOR MORE REFILLS 90 tablet 0   rosuvastatin (CRESTOR) 40 MG tablet Take 1 tablet (40 mg total) by mouth daily. NEEDS FOLLOW UP FOR MORE REFILLS 90 tablet 0   sildenafil (VIAGRA) 50 MG tablet TAKE 1 TABLET BY MOUTH DAILY AS NEEDED FOR ERECTILE  DYSFUNCTION. DO NOT TAKE NITROGLYCERIN WITHIN 24 HOURS OF TAKING VIAGRA 15 tablet 1   sitaGLIPtin (JANUVIA) 100 MG tablet Take 1 tablet (100 mg total) by mouth daily. 90 tablet 1   sodium chloride (MURO 128) 5 % ophthalmic solution Place 1 drop into the left eye in the morning, at noon, and at bedtime.     Testosterone 20.25 MG/ACT (1.62%) GEL APPLY 1 PUMP DAILY AS DIRECTED 75 g 3   Omega-3 Fatty Acids (FISH OIL) 1000 MG CAPS Take 1 capsule (1,000 mg total) by mouth daily. (Patient not taking: Reported on 02/23/2023) 90 capsule 3  No current facility-administered medications for this encounter.    Allergies:   Patient has no active allergies.   Social History:  The patient  reports that he quit smoking about 10 years ago. His smoking use included cigarettes. He started smoking about 25 years ago. He has a 7.5 pack-year smoking history. He has never used smokeless tobacco. He reports current alcohol use of about 6.0 - 7.0 standard drinks of alcohol per week. He reports that he does not use drugs.   Family History:  The patient's family history includes Arthritis in his mother; Depression in his mother; Diabetes in his brother and mother; Heart attack in his maternal grandfather; Heart failure in his maternal grandfather; Hyperlipidemia in his mother; Hypertension in his brother, father, maternal grandfather, and mother; Learning disabilities in his mother; Mental illness in his mother; Stroke in his father and mother.   ROS:  Please see the history of present illness.   All other systems are personally reviewed and negative.   Exam:   BP 118/70   Pulse 80   Wt 95.3 kg (210 lb 3.2 oz)   SpO2 96%   BMI 26.99 kg/m  General: NAD Neck: No JVD, no thyromegaly or thyroid nodule.  Lungs: Clear to auscultation bilaterally with normal respiratory effort. CV: Nondisplaced PMI.  Heart regular S1/S2, no S3/S4, no murmur.  No peripheral edema.  No carotid bruit.  Difficult to palpate pedal pulses.   Abdomen: Soft, nontender, no hepatosplenomegaly, no distention.  Skin: Intact without lesions or rashes.  Neurologic: Alert and oriented x 3.  Psych: Normal affect. Extremities: No clubbing or cyanosis.  HEENT: Normal.   Recent Labs: 12/16/2022: ALT 15; BUN 9; Creat 0.90; Hemoglobin 12.5; Platelets 278; Potassium 3.8; Sodium 139  Personally reviewed   Wt Readings from Last 3 Encounters:  02/23/23 95.3 kg (210 lb 3.2 oz)  12/16/22 95.9 kg (211 lb 6.4 oz)  12/13/21 98.5 kg (217 lb 3.2 oz)      ASSESSMENT AND PLAN:  1. CAD: Most recently had unstable angina in 12/17 with DES to mid LAD, had medically managed 80% distal LCx stenosis (small vessel).   Cardiolite in 10/22 with prior inferior MI, EF 48%. No recent chest pain.  - Continue ASA 81.  - Continue Xarelto 2.5 mg bid (COMPASS regimen) given extensive vascular disease.  - Continue Crestor 40 mg daily, good lipids in 9/24.   - Continue current Toprol XL and lisinopril.  2. Hyperlipidemia:  Tolerating Crestor 40 mg daily and Zetia 10 mg daily.  Most recent LDL was 62.  I will check lipoprotein(a).  If elevated, will send to lipid clinic for Repatha.   3. PAD: 75-99% mid left femoral artery stenosis on peripheral arterial dopplers from 8/18, but ABIs normal in 12/21.  No definite claudication and no pedal ulcerations. Medical management for now.   Recommended follow-up:  6 months   Signed, Derrick Ancona, MD  02/23/2023  [facility] [address] [facility] [place] [address] [phone] (office) [phone] (fax)

## 2023-02-24 LAB — LIPOPROTEIN A (LPA): Lipoprotein (a): 55 nmol/L — ABNORMAL HIGH (ref <75.0)

## 2023-04-12 ENCOUNTER — Other Ambulatory Visit: Payer: Self-pay | Admitting: Family Medicine

## 2023-04-12 ENCOUNTER — Other Ambulatory Visit (HOSPITAL_COMMUNITY): Payer: Self-pay

## 2023-04-12 ENCOUNTER — Ambulatory Visit (HOSPITAL_COMMUNITY)
Admission: RE | Admit: 2023-04-12 | Discharge: 2023-04-12 | Disposition: A | Discharge status: Home / Self Care | Payer: Commercial Managed Care - PPO | Source: Ambulatory Visit | Attending: Cardiology | Admitting: Cardiology

## 2023-04-12 DIAGNOSIS — I252 Old myocardial infarction: Secondary | ICD-10-CM | POA: Diagnosis not present

## 2023-04-12 DIAGNOSIS — I5032 Chronic diastolic (congestive) heart failure: Secondary | ICD-10-CM | POA: Diagnosis not present

## 2023-04-12 DIAGNOSIS — I2510 Atherosclerotic heart disease of native coronary artery without angina pectoris: Secondary | ICD-10-CM | POA: Diagnosis not present

## 2023-04-12 DIAGNOSIS — E119 Type 2 diabetes mellitus without complications: Secondary | ICD-10-CM | POA: Insufficient documentation

## 2023-04-12 DIAGNOSIS — I110 Hypertensive heart disease with heart failure: Secondary | ICD-10-CM | POA: Diagnosis not present

## 2023-04-12 DIAGNOSIS — E785 Hyperlipidemia, unspecified: Secondary | ICD-10-CM

## 2023-04-12 DIAGNOSIS — I351 Nonrheumatic aortic (valve) insufficiency: Secondary | ICD-10-CM | POA: Insufficient documentation

## 2023-04-12 LAB — ECHOCARDIOGRAM COMPLETE
AR max vel: 2.41 cm2
AV Area VTI: 2.78 cm2
AV Area mean vel: 2.41 cm2
AV Mean grad: 8 mm[Hg]
AV Peak grad: 14.4 mm[Hg]
Ao pk vel: 1.9 m/s
Area-P 1/2: 4.19 cm2
S' Lateral: 3.5 cm

## 2023-04-12 MED ORDER — MAGNESIUM OXIDE 400 MG PO TABS
400.0000 mg | ORAL_TABLET | Freq: Every day | ORAL | 3 refills | Status: DC
  Filled 2023-04-12: qty 120, 120d supply, fill #0
  Filled 2023-04-12: qty 90, 90d supply, fill #0
  Filled 2023-08-02: qty 90, 90d supply, fill #1
  Filled 2023-09-27: qty 90, 90d supply, fill #2

## 2023-04-12 MED ORDER — METFORMIN HCL 1000 MG PO TABS
1000.0000 mg | ORAL_TABLET | Freq: Two times a day (BID) | ORAL | 1 refills | Status: AC
  Filled 2023-04-12 – 2023-08-02 (×3): qty 180, 90d supply, fill #0
  Filled 2023-09-27: qty 180, 90d supply, fill #1

## 2023-04-13 ENCOUNTER — Other Ambulatory Visit (HOSPITAL_COMMUNITY): Payer: Self-pay

## 2023-04-13 ENCOUNTER — Other Ambulatory Visit: Payer: Self-pay

## 2023-04-13 MED ORDER — COQ10 100 MG PO CAPS
200.0000 mg | ORAL_CAPSULE | Freq: Every evening | ORAL | 11 refills | Status: DC
  Filled 2023-04-13: qty 60, 30d supply, fill #0
  Filled 2023-06-28 – 2023-08-02 (×2): qty 60, 30d supply, fill #1
  Filled 2023-10-11: qty 60, 30d supply, fill #2

## 2023-04-13 MED ORDER — PREDNISOLONE ACETATE 1 % OP SUSP
1.0000 [drp] | Freq: Four times a day (QID) | OPHTHALMIC | 5 refills | Status: AC
  Filled 2023-04-13: qty 10, 30d supply, fill #0

## 2023-04-20 ENCOUNTER — Encounter (HOSPITAL_COMMUNITY): Payer: Self-pay | Admitting: Cardiology

## 2023-04-24 ENCOUNTER — Other Ambulatory Visit (HOSPITAL_COMMUNITY): Payer: Self-pay

## 2023-04-25 ENCOUNTER — Other Ambulatory Visit (HOSPITAL_COMMUNITY): Payer: Self-pay

## 2023-04-26 ENCOUNTER — Other Ambulatory Visit (HOSPITAL_COMMUNITY): Payer: Self-pay

## 2023-04-27 ENCOUNTER — Other Ambulatory Visit (HOSPITAL_COMMUNITY): Payer: Self-pay

## 2023-05-30 ENCOUNTER — Other Ambulatory Visit (HOSPITAL_COMMUNITY): Payer: Self-pay

## 2023-05-30 MED ORDER — AMOXICILLIN 500 MG PO CAPS
500.0000 mg | ORAL_CAPSULE | Freq: Three times a day (TID) | ORAL | 0 refills | Status: DC
  Filled 2023-05-30: qty 21, 7d supply, fill #0

## 2023-06-19 ENCOUNTER — Other Ambulatory Visit (HOSPITAL_COMMUNITY): Payer: Self-pay

## 2023-06-28 ENCOUNTER — Other Ambulatory Visit: Payer: Self-pay | Admitting: Family Medicine

## 2023-06-28 ENCOUNTER — Other Ambulatory Visit (HOSPITAL_COMMUNITY): Payer: Self-pay | Admitting: Cardiology

## 2023-06-28 ENCOUNTER — Other Ambulatory Visit (HOSPITAL_COMMUNITY): Payer: Self-pay

## 2023-06-28 MED ORDER — XARELTO 2.5 MG PO TABS
2.5000 mg | ORAL_TABLET | Freq: Two times a day (BID) | ORAL | 0 refills | Status: DC
  Filled 2023-06-28 – 2023-08-02 (×2): qty 90, 45d supply, fill #0

## 2023-06-28 MED ORDER — LISINOPRIL 5 MG PO TABS
5.0000 mg | ORAL_TABLET | Freq: Every day | ORAL | 0 refills | Status: DC
  Filled 2023-06-28 – 2023-08-02 (×2): qty 90, 90d supply, fill #0

## 2023-06-28 MED ORDER — ROSUVASTATIN CALCIUM 40 MG PO TABS
40.0000 mg | ORAL_TABLET | Freq: Every day | ORAL | 0 refills | Status: DC
  Filled 2023-06-28 – 2023-08-02 (×2): qty 90, 90d supply, fill #0

## 2023-06-28 MED ORDER — EMPAGLIFLOZIN 25 MG PO TABS
25.0000 mg | ORAL_TABLET | Freq: Every day | ORAL | 0 refills | Status: DC
  Filled 2023-06-28 – 2023-08-02 (×2): qty 90, 90d supply, fill #0

## 2023-06-28 MED ORDER — PANTOPRAZOLE SODIUM 40 MG PO TBEC
40.0000 mg | DELAYED_RELEASE_TABLET | Freq: Every day | ORAL | 0 refills | Status: DC
  Filled 2023-06-28 – 2023-08-02 (×2): qty 90, 90d supply, fill #0

## 2023-06-29 ENCOUNTER — Other Ambulatory Visit: Payer: Self-pay

## 2023-06-29 DIAGNOSIS — Z1211 Encounter for screening for malignant neoplasm of colon: Secondary | ICD-10-CM | POA: Diagnosis not present

## 2023-06-29 NOTE — Telephone Encounter (Signed)
 Requested medications are due for refill today.  yes  Requested medications are on the active medications list.  yes  Last refill. 12/26/2022 90 day supply for each  Future visit scheduled.   no  Notes to clinic.  Pt last seen 12/15/2021.  Unsure if pt I to be seen every 6 months or yearly. Please advise.    Requested Prescriptions  Pending Prescriptions Disp Refills   metoprolol succinate (TOPROL-XL) 25 MG 24 hr tablet 270 tablet 0    Sig: Take 3 tablets (75 mg total) by mouth daily.     Cardiovascular:  Beta Blockers Failed - 06/29/2023  1:59 PM      Failed - Valid encounter within last 6 months    Recent Outpatient Visits           6 months ago Controlled type 2 diabetes mellitus without complication, without long-term current use of insulin (HCC)   [place] [facility] Donita Dier, MD   1 year ago Controlled type 2 diabetes mellitus without complication, without long-term current use of insulin [facility])   [place] [facility] Pickard, Priscille Heidelberg, MD              Passed - Last BP in normal range    BP Readings from Last 1 Encounters:  02/23/23 118/70         Passed - Last Heart Rate in normal range    Pulse Readings from Last 1 Encounters:  02/23/23 80          glipiZIDE (GLUCOTROL XL) 10 MG 24 hr tablet 90 tablet 0    Sig: Take 1 tablet (10 mg total) by mouth daily with breakfast. Do Not Crush.     Endocrinology:  Diabetes - Sulfonylureas Failed - 06/29/2023  1:59 PM      Failed - HBA1C is between 0 and 7.9 and within 180 days    Hemoglobin-A1c  Date Value Ref Range Status  09/02/2011 10.7 (H) 5.4 - 7.4 % Final   Hgb A1c MFr Bld  Date Value Ref Range Status  12/16/2022 6.4 (H) <5.7 % of total Hgb Final    Comment:    For someone without known diabetes, a hemoglobin  A1c value between 5.7% and 6.4% is consistent with prediabetes and should be confirmed with a  follow-up test. . For someone with known diabetes, a  value <7% indicates that their diabetes is well controlled. A1c targets should be individualized based on duration of diabetes, age, comorbid conditions, and other considerations. . This assay result is consistent with an increased risk of diabetes. . Currently, no consensus exists regarding use of hemoglobin A1c for diagnosis of diabetes for children. .          Failed - Valid encounter within last 6 months    Recent Outpatient Visits           6 months ago Controlled type 2 diabetes mellitus without complication, without long-term current use of insulin (HCC)   [place] [facility] Donita Giusto, MD   1 year ago Controlled type 2 diabetes mellitus without complication, without long-term current use of insulin [facility])   [place] [facility] Pickard, Priscille Heidelberg, MD              Passed - Cr in normal range and within 360 days    Creat  Date Value Ref Range Status  12/16/2022 0.90 0.70 - 1.35 mg/dL Final  Creatinine, Urine  Date Value Ref Range Status  12/16/2022 172 20 - 320 mg/dL Final

## 2023-07-03 LAB — COLOGUARD: COLOGUARD: NEGATIVE

## 2023-07-04 ENCOUNTER — Other Ambulatory Visit: Payer: Self-pay

## 2023-07-08 ENCOUNTER — Other Ambulatory Visit (HOSPITAL_COMMUNITY): Payer: Self-pay

## 2023-08-02 ENCOUNTER — Other Ambulatory Visit: Payer: Self-pay | Admitting: Family Medicine

## 2023-08-02 ENCOUNTER — Other Ambulatory Visit (HOSPITAL_COMMUNITY): Payer: Self-pay

## 2023-08-05 MED ORDER — SITAGLIPTIN PHOSPHATE 100 MG PO TABS
100.0000 mg | ORAL_TABLET | Freq: Every day | ORAL | 0 refills | Status: DC
  Filled 2023-08-05: qty 90, 90d supply, fill #0

## 2023-08-05 MED ORDER — METOPROLOL SUCCINATE ER 25 MG PO TB24
75.0000 mg | ORAL_TABLET | Freq: Every day | ORAL | 0 refills | Status: DC
  Filled 2023-08-05: qty 270, 90d supply, fill #0

## 2023-08-05 MED ORDER — POTASSIUM CHLORIDE CRYS ER 20 MEQ PO TBCR
20.0000 meq | EXTENDED_RELEASE_TABLET | Freq: Every day | ORAL | 0 refills | Status: DC | PRN
  Filled 2023-08-05: qty 90, 90d supply, fill #0

## 2023-08-05 MED ORDER — EZETIMIBE 10 MG PO TABS
10.0000 mg | ORAL_TABLET | Freq: Every day | ORAL | 0 refills | Status: DC
  Filled 2023-08-05: qty 90, 90d supply, fill #0

## 2023-08-05 MED ORDER — GLIPIZIDE ER 10 MG PO TB24
10.0000 mg | ORAL_TABLET | Freq: Every day | ORAL | 0 refills | Status: DC
  Filled 2023-08-05: qty 90, 90d supply, fill #0

## 2023-08-05 NOTE — Telephone Encounter (Signed)
 Requested Prescriptions  Pending Prescriptions Disp Refills   sitaGLIPtin  (JANUVIA ) 100 MG tablet 90 tablet 1    Sig: Take 1 tablet (100 mg total) by mouth daily.     Endocrinology:  Diabetes - DPP-4 Inhibitors Failed - 08/05/2023  1:35 AM      Failed - HBA1C is between 0 and 7.9 and within 180 days    Hemoglobin-A1c  Date Value Ref Range Status  09/02/2011 10.7 (H) 5.4 - 7.4 % Final   Hgb A1c MFr Bld  Date Value Ref Range Status  12/16/2022 6.4 (H) <5.7 % of total Hgb Final    Comment:    For someone without known diabetes, a hemoglobin  A1c value between 5.7% and 6.4% is consistent with prediabetes and should be confirmed with a  follow-up test. . For someone with known diabetes, a value <7% indicates that their diabetes is well controlled. A1c targets should be individualized based on duration of diabetes, age, comorbid conditions, and other considerations. . This assay result is consistent with an increased risk of diabetes. . Currently, no consensus exists regarding use of hemoglobin A1c for diagnosis of diabetes for children. .          Failed - Valid encounter within last 6 months    Recent Outpatient Visits           7 months ago Controlled type 2 diabetes mellitus without complication, without long-term current use of insulin  (HCC)   [place] [facility] Austine Lefort, MD   1 year ago Controlled type 2 diabetes mellitus without complication, without long-term current use of insulin  [facility])   [place] [facility] Pickard, Cisco Crest, MD              Passed - Cr in normal range and within 360 days    Creat  Date Value Ref Range Status  12/16/2022 0.90 0.70 - 1.35 mg/dL Final   Creatinine, Urine  Date Value Ref Range Status  12/16/2022 172 20 - 320 mg/dL Final          ezetimibe  (ZETIA ) 10 MG tablet 90 tablet 1    Sig: Take 1 tablet (10 mg total) by mouth daily.     Cardiovascular:  Antilipid - Sterol  Transport Inhibitors Failed - 08/05/2023  1:35 AM      Failed - Lipid Panel in normal range within the last 12 months    Cholesterol  Date Value Ref Range Status  12/16/2022 130 <200 mg/dL Final   LDL Cholesterol (Calc)  Date Value Ref Range Status  12/16/2022 62 mg/dL (calc) Final    Comment:    Reference range: <100 . Desirable range <100 mg/dL for primary prevention;   <70 mg/dL for patients with CHD or diabetic patients  with > or = 2 CHD risk factors. Aaron Aas LDL-C is now calculated using the Martin-Hopkins  calculation, which is a validated novel method providing  better accuracy than the Friedewald equation in the  estimation of LDL-C.  Melinda Sprawls et al. Erroll Heard. 1610;960(45): 2061-2068  (http://education.[employer].com/faq/FAQ164)    Direct LDL  Date Value Ref Range Status  05/15/2014 155.0 mg/dL Final    Comment:    Optimal:  <100 mg/dLNear or Above Optimal:  100-129 mg/dLBorderline High:  130-159 mg/dLHigh:  160-189 mg/dLVery High:  >190 mg/dL   HDL  Date Value Ref Range Status  12/16/2022 48 > OR = 40 mg/dL Final   Triglycerides  Date Value Ref Range Status  12/16/2022  110 <150 mg/dL Final         Passed - AST in normal range and within 360 days    AST  Date Value Ref Range Status  12/16/2022 16 10 - 35 U/L Final         Passed - ALT in normal range and within 360 days    ALT  Date Value Ref Range Status  12/16/2022 15 9 - 46 U/L Final         Passed - Patient is not pregnant      Passed - Valid encounter within last 12 months    Recent Outpatient Visits           7 months ago Controlled type 2 diabetes mellitus without complication, without long-term current use of insulin  (HCC)   [place] [facility] Austine Lefort, MD   1 year ago Controlled type 2 diabetes mellitus without complication, without long-term current use of insulin  [facility])   [place] [facility] Pickard, Cisco Crest, MD                potassium chloride  SA (KLOR-CON  M) 20 MEQ tablet 90 tablet 0    Sig: Take 1 tablet (20 mEq total) by mouth daily as needed.     Endocrinology:  Minerals - Potassium Supplementation Passed - 08/05/2023  1:35 AM      Passed - K in normal range and within 360 days    Potassium  Date Value Ref Range Status  12/16/2022 3.8 3.5 - 5.3 mmol/L Final         Passed - Cr in normal range and within 360 days    Creat  Date Value Ref Range Status  12/16/2022 0.90 0.70 - 1.35 mg/dL Final   Creatinine, Urine  Date Value Ref Range Status  12/16/2022 172 20 - 320 mg/dL Final         Passed - Valid encounter within last 12 months    Recent Outpatient Visits           7 months ago Controlled type 2 diabetes mellitus without complication, without long-term current use of insulin  (HCC)   [place] [facility] Austine Lefort, MD   1 year ago Controlled type 2 diabetes mellitus without complication, without long-term current use of insulin  [facility])   [place] [facility] Pickard, Cisco Crest, MD               metoprolol  succinate (TOPROL -XL) 25 MG 24 hr tablet 270 tablet 0    Sig: Take 3 tablets (75 mg total) by mouth daily.     Cardiovascular:  Beta Blockers Failed - 08/05/2023  1:35 AM      Failed - Valid encounter within last 6 months    Recent Outpatient Visits           7 months ago Controlled type 2 diabetes mellitus without complication, without long-term current use of insulin  [facility])   [place] [facility] Austine Lefort, MD   1 year ago Controlled type 2 diabetes mellitus without complication, without long-term current use of insulin  [facility])   [place] [facility] Pickard, Cisco Crest, MD              Passed - Last BP in normal range    BP Readings from Last 1 Encounters:  02/23/23 118/70         Passed - Last Heart  Rate in normal range    Pulse Readings from Last 1 Encounters:  02/23/23 80           glipiZIDE  (GLUCOTROL  XL) 10 MG 24 hr tablet 90 tablet 0    Sig: Take 1 tablet (10 mg total) by mouth daily with breakfast. Do Not Crush.     Endocrinology:  Diabetes - Sulfonylureas Failed - 08/05/2023  1:35 AM      Failed - HBA1C is between 0 and 7.9 and within 180 days    Hemoglobin-A1c  Date Value Ref Range Status  09/02/2011 10.7 (H) 5.4 - 7.4 % Final   Hgb A1c MFr Bld  Date Value Ref Range Status  12/16/2022 6.4 (H) <5.7 % of total Hgb Final    Comment:    For someone without known diabetes, a hemoglobin  A1c value between 5.7% and 6.4% is consistent with prediabetes and should be confirmed with a  follow-up test. . For someone with known diabetes, a value <7% indicates that their diabetes is well controlled. A1c targets should be individualized based on duration of diabetes, age, comorbid conditions, and other considerations. . This assay result is consistent with an increased risk of diabetes. . Currently, no consensus exists regarding use of hemoglobin A1c for diagnosis of diabetes for children. .          Failed - Valid encounter within last 6 months    Recent Outpatient Visits           7 months ago Controlled type 2 diabetes mellitus without complication, without long-term current use of insulin  [facility])   [place] [facility] Austine Lefort, MD   1 year ago Controlled type 2 diabetes mellitus without complication, without long-term current use of insulin  [facility])   [place] [facility] Pickard, Cisco Crest, MD              Passed - Cr in normal range and within 360 days    Creat  Date Value Ref Range Status  12/16/2022 0.90 0.70 - 1.35 mg/dL Final   Creatinine, Urine  Date Value Ref Range Status  12/16/2022 172 20 - 320 mg/dL Final

## 2023-08-06 ENCOUNTER — Other Ambulatory Visit (HOSPITAL_COMMUNITY): Payer: Self-pay

## 2023-08-07 ENCOUNTER — Other Ambulatory Visit (HOSPITAL_COMMUNITY): Payer: Self-pay

## 2023-08-29 ENCOUNTER — Other Ambulatory Visit: Payer: Self-pay

## 2023-09-27 ENCOUNTER — Other Ambulatory Visit: Payer: Self-pay | Admitting: Family Medicine

## 2023-09-27 ENCOUNTER — Other Ambulatory Visit (HOSPITAL_COMMUNITY): Payer: Self-pay | Admitting: Cardiology

## 2023-09-27 ENCOUNTER — Other Ambulatory Visit (HOSPITAL_COMMUNITY): Payer: Self-pay

## 2023-09-28 ENCOUNTER — Ambulatory Visit

## 2023-09-28 DIAGNOSIS — E119 Type 2 diabetes mellitus without complications: Secondary | ICD-10-CM

## 2023-09-28 NOTE — Telephone Encounter (Signed)
 Requested Prescriptions  Refused Prescriptions Disp Refills   sitaGLIPtin  (JANUVIA ) 100 MG tablet 90 tablet 0    Sig: Take 1 tablet (100 mg total) by mouth daily.     Endocrinology:  Diabetes - DPP-4 Inhibitors Failed - 09/28/2023  5:04 PM      Failed - HBA1C is between 0 and 7.9 and within 180 days    Hemoglobin-A1c  Date Value Ref Range Status  09/02/2011 10.7 (H) 5.4 - 7.4 % Final   Hgb A1c MFr Bld  Date Value Ref Range Status  12/16/2022 6.4 (H) <5.7 % of total Hgb Final    Comment:    For someone without known diabetes, a hemoglobin  A1c value between 5.7% and 6.4% is consistent with prediabetes and should be confirmed with a  follow-up test. . For someone with known diabetes, a value <7% indicates that their diabetes is well controlled. A1c targets should be individualized based on duration of diabetes, age, comorbid conditions, and other considerations. . This assay result is consistent with an increased risk of diabetes. . Currently, no consensus exists regarding use of hemoglobin A1c for diagnosis of diabetes for children. .          Failed - Valid encounter within last 6 months    Recent Outpatient Visits           9 months ago Controlled type 2 diabetes mellitus without complication, without long-term current use of insulin  (HCC)   [place] [facility] Family Medicine [name] DASEN, MD   1 year ago Controlled type 2 diabetes mellitus without complication, without long-term current use of insulin  [facility])   [place] [facility] Medicine Pickard, Butler DASEN, MD              Passed - Cr in normal range and within 360 days    Creat  Date Value Ref Range Status  12/16/2022 0.90 0.70 - 1.35 mg/dL Final   Creatinine, Urine  Date Value Ref Range Status  12/16/2022 172 20 - 320 mg/dL Final          ezetimibe  (ZETIA ) 10 MG tablet 90 tablet 0    Sig: Take 1 tablet (10 mg total) by mouth daily.     Cardiovascular:  Antilipid - Sterol  Transport Inhibitors Failed - 09/28/2023  5:04 PM      Failed - Lipid Panel in normal range within the last 12 months    Cholesterol  Date Value Ref Range Status  12/16/2022 130 <200 mg/dL Final   LDL Cholesterol (Calc)  Date Value Ref Range Status  12/16/2022 62 mg/dL (calc) Final    Comment:    Reference range: <100 . Desirable range <100 mg/dL for primary prevention;   <70 mg/dL for patients with CHD or diabetic patients  with > or = 2 CHD risk factors. SABRA LDL-C is now calculated using the Martin-Hopkins  calculation, which is a validated novel method providing  better accuracy than the Friedewald equation in the  estimation of LDL-C.  Gladis APPLETHWAITE et al. SANDREA. 7986;689(80): 2061-2068  (http://education.[facility].com/faq/FAQ164)    Direct LDL  Date Value Ref Range Status  05/15/2014 155.0 mg/dL Final    Comment:    Optimal:  <100 mg/dLNear or Above Optimal:  100-129 mg/dLBorderline High:  130-159 mg/dLHigh:  160-189 mg/dLVery High:  >190 mg/dL   HDL  Date Value Ref Range Status  12/16/2022 48 > OR = 40 mg/dL Final   Triglycerides  Date Value Ref Range Status  12/16/2022  110 <150 mg/dL Final         Passed - AST in normal range and within 360 days    AST  Date Value Ref Range Status  12/16/2022 16 10 - 35 U/L Final         Passed - ALT in normal range and within 360 days    ALT  Date Value Ref Range Status  12/16/2022 15 9 - 46 U/L Final         Passed - Patient is not pregnant      Passed - Valid encounter within last 12 months    Recent Outpatient Visits           9 months ago Controlled type 2 diabetes mellitus without complication, without long-term current use of insulin  (HCC)   [place] [facility] Family Medicine [name] DASEN, MD   1 year ago Controlled type 2 diabetes mellitus without complication, without long-term current use of insulin  [facility])   [place] [facility] Medicine Pickard, Butler DASEN, MD                potassium chloride  SA (KLOR-CON  M) 20 MEQ tablet 90 tablet 0    Sig: Take 1 tablet (20 mEq total) by mouth daily as needed.     Endocrinology:  Minerals - Potassium Supplementation Passed - 09/28/2023  5:04 PM      Passed - K in normal range and within 360 days    Potassium  Date Value Ref Range Status  12/16/2022 3.8 3.5 - 5.3 mmol/L Final         Passed - Cr in normal range and within 360 days    Creat  Date Value Ref Range Status  12/16/2022 0.90 0.70 - 1.35 mg/dL Final   Creatinine, Urine  Date Value Ref Range Status  12/16/2022 172 20 - 320 mg/dL Final         Passed - Valid encounter within last 12 months    Recent Outpatient Visits           9 months ago Controlled type 2 diabetes mellitus without complication, without long-term current use of insulin  [facility])   [place] [facility] Medicine [name] DASEN, MD   1 year ago Controlled type 2 diabetes mellitus without complication, without long-term current use of insulin  [facility])   [place] [facility] Pickard, Butler DASEN, MD               metoprolol  succinate (TOPROL -XL) 25 MG 24 hr tablet 270 tablet 0    Sig: Take 3 tablets (75 mg total) by mouth daily.     Cardiovascular:  Beta Blockers Failed - 09/28/2023  5:04 PM      Failed - Valid encounter within last 6 months    Recent Outpatient Visits           9 months ago Controlled type 2 diabetes mellitus without complication, without long-term current use of insulin  [facility])   [place] [facility] Medicine [name] DASEN, MD   1 year ago Controlled type 2 diabetes mellitus without complication, without long-term current use of insulin  [facility])   [place] [facility] Medicine Pickard, Butler DASEN, MD              Passed - Last BP in normal range    BP Readings from Last 1 Encounters:  02/23/23 118/70         Passed - Last Heart  Rate in normal range    Pulse Readings from Last 1 Encounters:  02/23/23 80           glipiZIDE  (GLUCOTROL  XL) 10 MG 24 hr tablet 90 tablet 0    Sig: Take 1 tablet (10 mg total) by mouth daily with breakfast. Do Not Crush.     Endocrinology:  Diabetes - Sulfonylureas Failed - 09/28/2023  5:04 PM      Failed - HBA1C is between 0 and 7.9 and within 180 days    Hemoglobin-A1c  Date Value Ref Range Status  09/02/2011 10.7 (H) 5.4 - 7.4 % Final   Hgb A1c MFr Bld  Date Value Ref Range Status  12/16/2022 6.4 (H) <5.7 % of total Hgb Final    Comment:    For someone without known diabetes, a hemoglobin  A1c value between 5.7% and 6.4% is consistent with prediabetes and should be confirmed with a  follow-up test. . For someone with known diabetes, a value <7% indicates that their diabetes is well controlled. A1c targets should be individualized based on duration of diabetes, age, comorbid conditions, and other considerations. . This assay result is consistent with an increased risk of diabetes. . Currently, no consensus exists regarding use of hemoglobin A1c for diagnosis of diabetes for children. .          Failed - Valid encounter within last 6 months    Recent Outpatient Visits           9 months ago Controlled type 2 diabetes mellitus without complication, without long-term current use of insulin  [facility])   [facility] Family Medicine [name] DASEN, MD   1 year ago Controlled type 2 diabetes mellitus without complication, without long-term current use of insulin  [facility])   [place] [facility] Medicine Pickard, Butler DASEN, MD              Passed - Cr in normal range and within 360 days    Creat  Date Value Ref Range Status  12/16/2022 0.90 0.70 - 1.35 mg/dL Final   Creatinine, Urine  Date Value Ref Range Status  12/16/2022 172 20 - 320 mg/dL Final

## 2023-09-28 NOTE — Progress Notes (Signed)
 Derrick Todd arrived 09/28/2023 and has given verbal consent to obtain images and complete their overdue diabetic retinal screening.  The images have been sent to an ophthalmologist or optometrist for review and interpretation.  Results will be sent back to [name] DASEN, MD for review.  Patient has been informed they will be contacted when we receive the results via telephone or MyChart

## 2023-09-29 ENCOUNTER — Other Ambulatory Visit: Payer: Self-pay | Admitting: Family Medicine

## 2023-09-29 ENCOUNTER — Other Ambulatory Visit (HOSPITAL_COMMUNITY): Payer: Self-pay

## 2023-09-29 ENCOUNTER — Encounter (HOSPITAL_COMMUNITY): Payer: Self-pay

## 2023-09-29 NOTE — Telephone Encounter (Signed)
 Second request for empagliflozin  (JARDIANCE ) 25 MG TABS, pantoprazole  (PROTONIX ) 40 MG tablet, lisinopril  (ZESTRIL ) 5 MG tablet

## 2023-09-29 NOTE — Telephone Encounter (Unsigned)
 Copied from CRM [id number]. Topic: Clinical - Medication Refill >> Sep 29, 2023  7:49 AM Charlet HERO wrote: Medication: ezetimibe  (ZETIA ) 10 MG tablet,empagliflozin  (JARDIANCE ) 25 MG TABS tablet,sitaGLIPtin  (JANUVIA ) 100 MG tablet,lisinopril  (ZESTRIL ) 5 MG tablet,metoprolol  succinate (TOPROL -XL) 25 MG 24 hr tablet,potassium chloride  SA (KLOR-CON  M) 20 MEQ tablet,pantoprazole  (PROTONIX ) 40 MG tablet and glipiZIDE  (GLUCOTROL  XL) 10 MG 24 hr tablet  Has the patient contacted their pharmacy? Yes To call the office  This is the patient's preferred pharmacy:  [facility] [address], [address] [place] [address] Phone: [phone] Fax: [phone]  Is this the correct pharmacy for this prescription? Yes If no, delete pharmacy and type the correct one.   Has the prescription been filled recently? Yes  Is the patient out of the medication? Yes  Has the patient been seen for an appointment in the last year OR does the patient have an upcoming appointment? Yes  Can we respond through MyChart? Yes  Agent: Please be advised that Rx refills may take up to 3 business days. We ask that you follow-up with your pharmacy.

## 2023-10-02 ENCOUNTER — Other Ambulatory Visit (HOSPITAL_COMMUNITY): Payer: Self-pay

## 2023-10-02 MED ORDER — LISINOPRIL 5 MG PO TABS
5.0000 mg | ORAL_TABLET | Freq: Every day | ORAL | 1 refills | Status: AC
  Filled 2023-10-02 – 2024-04-22 (×2): qty 30, 30d supply, fill #0

## 2023-10-02 MED ORDER — PANTOPRAZOLE SODIUM 40 MG PO TBEC
40.0000 mg | DELAYED_RELEASE_TABLET | Freq: Every day | ORAL | 1 refills | Status: AC
  Filled 2023-10-02 – 2024-04-22 (×4): qty 30, 30d supply, fill #0

## 2023-10-02 MED ORDER — ROSUVASTATIN CALCIUM 40 MG PO TABS
40.0000 mg | ORAL_TABLET | Freq: Every day | ORAL | 1 refills | Status: AC
  Filled 2023-10-02 – 2024-04-22 (×2): qty 30, 30d supply, fill #0

## 2023-10-02 MED ORDER — RIVAROXABAN 2.5 MG PO TABS
2.5000 mg | ORAL_TABLET | Freq: Two times a day (BID) | ORAL | 1 refills | Status: AC
  Filled 2023-10-02 – 2024-04-22 (×2): qty 30, 15d supply, fill #0

## 2023-10-02 MED ORDER — ASPIRIN 81 MG PO TBEC
81.0000 mg | DELAYED_RELEASE_TABLET | Freq: Every day | ORAL | 1 refills | Status: DC
  Filled 2023-10-02: qty 30, 30d supply, fill #0

## 2023-10-02 MED ORDER — EMPAGLIFLOZIN 25 MG PO TABS
25.0000 mg | ORAL_TABLET | Freq: Every day | ORAL | 1 refills | Status: AC
  Filled 2023-10-02 – 2023-11-06 (×2): qty 30, 30d supply, fill #0
  Filled 2024-04-22: qty 30, 30d supply, fill #1

## 2023-10-02 NOTE — Telephone Encounter (Signed)
 Too soon for refill. Duplicate request.  Requested Prescriptions  Pending Prescriptions Disp Refills   ezetimibe  (ZETIA ) 10 MG tablet 90 tablet 0    Sig: Take 1 tablet (10 mg total) by mouth daily.     Cardiovascular:  Antilipid - Sterol Transport Inhibitors Failed - 10/02/2023  8:59 AM      Failed - Lipid Panel in normal range within the last 12 months    Cholesterol  Date Value Ref Range Status  12/16/2022 130 <200 mg/dL Final   LDL Cholesterol (Calc)  Date Value Ref Range Status  12/16/2022 62 mg/dL (calc) Final    Comment:    Reference range: <100 . Desirable range <100 mg/dL for primary prevention;   <70 mg/dL for patients with CHD or diabetic patients  with > or = 2 CHD risk factors. SABRA LDL-C is now calculated using the Martin-Hopkins  calculation, which is a validated novel method providing  better accuracy than the Friedewald equation in the  estimation of LDL-C.  Gladis APPLETHWAITE et al. SANDREA. 7986;689(80): 2061-2068  (http://education.[facility].com/faq/FAQ164)    Direct LDL  Date Value Ref Range Status  05/15/2014 155.0 mg/dL Final    Comment:    Optimal:  <100 mg/dLNear or Above Optimal:  100-129 mg/dLBorderline High:  130-159 mg/dLHigh:  160-189 mg/dLVery High:  >190 mg/dL   HDL  Date Value Ref Range Status  12/16/2022 48 > OR = 40 mg/dL Final   Triglycerides  Date Value Ref Range Status  12/16/2022 110 <150 mg/dL Final         Passed - AST in normal range and within 360 days    AST  Date Value Ref Range Status  12/16/2022 16 10 - 35 U/L Final         Passed - ALT in normal range and within 360 days    ALT  Date Value Ref Range Status  12/16/2022 15 9 - 46 U/L Final         Passed - Patient is not pregnant      Passed - Valid encounter within last 12 months    Recent Outpatient Visits           9 months ago Controlled type 2 diabetes mellitus without complication, without long-term current use of insulin  (HCC)   [place]  Family Medicine [name] DASEN, MD   1 year ago Controlled type 2 diabetes mellitus without complication, without long-term current use of insulin  [facility])   [facility] Medicine Pickard, Butler DASEN, MD               sitaGLIPtin  (JANUVIA ) 100 MG tablet 90 tablet 0    Sig: Take 1 tablet (100 mg total) by mouth daily.     Endocrinology:  Diabetes - DPP-4 Inhibitors Failed - 10/02/2023  8:59 AM      Failed - HBA1C is between 0 and 7.9 and within 180 days    Hemoglobin-A1c  Date Value Ref Range Status  09/02/2011 10.7 (H) 5.4 - 7.4 % Final   Hgb A1c MFr Bld  Date Value Ref Range Status  12/16/2022 6.4 (H) <5.7 % of total Hgb Final    Comment:    For someone without known diabetes, a hemoglobin  A1c value between 5.7% and 6.4% is consistent with prediabetes and should be confirmed with a  follow-up test. . For someone with known diabetes, a value <7% indicates that their diabetes is well controlled. A1c targets should be individualized based  on duration of diabetes, age, comorbid conditions, and other considerations. . This assay result is consistent with an increased risk of diabetes. . Currently, no consensus exists regarding use of hemoglobin A1c for diagnosis of diabetes for children. .          Failed - Valid encounter within last 6 months    Recent Outpatient Visits           9 months ago Controlled type 2 diabetes mellitus without complication, without long-term current use of insulin  (HCC)   [place] [facility] Family Medicine Pickard, Butler DASEN, MD   1 year ago Controlled type 2 diabetes mellitus without complication, without long-term current use of insulin  [facility])   [place] [facility] Medicine Pickard, Butler DASEN, MD              Passed - Cr in normal range and within 360 days    Creat  Date Value Ref Range Status  12/16/2022 0.90 0.70 - 1.35 mg/dL Final   Creatinine, Urine  Date Value Ref Range Status  12/16/2022  172 20 - 320 mg/dL Final          metoprolol  succinate (TOPROL -XL) 25 MG 24 hr tablet 270 tablet 0    Sig: Take 3 tablets (75 mg total) by mouth daily.     Cardiovascular:  Beta Blockers Failed - 10/02/2023  8:59 AM      Failed - Valid encounter within last 6 months    Recent Outpatient Visits           9 months ago Controlled type 2 diabetes mellitus without complication, without long-term current use of insulin  (HCC)   [place] [facility] Medicine [name] DASEN, MD   1 year ago Controlled type 2 diabetes mellitus without complication, without long-term current use of insulin  [facility])   [place] [facility] Medicine Pickard, Butler DASEN, MD              Passed - Last BP in normal range    BP Readings from Last 1 Encounters:  02/23/23 118/70         Passed - Last Heart Rate in normal range    Pulse Readings from Last 1 Encounters:  02/23/23 80          glipiZIDE  (GLUCOTROL  XL) 10 MG 24 hr tablet 90 tablet 0    Sig: Take 1 tablet (10 mg total) by mouth daily with breakfast. Do Not Crush.     Endocrinology:  Diabetes - Sulfonylureas Failed - 10/02/2023  8:59 AM      Failed - HBA1C is between 0 and 7.9 and within 180 days    Hemoglobin-A1c  Date Value Ref Range Status  09/02/2011 10.7 (H) 5.4 - 7.4 % Final   Hgb A1c MFr Bld  Date Value Ref Range Status  12/16/2022 6.4 (H) <5.7 % of total Hgb Final    Comment:    For someone without known diabetes, a hemoglobin  A1c value between 5.7% and 6.4% is consistent with prediabetes and should be confirmed with a  follow-up test. . For someone with known diabetes, a value <7% indicates that their diabetes is well controlled. A1c targets should be individualized based on duration of diabetes, age, comorbid conditions, and other considerations. . This assay result is consistent with an increased risk of diabetes. . Currently, no consensus exists regarding use of hemoglobin A1c for diagnosis of  diabetes for children. SABRA  Failed - Valid encounter within last 6 months    Recent Outpatient Visits           9 months ago Controlled type 2 diabetes mellitus without complication, without long-term current use of insulin  (HCC)   [place] [facility] Family Medicine [name] DASEN, MD   1 year ago Controlled type 2 diabetes mellitus without complication, without long-term current use of insulin  [facility])   [place] [facility] Medicine Pickard, Butler DASEN, MD              Passed - Cr in normal range and within 360 days    Creat  Date Value Ref Range Status  12/16/2022 0.90 0.70 - 1.35 mg/dL Final   Creatinine, Urine  Date Value Ref Range Status  12/16/2022 172 20 - 320 mg/dL Final          potassium chloride  SA (KLOR-CON  M) 20 MEQ tablet 90 tablet 0    Sig: Take 1 tablet (20 mEq total) by mouth daily as needed.     Endocrinology:  Minerals - Potassium Supplementation Passed - 10/02/2023  8:59 AM      Passed - K in normal range and within 360 days    Potassium  Date Value Ref Range Status  12/16/2022 3.8 3.5 - 5.3 mmol/L Final         Passed - Cr in normal range and within 360 days    Creat  Date Value Ref Range Status  12/16/2022 0.90 0.70 - 1.35 mg/dL Final   Creatinine, Urine  Date Value Ref Range Status  12/16/2022 172 20 - 320 mg/dL Final         Passed - Valid encounter within last 12 months    Recent Outpatient Visits           9 months ago Controlled type 2 diabetes mellitus without complication, without long-term current use of insulin  [facility])   [place] [facility] Medicine [name] DASEN, MD   1 year ago Controlled type 2 diabetes mellitus without complication, without long-term current use of insulin  [facility])   [facility] Family Medicine Pickard, Butler DASEN, MD

## 2023-10-04 ENCOUNTER — Ambulatory Visit (INDEPENDENT_AMBULATORY_CARE_PROVIDER_SITE_OTHER)

## 2023-10-04 DIAGNOSIS — E119 Type 2 diabetes mellitus without complications: Secondary | ICD-10-CM

## 2023-10-04 LAB — HM DIABETES EYE EXAM

## 2023-10-04 NOTE — Progress Notes (Signed)
 Derrick Todd arrived 10/04/2023 and has given verbal consent to obtain images and complete their overdue diabetic retinal screening.  The images have been sent to an ophthalmologist or optometrist for review and interpretation.  Results will be sent back to [name] DASEN, MD for review.  Patient has been informed they will be contacted when we receive the results via telephone or MyChart

## 2023-10-11 ENCOUNTER — Other Ambulatory Visit (HOSPITAL_COMMUNITY): Payer: Self-pay

## 2023-10-11 NOTE — Progress Notes (Signed)
 Received results- No diabetic retinopathy shown. Routed exam to provider. Patient is aware of results and NO referral needed.

## 2023-10-12 ENCOUNTER — Other Ambulatory Visit (HOSPITAL_COMMUNITY): Payer: Self-pay

## 2023-10-28 ENCOUNTER — Other Ambulatory Visit (HOSPITAL_BASED_OUTPATIENT_CLINIC_OR_DEPARTMENT_OTHER): Payer: Self-pay

## 2023-11-06 ENCOUNTER — Other Ambulatory Visit: Payer: Self-pay | Admitting: Family Medicine
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
## 2023-11-07 ENCOUNTER — Other Ambulatory Visit: Payer: Self-pay

## 2023-11-07 ENCOUNTER — Other Ambulatory Visit (HOSPITAL_BASED_OUTPATIENT_CLINIC_OR_DEPARTMENT_OTHER): Payer: Self-pay

## 2023-11-07 MED ORDER — POTASSIUM CHLORIDE CRYS ER 20 MEQ PO TBCR
20.0000 meq | EXTENDED_RELEASE_TABLET | Freq: Every day | ORAL | 0 refills | Status: DC | PRN
  Filled 2023-11-07: qty 90, 90d supply, fill #0

## 2023-11-07 MED ORDER — SITAGLIPTIN PHOSPHATE 100 MG PO TABS
100.0000 mg | ORAL_TABLET | Freq: Every day | ORAL | 0 refills | Status: DC
  Filled 2023-11-07: qty 90, 90d supply, fill #0

## 2023-11-20 ENCOUNTER — Encounter (INDEPENDENT_AMBULATORY_CARE_PROVIDER_SITE_OTHER): Payer: Self-pay | Admitting: Ophthalmology

## 2023-11-20 ENCOUNTER — Ambulatory Visit (INDEPENDENT_AMBULATORY_CARE_PROVIDER_SITE_OTHER): Admitting: Ophthalmology

## 2023-11-20 DIAGNOSIS — Z961 Presence of intraocular lens: Secondary | ICD-10-CM

## 2023-11-20 DIAGNOSIS — E119 Type 2 diabetes mellitus without complications: Secondary | ICD-10-CM | POA: Diagnosis not present

## 2023-11-20 DIAGNOSIS — H3522 Other non-diabetic proliferative retinopathy, left eye: Secondary | ICD-10-CM | POA: Diagnosis not present

## 2023-11-20 DIAGNOSIS — I10 Essential (primary) hypertension: Secondary | ICD-10-CM

## 2023-11-20 DIAGNOSIS — H3322 Serous retinal detachment, left eye: Secondary | ICD-10-CM | POA: Diagnosis not present

## 2023-11-20 DIAGNOSIS — H33321 Round hole, right eye: Secondary | ICD-10-CM | POA: Diagnosis not present

## 2023-11-20 DIAGNOSIS — H35411 Lattice degeneration of retina, right eye: Secondary | ICD-10-CM

## 2023-11-20 DIAGNOSIS — H16232 Neurotrophic keratoconjunctivitis, left eye: Secondary | ICD-10-CM

## 2023-11-20 DIAGNOSIS — H353221 Exudative age-related macular degeneration, left eye, with active choroidal neovascularization: Secondary | ICD-10-CM | POA: Diagnosis not present

## 2023-11-20 DIAGNOSIS — H25811 Combined forms of age-related cataract, right eye: Secondary | ICD-10-CM

## 2023-11-20 DIAGNOSIS — H35052 Retinal neovascularization, unspecified, left eye: Secondary | ICD-10-CM

## 2023-11-20 DIAGNOSIS — H35033 Hypertensive retinopathy, bilateral: Secondary | ICD-10-CM | POA: Diagnosis not present

## 2023-11-20 DIAGNOSIS — H1820 Unspecified corneal edema: Secondary | ICD-10-CM

## 2023-11-21 ENCOUNTER — Encounter (INDEPENDENT_AMBULATORY_CARE_PROVIDER_SITE_OTHER): Payer: Self-pay | Admitting: Ophthalmology

## 2023-11-21 MED ORDER — BEVACIZUMAB CHEMO INJECTION 1.25MG/0.05ML SYRINGE FOR KALEIDOSCOPE
1.2500 mg | INTRAVITREAL | Status: AC | PRN
  Administered 2023-11-21: 1.25 mg via INTRAVITREAL
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
## 2023-12-18 ENCOUNTER — Encounter (INDEPENDENT_AMBULATORY_CARE_PROVIDER_SITE_OTHER): Payer: Self-pay

## 2023-12-18 ENCOUNTER — Encounter (INDEPENDENT_AMBULATORY_CARE_PROVIDER_SITE_OTHER): Admitting: Ophthalmology

## 2023-12-18 DIAGNOSIS — H25811 Combined forms of age-related cataract, right eye: Secondary | ICD-10-CM

## 2023-12-18 DIAGNOSIS — H35033 Hypertensive retinopathy, bilateral: Secondary | ICD-10-CM

## 2023-12-18 DIAGNOSIS — H3522 Other non-diabetic proliferative retinopathy, left eye: Secondary | ICD-10-CM

## 2023-12-18 DIAGNOSIS — Z961 Presence of intraocular lens: Secondary | ICD-10-CM

## 2023-12-18 DIAGNOSIS — H35052 Retinal neovascularization, unspecified, left eye: Secondary | ICD-10-CM

## 2023-12-18 DIAGNOSIS — E119 Type 2 diabetes mellitus without complications: Secondary | ICD-10-CM

## 2023-12-18 DIAGNOSIS — H35411 Lattice degeneration of retina, right eye: Secondary | ICD-10-CM

## 2023-12-18 DIAGNOSIS — H353221 Exudative age-related macular degeneration, left eye, with active choroidal neovascularization: Secondary | ICD-10-CM

## 2023-12-18 DIAGNOSIS — H3322 Serous retinal detachment, left eye: Secondary | ICD-10-CM

## 2023-12-18 DIAGNOSIS — H16232 Neurotrophic keratoconjunctivitis, left eye: Secondary | ICD-10-CM

## 2023-12-18 DIAGNOSIS — H1820 Unspecified corneal edema: Secondary | ICD-10-CM

## 2023-12-18 DIAGNOSIS — H33321 Round hole, right eye: Secondary | ICD-10-CM

## 2023-12-18 DIAGNOSIS — I10 Essential (primary) hypertension: Secondary | ICD-10-CM
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
## 2023-12-21 ENCOUNTER — Ambulatory Visit (INDEPENDENT_AMBULATORY_CARE_PROVIDER_SITE_OTHER): Admitting: Ophthalmology

## 2023-12-21 ENCOUNTER — Encounter (INDEPENDENT_AMBULATORY_CARE_PROVIDER_SITE_OTHER): Payer: Self-pay | Admitting: Ophthalmology

## 2023-12-21 DIAGNOSIS — H35052 Retinal neovascularization, unspecified, left eye: Secondary | ICD-10-CM | POA: Diagnosis not present

## 2023-12-21 DIAGNOSIS — H33321 Round hole, right eye: Secondary | ICD-10-CM | POA: Diagnosis not present

## 2023-12-21 DIAGNOSIS — H1820 Unspecified corneal edema: Secondary | ICD-10-CM

## 2023-12-21 DIAGNOSIS — I10 Essential (primary) hypertension: Secondary | ICD-10-CM

## 2023-12-21 DIAGNOSIS — H3522 Other non-diabetic proliferative retinopathy, left eye: Secondary | ICD-10-CM | POA: Diagnosis not present

## 2023-12-21 DIAGNOSIS — H16232 Neurotrophic keratoconjunctivitis, left eye: Secondary | ICD-10-CM

## 2023-12-21 DIAGNOSIS — H3322 Serous retinal detachment, left eye: Secondary | ICD-10-CM | POA: Diagnosis not present

## 2023-12-21 DIAGNOSIS — H25811 Combined forms of age-related cataract, right eye: Secondary | ICD-10-CM

## 2023-12-21 DIAGNOSIS — H35033 Hypertensive retinopathy, bilateral: Secondary | ICD-10-CM

## 2023-12-21 DIAGNOSIS — H35411 Lattice degeneration of retina, right eye: Secondary | ICD-10-CM

## 2023-12-21 DIAGNOSIS — H353221 Exudative age-related macular degeneration, left eye, with active choroidal neovascularization: Secondary | ICD-10-CM

## 2023-12-21 DIAGNOSIS — Z961 Presence of intraocular lens: Secondary | ICD-10-CM

## 2023-12-21 DIAGNOSIS — E119 Type 2 diabetes mellitus without complications: Secondary | ICD-10-CM | POA: Diagnosis not present

## 2023-12-21 MED ORDER — BEVACIZUMAB CHEMO INJECTION 1.25MG/0.05ML SYRINGE FOR KALEIDOSCOPE
1.2500 mg | INTRAVITREAL | Status: AC | PRN
  Administered 2023-12-21: 1.25 mg via INTRAVITREAL

## 2023-12-22 ENCOUNTER — Ambulatory Visit: Admitting: Family Medicine

## 2023-12-22 ENCOUNTER — Encounter: Payer: Self-pay | Admitting: Family Medicine

## 2023-12-22 VITALS — BP 126/72 | HR 78 | Temp 97.8°F | Ht 74.0 in | Wt 221.2 lb

## 2023-12-22 DIAGNOSIS — Z125 Encounter for screening for malignant neoplasm of prostate: Secondary | ICD-10-CM

## 2023-12-22 DIAGNOSIS — E119 Type 2 diabetes mellitus without complications: Secondary | ICD-10-CM

## 2023-12-22 DIAGNOSIS — Z0001 Encounter for general adult medical examination with abnormal findings: Secondary | ICD-10-CM

## 2023-12-22 DIAGNOSIS — I2510 Atherosclerotic heart disease of native coronary artery without angina pectoris: Secondary | ICD-10-CM | POA: Diagnosis not present

## 2023-12-22 DIAGNOSIS — Z0000 Encounter for general adult medical examination without abnormal findings: Secondary | ICD-10-CM

## 2023-12-22 DIAGNOSIS — Z136 Encounter for screening for cardiovascular disorders: Secondary | ICD-10-CM

## 2023-12-22 DIAGNOSIS — Z122 Encounter for screening for malignant neoplasm of respiratory organs: Secondary | ICD-10-CM

## 2023-12-22 DIAGNOSIS — Z7984 Long term (current) use of oral hypoglycemic drugs: Secondary | ICD-10-CM

## 2023-12-22 DIAGNOSIS — E7800 Pure hypercholesterolemia, unspecified: Secondary | ICD-10-CM
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
## 2023-12-23 LAB — LIPID PANEL
Cholesterol: 121 mg/dL (ref <200)
HDL: 53 mg/dL (ref >=40)
LDL Cholesterol (Calc): 46 mg/dL
Non-HDL Cholesterol (Calc): 68 mg/dL (ref <130)
Total CHOL/HDL Ratio: 2.3 (calc) (ref <5.0)
Triglycerides: 136 mg/dL (ref <150)

## 2023-12-23 LAB — CBC WITH DIFFERENTIAL/PLATELET
Absolute Lymphocytes: 874 {cells}/uL (ref 850–3900)
Absolute Monocytes: 682 {cells}/uL (ref 200–950)
Basophils Absolute: 62 {cells}/uL (ref 0–200)
Basophils Relative: 1.3 %
Eosinophils Absolute: 240 {cells}/uL (ref 15–500)
Eosinophils Relative: 5 %
HCT: 43 % (ref 38.5–50.0)
Hemoglobin: 13.1 g/dL — ABNORMAL LOW (ref 13.2–17.1)
MCH: 23.9 pg — ABNORMAL LOW (ref 27.0–33.0)
MCHC: 30.5 g/dL — ABNORMAL LOW (ref 32.0–36.0)
MCV: 78.6 fL — ABNORMAL LOW (ref 80.0–100.0)
MPV: 9.9 fL (ref 7.5–12.5)
Monocytes Relative: 14.2 %
Neutro Abs: 2942 {cells}/uL (ref 1500–7800)
Neutrophils Relative %: 61.3 %
Platelets: 283 Thousand/uL (ref 140–400)
RBC: 5.47 Million/uL (ref 4.20–5.80)
RDW: 15.2 % — ABNORMAL HIGH (ref 11.0–15.0)
Total Lymphocyte: 18.2 %
WBC: 4.8 Thousand/uL (ref 3.8–10.8)

## 2023-12-23 LAB — COMPREHENSIVE METABOLIC PANEL WITH GFR
AG Ratio: 1.7 (calc) (ref 1.0–2.5)
ALT: 21 U/L (ref 9–46)
AST: 17 U/L (ref 10–35)
Albumin: 4.3 g/dL (ref 3.6–5.1)
Alkaline phosphatase (APISO): 59 U/L (ref 35–144)
BUN: 14 mg/dL (ref 7–25)
CO2: 24 mmol/L (ref 20–32)
Calcium: 9.4 mg/dL (ref 8.6–10.3)
Chloride: 105 mmol/L (ref 98–110)
Creat: 1.06 mg/dL (ref 0.70–1.35)
Globulin: 2.5 g/dL (ref 1.9–3.7)
Glucose, Bld: 88 mg/dL (ref 65–99)
Potassium: 3.9 mmol/L (ref 3.5–5.3)
Sodium: 141 mmol/L (ref 135–146)
Total Bilirubin: 0.6 mg/dL (ref 0.2–1.2)
Total Protein: 6.8 g/dL (ref 6.1–8.1)
eGFR: 78 mL/min/1.73m2 (ref >=60)

## 2023-12-23 LAB — HEMOGLOBIN A1C
Hgb A1c MFr Bld: 7 % — ABNORMAL HIGH (ref <5.7)
Mean Plasma Glucose: 154 mg/dL
eAG (mmol/L): 8.5 mmol/L

## 2023-12-23 LAB — PSA: PSA: 0.41 ng/mL (ref <=4.00)

## 2023-12-23 LAB — MICROALBUMIN / CREATININE URINE RATIO
Creatinine, Urine: 86 mg/dL (ref 20–320)
Microalb Creat Ratio: 3 mg/g{creat} (ref <30)
Microalb, Ur: 0.3 mg/dL

## 2023-12-25 ENCOUNTER — Ambulatory Visit: Payer: Self-pay | Admitting: Family Medicine

## 2023-12-29 ENCOUNTER — Ambulatory Visit (HOSPITAL_COMMUNITY): Admission: RE | Admit: 2023-12-29 | Source: Ambulatory Visit
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
## 2024-01-18 ENCOUNTER — Encounter (INDEPENDENT_AMBULATORY_CARE_PROVIDER_SITE_OTHER): Payer: Self-pay | Admitting: Ophthalmology

## 2024-01-18 ENCOUNTER — Ambulatory Visit (INDEPENDENT_AMBULATORY_CARE_PROVIDER_SITE_OTHER): Admitting: Ophthalmology

## 2024-01-18 DIAGNOSIS — H353221 Exudative age-related macular degeneration, left eye, with active choroidal neovascularization: Secondary | ICD-10-CM

## 2024-01-18 DIAGNOSIS — H35411 Lattice degeneration of retina, right eye: Secondary | ICD-10-CM

## 2024-01-18 DIAGNOSIS — H3522 Other non-diabetic proliferative retinopathy, left eye: Secondary | ICD-10-CM

## 2024-01-18 DIAGNOSIS — H3322 Serous retinal detachment, left eye: Secondary | ICD-10-CM | POA: Diagnosis not present

## 2024-01-18 DIAGNOSIS — H35033 Hypertensive retinopathy, bilateral: Secondary | ICD-10-CM

## 2024-01-18 DIAGNOSIS — Z961 Presence of intraocular lens: Secondary | ICD-10-CM | POA: Diagnosis not present

## 2024-01-18 DIAGNOSIS — I10 Essential (primary) hypertension: Secondary | ICD-10-CM

## 2024-01-18 DIAGNOSIS — H33321 Round hole, right eye: Secondary | ICD-10-CM | POA: Diagnosis not present

## 2024-01-18 DIAGNOSIS — E119 Type 2 diabetes mellitus without complications: Secondary | ICD-10-CM

## 2024-01-18 DIAGNOSIS — H35052 Retinal neovascularization, unspecified, left eye: Secondary | ICD-10-CM | POA: Diagnosis not present

## 2024-01-18 DIAGNOSIS — H16232 Neurotrophic keratoconjunctivitis, left eye: Secondary | ICD-10-CM

## 2024-01-18 DIAGNOSIS — H25811 Combined forms of age-related cataract, right eye: Secondary | ICD-10-CM | POA: Diagnosis not present

## 2024-01-18 DIAGNOSIS — H1820 Unspecified corneal edema: Secondary | ICD-10-CM

## 2024-01-18 MED ORDER — BEVACIZUMAB CHEMO INJECTION 1.25MG/0.05ML SYRINGE FOR KALEIDOSCOPE
1.2500 mg | INTRAVITREAL | Status: AC | PRN
  Administered 2024-01-18: 1.25 mg via INTRAVITREAL
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
## 2024-02-15 ENCOUNTER — Encounter (INDEPENDENT_AMBULATORY_CARE_PROVIDER_SITE_OTHER): Admitting: Ophthalmology

## 2024-02-15 ENCOUNTER — Encounter (INDEPENDENT_AMBULATORY_CARE_PROVIDER_SITE_OTHER): Payer: Self-pay

## 2024-02-15 DIAGNOSIS — I10 Essential (primary) hypertension: Secondary | ICD-10-CM

## 2024-02-15 DIAGNOSIS — H35411 Lattice degeneration of retina, right eye: Secondary | ICD-10-CM

## 2024-02-15 DIAGNOSIS — H35033 Hypertensive retinopathy, bilateral: Secondary | ICD-10-CM

## 2024-02-15 DIAGNOSIS — H25811 Combined forms of age-related cataract, right eye: Secondary | ICD-10-CM

## 2024-02-15 DIAGNOSIS — H353221 Exudative age-related macular degeneration, left eye, with active choroidal neovascularization: Secondary | ICD-10-CM

## 2024-02-15 DIAGNOSIS — Z961 Presence of intraocular lens: Secondary | ICD-10-CM

## 2024-02-15 DIAGNOSIS — E119 Type 2 diabetes mellitus without complications: Secondary | ICD-10-CM

## 2024-02-15 DIAGNOSIS — H3322 Serous retinal detachment, left eye: Secondary | ICD-10-CM

## 2024-02-15 DIAGNOSIS — H35052 Retinal neovascularization, unspecified, left eye: Secondary | ICD-10-CM

## 2024-02-15 DIAGNOSIS — H3522 Other non-diabetic proliferative retinopathy, left eye: Secondary | ICD-10-CM

## 2024-02-15 DIAGNOSIS — H1820 Unspecified corneal edema: Secondary | ICD-10-CM

## 2024-02-15 DIAGNOSIS — H16232 Neurotrophic keratoconjunctivitis, left eye: Secondary | ICD-10-CM

## 2024-02-15 DIAGNOSIS — H33321 Round hole, right eye: Secondary | ICD-10-CM
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
## 2024-02-27 ENCOUNTER — Ambulatory Visit (INDEPENDENT_AMBULATORY_CARE_PROVIDER_SITE_OTHER): Admitting: Ophthalmology

## 2024-02-27 ENCOUNTER — Encounter (INDEPENDENT_AMBULATORY_CARE_PROVIDER_SITE_OTHER): Payer: Self-pay | Admitting: Ophthalmology

## 2024-02-27 DIAGNOSIS — H3322 Serous retinal detachment, left eye: Secondary | ICD-10-CM | POA: Diagnosis not present

## 2024-02-27 DIAGNOSIS — H35033 Hypertensive retinopathy, bilateral: Secondary | ICD-10-CM

## 2024-02-27 DIAGNOSIS — E119 Type 2 diabetes mellitus without complications: Secondary | ICD-10-CM

## 2024-02-27 DIAGNOSIS — H25811 Combined forms of age-related cataract, right eye: Secondary | ICD-10-CM

## 2024-02-27 DIAGNOSIS — H16232 Neurotrophic keratoconjunctivitis, left eye: Secondary | ICD-10-CM

## 2024-02-27 DIAGNOSIS — I10 Essential (primary) hypertension: Secondary | ICD-10-CM

## 2024-02-27 DIAGNOSIS — H35052 Retinal neovascularization, unspecified, left eye: Secondary | ICD-10-CM | POA: Diagnosis not present

## 2024-02-27 DIAGNOSIS — Z961 Presence of intraocular lens: Secondary | ICD-10-CM

## 2024-02-27 DIAGNOSIS — H353221 Exudative age-related macular degeneration, left eye, with active choroidal neovascularization: Secondary | ICD-10-CM | POA: Diagnosis not present

## 2024-02-27 DIAGNOSIS — H33321 Round hole, right eye: Secondary | ICD-10-CM | POA: Diagnosis not present

## 2024-02-27 DIAGNOSIS — H35411 Lattice degeneration of retina, right eye: Secondary | ICD-10-CM

## 2024-02-27 DIAGNOSIS — H3522 Other non-diabetic proliferative retinopathy, left eye: Secondary | ICD-10-CM

## 2024-02-27 DIAGNOSIS — H1820 Unspecified corneal edema: Secondary | ICD-10-CM

## 2024-02-27 MED ORDER — AFLIBERCEPT 2MG/0.05ML IZ SOLN FOR KALEIDOSCOPE
2.0000 mg | INTRAVITREAL | Status: AC | PRN
  Administered 2024-02-27: 2 mg via INTRAVITREAL

## 2024-03-05 ENCOUNTER — Emergency Department (HOSPITAL_COMMUNITY)

## 2024-03-05 ENCOUNTER — Encounter (HOSPITAL_COMMUNITY): Payer: Self-pay | Admitting: *Deleted

## 2024-03-05 ENCOUNTER — Other Ambulatory Visit: Payer: Self-pay

## 2024-03-05 ENCOUNTER — Inpatient Hospital Stay (HOSPITAL_COMMUNITY)
Admission: EM | Admit: 2024-03-05 | Discharge: 2024-03-06 | DRG: 282 | Disposition: A | Attending: Internal Medicine | Admitting: Internal Medicine

## 2024-03-05 ENCOUNTER — Encounter (HOSPITAL_COMMUNITY): Payer: Self-pay

## 2024-03-05 ENCOUNTER — Ambulatory Visit (HOSPITAL_COMMUNITY)
Admission: EM | Admit: 2024-03-05 | Discharge: 2024-03-05 | Disposition: A | Discharge status: Home / Self Care | Attending: Family Medicine | Admitting: Family Medicine

## 2024-03-05 DIAGNOSIS — R072 Precordial pain: Secondary | ICD-10-CM | POA: Diagnosis not present

## 2024-03-05 DIAGNOSIS — I493 Ventricular premature depolarization: Secondary | ICD-10-CM | POA: Diagnosis not present

## 2024-03-05 DIAGNOSIS — E119 Type 2 diabetes mellitus without complications: Secondary | ICD-10-CM

## 2024-03-05 DIAGNOSIS — R079 Chest pain, unspecified: Secondary | ICD-10-CM | POA: Diagnosis not present

## 2024-03-05 DIAGNOSIS — I214 Non-ST elevation (NSTEMI) myocardial infarction: Secondary | ICD-10-CM | POA: Diagnosis not present

## 2024-03-05 DIAGNOSIS — E785 Hyperlipidemia, unspecified: Secondary | ICD-10-CM

## 2024-03-05 DIAGNOSIS — I2510 Atherosclerotic heart disease of native coronary artery without angina pectoris: Secondary | ICD-10-CM | POA: Diagnosis not present

## 2024-03-05 DIAGNOSIS — I739 Peripheral vascular disease, unspecified: Secondary | ICD-10-CM | POA: Insufficient documentation

## 2024-03-05 LAB — CBC WITH DIFFERENTIAL/PLATELET
Abs Immature Granulocytes: 0.02 K/uL (ref 0.00–0.07)
Basophils Absolute: 0.1 K/uL (ref 0.0–0.1)
Basophils Relative: 1 %
Eosinophils Absolute: 0.2 K/uL (ref 0.0–0.5)
Eosinophils Relative: 4 %
HCT: 42.5 % (ref 39.0–52.0)
Hemoglobin: 13.6 g/dL (ref 13.0–17.0)
Immature Granulocytes: 0 %
Lymphocytes Relative: 17 %
Lymphs Abs: 0.8 K/uL (ref 0.7–4.0)
MCH: 24.2 pg — ABNORMAL LOW (ref 26.0–34.0)
MCHC: 32 g/dL (ref 30.0–36.0)
MCV: 75.6 fL — ABNORMAL LOW (ref 80.0–100.0)
Monocytes Absolute: 0.8 K/uL (ref 0.1–1.0)
Monocytes Relative: 16 %
Neutro Abs: 3 K/uL (ref 1.7–7.7)
Neutrophils Relative %: 62 %
Platelets: 275 K/uL (ref 150–400)
RBC: 5.62 MIL/uL (ref 4.22–5.81)
RDW: 15.3 % (ref 11.5–15.5)
WBC: 4.9 K/uL (ref 4.0–10.5)
nRBC: 0 % (ref 0.0–0.2)

## 2024-03-05 LAB — COMPREHENSIVE METABOLIC PANEL WITH GFR
ALT: 31 U/L (ref 0–44)
AST: 35 U/L (ref 15–41)
Albumin: 3.6 g/dL (ref 3.5–5.0)
Alkaline Phosphatase: 60 U/L (ref 38–126)
Anion gap: 5 (ref 5–15)
BUN: 11 mg/dL (ref 8–23)
CO2: 25 mmol/L (ref 22–32)
Calcium: 8.8 mg/dL — ABNORMAL LOW (ref 8.9–10.3)
Chloride: 106 mmol/L (ref 98–111)
Creatinine, Ser: 1.02 mg/dL (ref 0.61–1.24)
GFR, Estimated: >60 mL/min (ref >60)
Glucose, Bld: 204 mg/dL — ABNORMAL HIGH (ref 70–99)
Potassium: 4.2 mmol/L (ref 3.5–5.1)
Sodium: 136 mmol/L (ref 135–145)
Total Bilirubin: 0.5 mg/dL (ref 0.0–1.2)
Total Protein: 6.8 g/dL (ref 6.5–8.1)

## 2024-03-05 LAB — TROPONIN I (HIGH SENSITIVITY)
Troponin I (High Sensitivity): 1453 ng/L (ref <18)
Troponin I (High Sensitivity): 1319 ng/L (ref <18)

## 2024-03-05 LAB — SEDIMENTATION RATE: Sed Rate: 5 mm/h (ref 0–16)

## 2024-03-05 LAB — C-REACTIVE PROTEIN: CRP: 0.7 mg/dL (ref <1.0)

## 2024-03-05 MED ORDER — ASPIRIN 81 MG PO TBEC
81.0000 mg | DELAYED_RELEASE_TABLET | Freq: Every day | ORAL | Status: DC
  Administered 2024-03-06: 81 mg via ORAL
  Filled 2024-03-05: qty 1

## 2024-03-05 MED ORDER — ONDANSETRON HCL 4 MG/2ML IJ SOLN: 4.0000 mg | Freq: Four times a day (QID) | INTRAMUSCULAR | Status: DC | PRN

## 2024-03-05 MED ORDER — PANTOPRAZOLE SODIUM 40 MG PO TBEC
40.0000 mg | DELAYED_RELEASE_TABLET | Freq: Every day | ORAL | Status: DC
  Administered 2024-03-05 – 2024-03-06 (×2): 40 mg via ORAL
  Filled 2024-03-05 (×2): qty 1

## 2024-03-05 MED ORDER — EMPAGLIFLOZIN 25 MG PO TABS: 25.0000 mg | ORAL_TABLET | Freq: Every day | ORAL | Status: DC

## 2024-03-05 MED ORDER — KETOROLAC TROMETHAMINE 15 MG/ML IJ SOLN
15.0000 mg | Freq: Once | INTRAMUSCULAR | Status: AC
  Administered 2024-03-05: 15 mg via INTRAVENOUS
  Filled 2024-03-05: qty 1

## 2024-03-05 MED ORDER — ASPIRIN 81 MG PO CHEW
324.0000 mg | CHEWABLE_TABLET | Freq: Once | ORAL | Status: AC
  Administered 2024-03-05: 324 mg via ORAL
  Filled 2024-03-05: qty 4

## 2024-03-05 MED ORDER — EZETIMIBE 10 MG PO TABS
10.0000 mg | ORAL_TABLET | Freq: Every day | ORAL | Status: DC
  Administered 2024-03-05 – 2024-03-06 (×2): 10 mg via ORAL
  Filled 2024-03-05 (×2): qty 1

## 2024-03-05 MED ORDER — MORPHINE SULFATE (PF) 2 MG/ML IV SOLN
2.0000 mg | INTRAVENOUS | Status: DC | PRN
  Administered 2024-03-05: 2 mg via INTRAVENOUS
  Filled 2024-03-05: qty 1

## 2024-03-05 MED ORDER — LISINOPRIL 10 MG PO TABS
5.0000 mg | ORAL_TABLET | Freq: Every day | ORAL | Status: DC
  Administered 2024-03-06: 5 mg via ORAL
  Filled 2024-03-05: qty 1

## 2024-03-05 MED ORDER — NITROGLYCERIN 0.4 MG SL SUBL: 0.4000 mg | SUBLINGUAL_TABLET | SUBLINGUAL | Status: DC | PRN

## 2024-03-05 MED ORDER — NITROGLYCERIN 0.4 MG SL SUBL
0.4000 mg | SUBLINGUAL_TABLET | SUBLINGUAL | Status: AC | PRN
  Administered 2024-03-05 (×3): 0.4 mg via SUBLINGUAL
  Filled 2024-03-05 (×3): qty 1

## 2024-03-05 MED ORDER — METOPROLOL SUCCINATE ER 25 MG PO TB24
75.0000 mg | ORAL_TABLET | Freq: Every day | ORAL | Status: DC
  Administered 2024-03-06: 75 mg via ORAL
  Filled 2024-03-05: qty 3

## 2024-03-05 MED ORDER — NITROGLYCERIN IN D5W 200-5 MCG/ML-% IV SOLN
0.0000 ug/min | INTRAVENOUS | Status: DC
  Administered 2024-03-05: 5 ug/min via INTRAVENOUS
  Filled 2024-03-05: qty 250

## 2024-03-05 MED ORDER — HEPARIN (PORCINE) 25000 UT/250ML-% IV SOLN
1300.0000 [IU]/h | INTRAVENOUS | Status: DC
  Administered 2024-03-05: 1300 [IU]/h via INTRAVENOUS
  Filled 2024-03-05 (×2): qty 250

## 2024-03-05 MED ORDER — ACETAMINOPHEN 325 MG PO TABS
650.0000 mg | ORAL_TABLET | Freq: Four times a day (QID) | ORAL | Status: DC | PRN
  Administered 2024-03-05 – 2024-03-06 (×2): 650 mg via ORAL
  Filled 2024-03-05 (×2): qty 2

## 2024-03-05 MED ORDER — ROSUVASTATIN CALCIUM 20 MG PO TABS
40.0000 mg | ORAL_TABLET | Freq: Every day | ORAL | Status: DC
  Administered 2024-03-05 – 2024-03-06 (×2): 40 mg via ORAL
  Filled 2024-03-05 (×2): qty 2

## 2024-03-05 NOTE — ED Notes (Signed)
 Pt discussed his concerns about cardiac cath procedure in morning. Pt states he has pain with touch, movement, and deep breaths. Pt states when still his pain is a 1. RN informed pt echocardiogram was ordered and he can discuss further concerns with cardiologist in morning.

## 2024-03-05 NOTE — ED Notes (Signed)
 Tray was requested and should arrive by 19:11

## 2024-03-05 NOTE — ED Notes (Signed)
Pt provided a beverage.

## 2024-03-05 NOTE — Progress Notes (Signed)
 Note: Avoid right radial approach if future cath is required

## 2024-03-05 NOTE — ED Triage Notes (Signed)
 Pt c/o lt side chest pain on movement and taking a deep breath. States started after lifting heavy bags of leaves yesterday.  Denies SOB or diaphoresis. States took tylenol  last night with relief.

## 2024-03-05 NOTE — Discharge Instructions (Signed)
 He will go to the emergency room for further evaluation and treatment.
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
## 2024-03-05 NOTE — ED Provider Notes (Signed)
 Care assumed from Dr. Evalyn.  At time of transfer of care, we are consulted cardiology for suspected NSTEMI with troponin of 1400 initially.  Patient had history of MI in the past and has multiple stents.  Patient started having an aching muscle like chest discomfort in his left chest yesterday while doing yard work.  Otherwise denies significant leg pain, leg swelling, travel, nausea, vomiting, diaphoresis, shortness of breath.  He is having mild discomfort now is about 3 out of 10 and will get some nitro.  Anticipate admission to cardiology for further management.  Patient given aspirin  and nitro and cardiology was called.  They will see for admission.   Clinical Impression: 1. NSTEMI (non-ST elevated myocardial infarction) [facility])     Disposition: Admit  This note was prepared with assistance of Dragon voice recognition software. Occasional wrong-word or sound-a-like substitutions may have occurred due to the inherent limitations of voice recognition software.     Helayne Metsker, Lonni PARAS, MD 03/05/24 1710

## 2024-03-05 NOTE — ED Notes (Signed)
 EDP at [facility]. Daughter at [facility].

## 2024-03-05 NOTE — ED Notes (Signed)
 Patient is being discharged from the Urgent Care and sent to the Emergency Department via POV . Per Dr. Vonna, patient is in need of higher level of care due to Chest pain. Patient is aware and verbalizes understanding of plan of care.  Vitals:   03/05/24 1244  BP: 134/87  Pulse: 94  Resp: 18  Temp: 98.3 F (36.8 C)  SpO2: 97%
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
## 2024-03-05 NOTE — ED Triage Notes (Signed)
 Here by POV from home for CP. Seen at UC this am (see note). H/o STEMI. Comparison EKG in hand. Sent from UC. Alert, NAD, calm, interactive, resps e/u, speaking in clear complete sentences. Steady gait.

## 2024-03-06 ENCOUNTER — Encounter (HOSPITAL_COMMUNITY): Payer: Self-pay | Admitting: Cardiology

## 2024-03-06 ENCOUNTER — Inpatient Hospital Stay (HOSPITAL_COMMUNITY)
Admission: EM | Disposition: A | Discharge status: Home / Self Care | Payer: Self-pay | Source: Home / Self Care | Attending: Internal Medicine

## 2024-03-06 ENCOUNTER — Other Ambulatory Visit (HOSPITAL_COMMUNITY): Payer: Self-pay

## 2024-03-06 ENCOUNTER — Inpatient Hospital Stay (HOSPITAL_COMMUNITY)

## 2024-03-06 DIAGNOSIS — E785 Hyperlipidemia, unspecified: Secondary | ICD-10-CM | POA: Diagnosis not present

## 2024-03-06 DIAGNOSIS — I739 Peripheral vascular disease, unspecified: Secondary | ICD-10-CM | POA: Insufficient documentation

## 2024-03-06 DIAGNOSIS — I493 Ventricular premature depolarization: Secondary | ICD-10-CM | POA: Diagnosis not present

## 2024-03-06 DIAGNOSIS — I214 Non-ST elevation (NSTEMI) myocardial infarction: Secondary | ICD-10-CM | POA: Diagnosis not present

## 2024-03-06 DIAGNOSIS — I2510 Atherosclerotic heart disease of native coronary artery without angina pectoris: Secondary | ICD-10-CM | POA: Diagnosis not present

## 2024-03-06 HISTORY — PX: LEFT HEART CATH AND CORONARY ANGIOGRAPHY: CATH118249

## 2024-03-06 LAB — BASIC METABOLIC PANEL WITH GFR
Anion gap: 13 (ref 5–15)
BUN: 10 mg/dL (ref 8–23)
CO2: 24 mmol/L (ref 22–32)
Calcium: 8.6 mg/dL — ABNORMAL LOW (ref 8.9–10.3)
Chloride: 101 mmol/L (ref 98–111)
Creatinine, Ser: 1.23 mg/dL (ref 0.61–1.24)
GFR, Estimated: >60 mL/min (ref >60)
Glucose, Bld: 141 mg/dL — ABNORMAL HIGH (ref 70–99)
Potassium: 3.8 mmol/L (ref 3.5–5.1)
Sodium: 138 mmol/L (ref 135–145)

## 2024-03-06 LAB — ECHOCARDIOGRAM COMPLETE
Area-P 1/2: 5.02 cm2
Calc EF: 50.6 %
Height: 74 in
MV M vel: 4.38 m/s
MV Peak grad: 76.7 mmHg
P 1/2 time: 435 ms
S' Lateral: 3.7 cm
Single Plane A2C EF: 49.3 %
Single Plane A4C EF: 50.6 %
Weight: 3536 [oz_av]

## 2024-03-06 LAB — LIPID PANEL
Cholesterol: 162 mg/dL (ref 0–200)
HDL: 40 mg/dL — ABNORMAL LOW (ref >40)
LDL Cholesterol: 59 mg/dL (ref 0–99)
Total CHOL/HDL Ratio: 4.1 ratio
Triglycerides: 314 mg/dL — ABNORMAL HIGH (ref <150)
VLDL: 63 mg/dL — ABNORMAL HIGH (ref 0–40)

## 2024-03-06 LAB — APTT: aPTT: 90 s — ABNORMAL HIGH (ref 24–36)

## 2024-03-06 LAB — HEPARIN LEVEL (UNFRACTIONATED): Heparin Unfractionated: 1.02 [IU]/mL — ABNORMAL HIGH (ref 0.30–0.70)

## 2024-03-06 SURGERY — LEFT HEART CATH AND CORONARY ANGIOGRAPHY
Anesthesia: LOCAL

## 2024-03-06 MED ORDER — SODIUM CHLORIDE 0.9% FLUSH: 3.0000 mL | Freq: Two times a day (BID) | INTRAVENOUS | Status: DC

## 2024-03-06 MED ORDER — FENTANYL CITRATE (PF) 100 MCG/2ML IJ SOLN
INTRAMUSCULAR | Status: DC | PRN
  Administered 2024-03-06: 50 ug via INTRAVENOUS

## 2024-03-06 MED ORDER — IOHEXOL 350 MG/ML SOLN
INTRAVENOUS | Status: DC | PRN
  Administered 2024-03-06: 40 mL

## 2024-03-06 MED ORDER — VERAPAMIL HCL 2.5 MG/ML IV SOLN
INTRAVENOUS | Status: AC
  Filled 2024-03-06: qty 2

## 2024-03-06 MED ORDER — HEPARIN SODIUM (PORCINE) 1000 UNIT/ML IJ SOLN
INTRAMUSCULAR | Status: DC | PRN
  Administered 2024-03-06: 5000 [IU] via INTRAVENOUS

## 2024-03-06 MED ORDER — HEPARIN SODIUM (PORCINE) 1000 UNIT/ML IJ SOLN
INTRAMUSCULAR | Status: AC
  Filled 2024-03-06: qty 10

## 2024-03-06 MED ORDER — MIDAZOLAM HCL 2 MG/2ML IJ SOLN
INTRAMUSCULAR | Status: AC
  Filled 2024-03-06: qty 2

## 2024-03-06 MED ORDER — SODIUM CHLORIDE 0.9 % IV SOLN: 250.0000 mL | INTRAVENOUS | Status: DC | PRN

## 2024-03-06 MED ORDER — CLOPIDOGREL BISULFATE 75 MG PO TABS: 75.0000 mg | ORAL_TABLET | Freq: Every day | ORAL | Status: DC

## 2024-03-06 MED ORDER — CLOPIDOGREL BISULFATE 300 MG PO TABS
ORAL_TABLET | ORAL | Status: AC
  Filled 2024-03-06: qty 1

## 2024-03-06 MED ORDER — HEPARIN (PORCINE) IN NACL 1000-0.9 UT/500ML-% IV SOLN
INTRAVENOUS | Status: DC | PRN
  Administered 2024-03-06 (×2): 500 mL

## 2024-03-06 MED ORDER — LIDOCAINE HCL (PF) 1 % IJ SOLN
INTRAMUSCULAR | Status: DC | PRN
  Administered 2024-03-06: 5 mL via INTRADERMAL

## 2024-03-06 MED ORDER — FENTANYL CITRATE (PF) 100 MCG/2ML IJ SOLN
INTRAMUSCULAR | Status: AC
  Filled 2024-03-06: qty 2

## 2024-03-06 MED ORDER — FREE WATER: 250.0000 mL | Freq: Once | Status: DC

## 2024-03-06 MED ORDER — FREE WATER: 500.0000 mL | Freq: Once | Status: DC

## 2024-03-06 MED ORDER — CLOPIDOGREL BISULFATE 75 MG PO TABS
75.0000 mg | ORAL_TABLET | Freq: Every day | ORAL | 2 refills | Status: AC
  Filled 2024-03-06: qty 90, 90d supply, fill #0

## 2024-03-06 MED ORDER — MIDAZOLAM HCL (PF) 2 MG/2ML IJ SOLN
INTRAMUSCULAR | Status: DC | PRN
  Administered 2024-03-06: 1 mg via INTRAVENOUS

## 2024-03-06 MED ORDER — VERAPAMIL HCL 2.5 MG/ML IV SOLN
INTRAVENOUS | Status: DC | PRN
  Administered 2024-03-06: 10 mL via INTRA_ARTERIAL

## 2024-03-06 MED ORDER — SODIUM CHLORIDE 0.9% FLUSH: 3.0000 mL | INTRAVENOUS | Status: DC | PRN

## 2024-03-06 MED ORDER — LIDOCAINE HCL (PF) 1 % IJ SOLN
INTRAMUSCULAR | Status: AC
  Filled 2024-03-06: qty 30

## 2024-03-06 SURGICAL SUPPLY — 10 items
CATH INFINITI AMBI 5FR TG (CATHETERS) IMPLANT
CATH INFINITI JR4 5F (CATHETERS) IMPLANT
DEVICE RAD TR BAND REGULAR (VASCULAR PRODUCTS) IMPLANT
GLIDESHEATH SLEND SS 6F .021 (SHEATH) IMPLANT
GUIDEWIRE INQWIRE 1.5J.035X260 (WIRE) IMPLANT
PACK CARDIAC CATHETERIZATION (CUSTOM PROCEDURE TRAY) ×1 IMPLANT
SET ATX-X65L (MISCELLANEOUS) IMPLANT
SHEATH PINNACLE 5F 10CM (SHEATH) IMPLANT
WIRE EMERALD 3MM-J .035X260CM (WIRE) IMPLANT
WIRE MICRO SET 5FR 12 (WIRE) IMPLANT

## 2024-03-06 NOTE — Discharge Summary (Addendum)
 Discharge Summary   Patient ID: Derrick Todd MRN: [id number]; DOB: [birth year]  Admit date: 03/05/2024 Discharge date: 03/06/2024  PCP:  [name] DASEN, MD   [facility] Providers Cardiologist:  Ezra Shuck, MD   Discharge Diagnoses  Principal Problem:   NSTEMI (non-ST elevated myocardial infarction) [facility]) Active Problems:   Hyperlipidemia   DM2 (diabetes mellitus, type 2) (HCC)   PAD (peripheral artery disease)  Diagnostic Studies/Procedures   Echo: 03/06/2024  IMPRESSIONS    1. Left ventricular ejection fraction, by estimation, is 55 to 60%. The  left ventricle has normal function. The left ventricle has no regional  wall motion abnormalities. Left ventricular diastolic parameters were  normal. The average left ventricular  global longitudinal strain is -13.4 %. The global longitudinal strain is  abnormal.   2. Right ventricular systolic function is normal. The right ventricular  size is normal.   3. Left atrial size was mildly dilated.   4. The mitral valve is normal in structure. No evidence of mitral valve  regurgitation. No evidence of mitral stenosis.   5. The aortic valve is normal in structure. There is mild calcification  of the aortic valve. Aortic valve regurgitation is mild. No aortic  stenosis is present.   6. The inferior vena cava is normal in size with greater than 50%  respiratory variability, suggesting right atrial pressure of 3 mmHg.   Comparison(s): No significant change from prior study.   FINDINGS   Left Ventricle: Left ventricular ejection fraction, by estimation, is 55  to 60%. The left ventricle has normal function. The left ventricle has no  regional wall motion abnormalities. The average left ventricular global  longitudinal strain is -13.4 %.  Strain was performed and the global longitudinal strain is abnormal. The  left ventricular internal cavity size was normal in size. There is no left  ventricular hypertrophy. Left  ventricular diastolic parameters were  normal.   Right Ventricle: The right ventricular size is normal. No increase in  right ventricular wall thickness. Right ventricular systolic function is  normal.   Left Atrium: Left atrial size was mildly dilated.   Right Atrium: Right atrial size was normal in size.   Pericardium: There is no evidence of pericardial effusion.   Mitral Valve: The mitral valve is normal in structure. No evidence of  mitral valve regurgitation. No evidence of mitral valve stenosis.   Tricuspid Valve: The tricuspid valve is normal in structure. Tricuspid  valve regurgitation is not demonstrated. No evidence of tricuspid  stenosis.   Aortic Valve: The aortic valve is normal in structure. There is mild  calcification of the aortic valve. Aortic valve regurgitation is mild.  Aortic regurgitation PHT measures 435 msec. No aortic stenosis is present.   Pulmonic Valve: The pulmonic valve was normal in structure. Pulmonic valve  regurgitation is not visualized. No evidence of pulmonic stenosis.   Aorta: The aortic root is normal in size and structure.   Venous: The inferior vena cava is normal in size with greater than 50%  respiratory variability, suggesting right atrial pressure of 3 mmHg.   IAS/Shunts: No atrial level shunt detected by color flow Doppler.   Cath: 03/06/2024    Dist RCA lesion is 30% stenosed.   Dist Cx lesion is 95% stenosed.   Mid LAD lesion is 40% stenosed.   Non-stenotic Mid RCA lesion was previously treated.   1.  Patent LAD and RCA stents.  There is focal 40% in-stent restenosis in the LAD that  does not appear to be significant.  Progression of distal left circumflex disease to 95% with hazy appearance.  This is the likely culprit for non-STEMI.  However, vessel diameter is small and supplied myocardium is very small in size. 2.  Left ventricular angiography was not performed.  EF was normal by echo.  Normal left ventricular end-diastolic  pressure.   Recommendations: Risks of PCI on the distal left circumflex outweigh benefits.  Recommend medical therapy. Recommend adding clopidogrel  for 12 months. Xarelto  2.5 mg twice daily can be resumed tomorrow morning.  Diagnostic Dominance: Right  _____________   History of Present Illness   Derrick Todd is a 63 y.o. male with CAD, PAD, history of spontaneous left retinal detachment, hyperlipidemia and DM2 who was seen 03/05/2024 for the evaluation of chest pain with elevated troponin. He works as a engineer, civil (consulting) in the emergency room.  He has been followed by Dr. Rolan.  He had NSTEMI in May 2013 on the underwent a BMS to totally occluded distal RCA and inferior STEMI in December 2014 secondary to a 95% mid RCA lesion (distal RCA stent patent), this was treated with a second bare-metal stent.  Echocardiogram in December 2014 showed EF 50 to 55%.  He quit smoking in July 2014.  He had unstable angina in December 2017 and underwent DES to 90% mid LAD.  He had 80% distal left circumflex lesion that was in the small vessel and that was managed medically.  EF was 50 to 55% the time.  Myoview  in October 2022 showed EF of 48%, inferior infarct with mild peri-infarct ischemia.  She had left retinal detachment and required multiple procedures to treat.  He is on low-dose 2.5 mg of Xarelto  at home.  Patient was last seen by Dr. Rolan in November 2024 at which time he was doing well.  Echocardiogram obtained on 04/12/2023 showed EF 60 to 65%, no regional wall motion abnormality, mild AI.  He currently works as a engineer, civil (consulting) 4 days a week.   He was cleaning his backyard, raking leaves and carrying a bale of leaves the afternoon prior to admission when he started having a left-sided twinge under the left breast.  Symptoms worsened overnight prompting patient to seek urgent medical attention at urgent care.  He was subsequently transferred to [facility] ED for further evaluation.  Chest pain felt different from the  previous angina and had a pleuritic nature.  He had low-grade chest discomfort that was constant since last night, however every time he would take a deep breath or cough, chest pain increases from 2 out of 10 to 8 out of 10.  Serial troponin was elevated at 1400.  Last dose of 2.5 mg Xarelto  was this morning.  Chest x-ray was negative for acute process.  Initial EKG showed sinus rhythm with Q wave and T wave inversion in the inferior lead, however repeat EKG a hour later showed improvement in the T wave inversion and Q wave in the inferior leads. HsTn 1453, given this finding he was admitted with plans for cardiac cath.   Close outpatient follow up will be arranged with general cardiology. Will need to follow up with [facility] in the future.    Hospital Course    NSTEMI CAD s/p BMS x 2 to RCA in 2013 and 2014, bare-metal stent to LAD in 2017 -- hsTn 1453>>1319 -- EKG showed T wave inversion and Q wave in the inferior lead -- underwent cardiac cath 12/3 with patent LAD and RCA stents  with progression of dLcx of 95% felt to be culprit lesion. However given small vessel diameter and small supply of myocardium this was recommended for medical therapy.  -- given plavix  300mg  load, plan to start plavix  75mg  daily tomorrow  PAD -- On low-dose 2.5 mg Xarelto  at home, resume 12/4   History of left eye retinal detachment -- Followed by Dr. Valdemar as outpatient.   Hyperlipidemia -- LDL 63, HDL 40 -- Continue rosuvastatin , zetia    DM 2 -- On Jardiance , glipizide , januvia  PTA   Did the patient have an acute coronary syndrome (MI, NSTEMI, STEMI, etc) this admission?:  Yes                               AHA/ACC ACS Clinical Performance & Quality Measures: Aspirin  prescribed? - No - on Xarelto  ADP Receptor Inhibitor (Plavix /Clopidogrel , Brilinta /Ticagrelor  or Effient /Prasugrel ) prescribed (includes medically managed patients)? - Yes Beta Blocker prescribed? - No. The patient has an EF >/= 50%. Based  upon the [facility] Study pub in Apr 2024, there is no benefit in patients with preserved EF post MI. High Intensity Statin (Lipitor  40-80mg  or Crestor  20-40mg ) prescribed? - Yes EF assessed during THIS hospitalization? - Yes For EF <40%, was ACEI/ARB prescribed? - Not Applicable (EF >/= 40%) For EF <40%, Aldosterone Antagonist (Spironolactone or Eplerenone) prescribed? - Not Applicable (EF >/= 40%) Cardiac Rehab Phase II ordered (including medically managed patients)? - Yes    _____________  Discharge Vitals Blood pressure (!) 150/79, pulse 77, temperature 97.9 F (36.6 C), temperature source Oral, resp. rate 14, height 6' 2 (1.88 m), weight 100.2 kg, SpO2 93%.  Filed Weights   03/05/24 1357  Weight: 100.2 kg    Labs & Radiologic Studies  CBC Recent Labs    03/05/24 1405  WBC 4.9  NEUTROABS 3.0  HGB 13.6  HCT 42.5  MCV 75.6*  PLT 275   Basic Metabolic Panel Recent Labs    87/97/74 1405 03/06/24 0236  NA 136 138  K 4.2 3.8  CL 106 101  CO2 25 24  GLUCOSE 204* 141*  BUN 11 10  CREATININE 1.02 1.23  CALCIUM  8.8* 8.6*   Liver Function Tests Recent Labs    03/05/24 1405  AST 35  ALT 31  ALKPHOS 60  BILITOT 0.5  PROT 6.8  ALBUMIN 3.6   No results for input(s): LIPASE, AMYLASE in the last 72 hours. High Sensitivity Troponin:   Recent Labs  Lab 03/05/24 1405 03/05/24 1542  TROPONINIHS 1,453* 1,319*    No results for input(s): TRNPT in the last 720 hours.  BNP Invalid input(s): POCBNP No results for input(s): PROBNP in the last 72 hours.  No results for input(s): BNP in the last 72 hours.  D-Dimer No results for input(s): DDIMER in the last 72 hours. Hemoglobin A1C No results for input(s): HGBA1C in the last 72 hours. Fasting Lipid Panel Recent Labs    03/06/24 0236  CHOL 162  HDL 40*  LDLCALC 59  TRIG 685*  CHOLHDL 4.1   Lipoprotein (a)  Date/Time Value Ref Range Status  02/23/2023 09:33 AM 55.0 (H) <75.0 nmol/L Final     Comment:    (NOTE) Note:  Values greater than or equal to 75.0 nmol/L may       indicate an independent risk factor for CHD,       but must be evaluated with caution when applied       to  non-Caucasian populations due to the       influence of genetic factors on Lp(a) across       ethnicities. Performed At: [facility] [address] [place], [place] [id number] Jennette Shorter MD Ey:[id number]     Thyroid Function Tests No results for input(s): TSH, T4TOTAL, T3FREE, THYROIDAB in the last 72 hours.  Invalid input(s): FREET3 _____________  CARDIAC CATHETERIZATION Result Date: 03/06/2024   Dist RCA lesion is 30% stenosed.   Dist Cx lesion is 95% stenosed.   Mid LAD lesion is 40% stenosed.   Non-stenotic Mid RCA lesion was previously treated. 1.  Patent LAD and RCA stents.  There is focal 40% in-stent restenosis in the LAD that does not appear to be significant.  Progression of distal left circumflex disease to 95% with hazy appearance.  This is the likely culprit for non-STEMI.  However, vessel diameter is small and supplied myocardium is very small in size. 2.  Left ventricular angiography was not performed.  EF was normal by echo.  Normal left ventricular end-diastolic pressure. Recommendations: Risks of PCI on the distal left circumflex outweigh benefits.  Recommend medical therapy. Recommend adding clopidogrel  for 12 months. Xarelto  2.5 mg twice daily can be resumed tomorrow morning.   ECHOCARDIOGRAM COMPLETE Result Date: 03/06/2024    ECHOCARDIOGRAM REPORT   Patient Name:   Derrick Todd Date of Exam: 03/06/2024 Medical Rec #:  [id number]    Height:       74.0 in Accession #:    [id number]   Weight:       221.0 lb Date of Birth:  07-24-1960   BSA:          2.268 m Patient Age:    63 years     BP:           120/58 mmHg Patient Gender: M            HR:           70 bpm. Exam Location:  Inpatient Procedure: 2D Echo (Both Spectral and Color Flow Doppler were utilized during             procedure). Indications:    chest pain  History:        Patient has prior history of Echocardiogram examinations, most                 recent 04/12/2023. CHF, CAD; Risk Factors:Dyslipidemia and                 Diabetes.  Sonographer:    Tinnie Barefoot RDCS Referring Phys: [id number] HAO MENG  Sonographer Comments: Global longitudinal strain was attempted. IMPRESSIONS  1. Left ventricular ejection fraction, by estimation, is 55 to 60%. The left ventricle has normal function. The left ventricle has no regional wall motion abnormalities. Left ventricular diastolic parameters were normal. The average left ventricular global longitudinal strain is -13.4 %. The global longitudinal strain is abnormal.  2. Right ventricular systolic function is normal. The right ventricular size is normal.  3. Left atrial size was mildly dilated.  4. The mitral valve is normal in structure. No evidence of mitral valve regurgitation. No evidence of mitral stenosis.  5. The aortic valve is normal in structure. There is mild calcification of the aortic valve. Aortic valve regurgitation is mild. No aortic stenosis is present.  6. The inferior vena cava is normal in size with greater than 50% respiratory variability, suggesting right atrial pressure of 3 mmHg. Comparison(s): No significant change  from prior study. FINDINGS  Left Ventricle: Left ventricular ejection fraction, by estimation, is 55 to 60%. The left ventricle has normal function. The left ventricle has no regional wall motion abnormalities. The average left ventricular global longitudinal strain is -13.4 %. Strain was performed and the global longitudinal strain is abnormal. The left ventricular internal cavity size was normal in size. There is no left ventricular hypertrophy. Left ventricular diastolic parameters were normal. Right Ventricle: The right ventricular size is normal. No increase in right ventricular wall thickness. Right ventricular systolic function is normal. Left  Atrium: Left atrial size was mildly dilated. Right Atrium: Right atrial size was normal in size. Pericardium: There is no evidence of pericardial effusion. Mitral Valve: The mitral valve is normal in structure. No evidence of mitral valve regurgitation. No evidence of mitral valve stenosis. Tricuspid Valve: The tricuspid valve is normal in structure. Tricuspid valve regurgitation is not demonstrated. No evidence of tricuspid stenosis. Aortic Valve: The aortic valve is normal in structure. There is mild calcification of the aortic valve. Aortic valve regurgitation is mild. Aortic regurgitation PHT measures 435 msec. No aortic stenosis is present. Pulmonic Valve: The pulmonic valve was normal in structure. Pulmonic valve regurgitation is not visualized. No evidence of pulmonic stenosis. Aorta: The aortic root is normal in size and structure. Venous: The inferior vena cava is normal in size with greater than 50% respiratory variability, suggesting right atrial pressure of 3 mmHg. IAS/Shunts: No atrial level shunt detected by color flow Doppler.  LEFT VENTRICLE PLAX 2D LVIDd:         4.70 cm      Diastology LVIDs:         3.70 cm      LV e' medial:    7.18 cm/s LV PW:         1.20 cm      LV E/e' medial:  8.2 LV IVS:        1.20 cm      LV e' lateral:   9.25 cm/s LVOT diam:     2.30 cm      LV E/e' lateral: 6.4 LVOT Area:     4.15 cm LV IVRT:       123 msec     2D Longitudinal Strain                             2D Strain GLS (A4C):   -12.6 %                             2D Strain GLS (A3C):   -12.9 % LV Volumes (MOD)            2D Strain GLS (A2C):   -14.8 % LV vol d, MOD A2C: 99.4 ml  2D Strain GLS Avg:     -13.4 % LV vol d, MOD A4C: 108.0 ml LV vol s, MOD A2C: 50.4 ml LV vol s, MOD A4C: 53.4 ml LV SV MOD A2C:     49.0 ml LV SV MOD A4C:     108.0 ml LV SV MOD BP:      52.9 ml RIGHT VENTRICLE             IVC RV Basal diam:  3.10 cm     IVC diam: 1.90 cm RV S prime:     12.40 cm/s TAPSE (M-mode): 2.4 cm LEFT ATRIUM  Index        RIGHT ATRIUM           Index LA diam:      4.50 cm 1.98 cm/m   RA Area:     16.90 cm LA Vol (A4C): 85.5 ml 37.70 ml/m  RA Volume:   47.50 ml  20.95 ml/m  AORTIC VALVE AI PHT:      435 msec  AORTA Ao Root diam: 3.30 cm Ao Asc diam:  3.60 cm MITRAL VALVE MV Area (PHT): 5.02 cm    SHUNTS MV Decel Time: 151 msec    Systemic Diam: 2.30 cm MR Peak grad: 76.7 mmHg MR Vmax:      438.00 cm/s MV E velocity: 58.90 cm/s MV A velocity: 57.50 cm/s MV E/A ratio:  1.02 Franck Azobou Tonleu Electronically signed by Joelle Ren Ny Signature Date/Time: 03/06/2024/12:55:57 PM    Final    DG Chest Portable 1 View Result Date: 03/05/2024 EXAM: 1 VIEW(S) XRAY OF THE CHEST 03/05/2024 02:41:42 PM COMPARISON: 05/22/2021 CLINICAL HISTORY: CP FINDINGS: LUNGS AND PLEURA: No focal pulmonary opacity. No pleural effusion. No pneumothorax. HEART AND MEDIASTINUM: No acute abnormality of the cardiac and mediastinal silhouettes. BONES AND SOFT TISSUES: No acute osseous abnormality. IMPRESSION: 1. No acute cardiopulmonary process. Electronically signed by: Franky Crease MD 03/05/2024 03:08 PM EST RP Workstation: [id number]   Intravitreal Injection, Pharmacologic Agent - OS - Left Eye Result Date: 02/27/2024 Time Out 02/27/2024. 2:35 PM. Confirmed correct patient, procedure, site, and patient consented. Anesthesia Topical anesthesia was used. Anesthetic medications included Lidocaine  2%, Proparacaine  0.5%. Procedure Preparation included 5% betadine to ocular surface, eyelid speculum. A supplied needle was used. Injection: 2 mg aflibercept  2 MG/0.05ML   Route: Intravitreal, Site: Left Eye   NDC: [id number], Lot: [id number], Expiration date: 03/03/2025, Waste: 0 mL Post-op Post injection exam found visual acuity of at least counting fingers. The patient tolerated the procedure well. There were no complications. The patient received written and verbal post procedure care education. Post injection medications were not  given.   OCT, Retina - OU - Both Eyes Result Date: 02/27/2024 Right Eye Quality was good. Central Foveal Thickness: 283. Progression has been stable. Findings include normal foveal contour, no IRF, no SRF, myopic contour, vitreomacular adhesion . Left Eye Quality was borderline. Central Foveal Thickness: 279. Progression has improved. Findings include no SRF, abnormal foveal contour, epiretinal membrane, intraretinal fluid, inner retinal atrophy, outer retinal atrophy (Retina attached, significant central ORA and diffuse retinal thinning, mild ERM, persistent focal IRF and edema IT midzone--improved). Notes *Images captured and stored on drive Diagnosis / Impression: OD: NFP, no IRF/SRF OS: Retina attached, significant central ORA and diffuse retinal thinning, mild ERM, persistent focal IRF and edema IT midzone--improved Clinical management: See below Abbreviations: NFP - Normal foveal profile. CME - cystoid macular edema. PED - pigment epithelial detachment. IRF - intraretinal fluid. SRF - subretinal fluid. EZ - ellipsoid zone. ERM - epiretinal membrane. ORA - outer retinal atrophy. ORT - outer retinal tubulation. SRHM - subretinal hyper-reflective material   Disposition Pt is being discharged home today in good condition per MD.  Follow-up Plans & Appointments  Future Appointments  Date Time Provider Department Center  03/19/2024  1:30 PM Parthenia Olivia HERO, PA-C [facility]  04/10/2024  8:30 AM Valdemar Rogue, MD [facility] None  06/20/2024  8:00 AM [name] DASEN, MD [facility]  12/24/2024  8:15 AM Duanne, Butler DASEN, MD [facility]    Discharge Instructions     AMB  referral to Phase II Cardiac Rehabilitation   Complete by: As directed    Diagnosis: NSTEMI   After initial evaluation and assessments completed: Virtual Based Care may be provided alone or in conjunction with Phase 2 Cardiac Rehab based on patient barriers.: Yes   Intensive Cardiac Rehabilitation (ICR) [facility] location only  OR Traditional Cardiac Rehabilitation (TCR) *If criteria for ICR are not met will enroll in TCR [facility] only): Yes   Call MD for:  redness, tenderness, or signs of infection (pain, swelling, redness, odor or green/yellow discharge around incision site)   Complete by: As directed    Discharge instructions   Complete by: As directed    Radial Site Care Refer to this sheet in the next few weeks. These instructions provide you with information on caring for yourself after your procedure. Your caregiver may also give you more specific instructions. Your treatment has been planned according to current medical practices, but problems sometimes occur. Call your caregiver if you have any problems or questions after your procedure. HOME CARE INSTRUCTIONS ? You may shower the day after the procedure. Remove the bandage (dressing) and gently wash the site with plain soap and water . Gently pat the site dry.  ? Do not apply powder or lotion to the site.  ? Do not submerge the affected site in water  for 3 to 5 days.  ? Inspect the site at least twice daily.  ? Do not flex or bend the affected arm for 24 hours.  ? No lifting over 5 pounds (2.3 kg) for 5 days after your procedure.  ? Do not drive home if you are discharged the same day of the procedure. Have someone else drive you.  ? You may drive 24 hours after the procedure unless otherwise instructed by your caregiver.  What to expect: ? Any bruising will usually fade within 1 to 2 weeks.  ? Blood that collects in the tissue (hematoma) may be painful to the touch. It should usually decrease in size and tenderness within 1 to 2 weeks.  SEEK IMMEDIATE MEDICAL CARE IF: ? You have unusual pain at the radial site.  ? You have redness, warmth, swelling, or pain at the radial site.  ? You have drainage (other than a small amount of blood on the dressing).  ? You have chills.  ? You have a fever or persistent symptoms for more than 72 hours.  ? You have a fever  and your symptoms suddenly get worse.  ? Your arm becomes pale, cool, tingly, or numb.  ? You have heavy bleeding from the site. Hold pressure on the site.   PLEASE DO NOT MISS ANY DOSES OF YOUR PLAVIX !!!!! Also keep a log of you blood pressures and bring back to your follow up appt. Please call the office with any questions.   Patients taking blood thinners should generally stay away from medicines like ibuprofen, Advil, Motrin, naproxen, and Aleve due to risk of stomach bleeding. You may take Tylenol  as directed or talk to your primary doctor about alternatives.  Some studies suggest Prilosec/Omeprazole interacts with Plavix . We changed your Prilosec/Omeprazole to the equivalent dose of Protonix  for less chance of interaction.  PLEASE ENSURE THAT YOU DO NOT RUN OUT OF YOUR PLAVIX . This medication is very important to remain on for at least one year. IF you have issues obtaining this medication due to cost please CALL the office 3-5 business days prior to running out in order to prevent missing doses of this  medication.       Discharge Medications Allergies as of 03/06/2024   No Known Allergies      Medication List     STOP taking these medications    aspirin  EC 81 MG tablet Commonly known as: Aspirin  Low Dose       TAKE these medications    clopidogrel  75 MG tablet Commonly known as: Plavix  Take 1 tablet (75 mg total) by mouth daily.   CoQ10 100 MG Caps Take 2 capsules (200mg  total) by mouth every evening.   ezetimibe  10 MG tablet Commonly known as: Zetia  Take 1 tablet (10 mg total) by mouth daily. What changed: when to take this   Fish Oil  1000 MG Caps Take 1 capsule (1,000 mg total) by mouth daily. What changed: when to take this   furosemide  20 MG tablet Commonly known as: LASIX  Take 1 tablet (20 mg total) by mouth as needed for fluid or swelling   glipiZIDE  10 MG 24 hr tablet Commonly known as: GLUCOTROL  XL Take 1 tablet (10 mg total) by mouth daily with  breakfast. Do Not Crush. What changed: when to take this   Januvia  100 MG tablet Generic drug: sitaGLIPtin  Take 1 tablet (100 mg total) by mouth daily. What changed: when to take this   Jardiance  25 MG Tabs tablet Generic drug: empagliflozin  Take 1 tablet (25 mg total) by mouth daily. NEEDS FOLLOW UP APPOINTMENT FOR MORE REFIILLS What changed:  when to take this additional instructions   lisinopril  5 MG tablet Commonly known as: ZESTRIL  Take 1 tablet (5 mg total) by mouth daily. NEEDS FOLLOW UP APPOINTMENT FOR MORE REFILLS What changed:  when to take this additional instructions   magnesium  oxide 400 MG tablet Commonly known as: MAG-OX Take 1 tablet (400 mg total) by mouth daily. What changed: when to take this   metFORMIN  1000 MG tablet Commonly known as: GLUCOPHAGE  Take 1 tablet (1,000 mg total) by mouth 2 (two) times daily with a meal.   metoprolol  succinate 25 MG 24 hr tablet Commonly known as: TOPROL -XL Take 3 tablets (75 mg total) by mouth daily. What changed: when to take this   pantoprazole  40 MG tablet Commonly known as: PROTONIX  Take 1 tablet (40 mg total) by mouth daily. What changed: when to take this   potassium chloride  SA 20 MEQ tablet Commonly known as: KLOR-CON  M Take 1 tablet (20 mEq total) by mouth daily as needed.   prednisoLONE  acetate 1 % ophthalmic suspension Commonly known as: PRED FORTE  Place 1 drop into the left eye 3- 4 times daily. What changed: when to take this   rivaroxaban  2.5 MG Tabs tablet Commonly known as: Xarelto  Take 1 tablet (2.5 mg total) by mouth 2 (two) times daily. NEEDS FOLLOW UP APPOINTMENT FOR MORE REFILLS   rosuvastatin  40 MG tablet Commonly known as: CRESTOR  Take 1 tablet (40 mg total) by mouth daily. NEEDS FOLLOW UP FOR MORE REFILLS What changed:  when to take this additional instructions   sildenafil  50 MG tablet Commonly known as: VIAGRA  TAKE 1 TABLET BY MOUTH DAILY AS NEEDED FOR ERECTILE DYSFUNCTION. DO  NOT TAKE NITROGLYCERIN  WITHIN 24 HOURS OF TAKING VIAGRA    sodium chloride  5 % ophthalmic solution Commonly known as: MURO 128 Place 1 drop into the left eye in the morning, at noon, and at bedtime.   Testosterone  20.25 MG/ACT (1.62%) Gel APPLY 1 PUMP DAILY AS DIRECTED       Outstanding Labs/Studies [name]  Duration of Discharge Encounter: APP  Time: 20 minutes   Signed, Waddell DELENA Donath, PA-C 03/06/2024, 4:59 PM  ATTENDING ATTESTATION:  After conducting a review of all available clinical information with the care team, interviewing the patient, and performing a physical exam, I agree with the findings and plan described in this note.   GEN: No acute distress, AO x 3 HEENT:  MMM, no JVD, no scleral icterus Cardiac: RRR, no murmurs, rubs, or gallops.  Respiratory: Clear to auscultation bilaterally. GI: Soft, nontender, non-distended  MS: No edema; No deformity. Neuro:  Nonfocal  Vasc:  L TR band in place  Patient doing well after diagnostic coronary angiography demonstrating high grade distal left circumflex which subtends a very small portion of the myocardium.  Will pursue medical management.  Continue plavix  75mg  x 6-12 months, low dose Xarelto , rosuvastatin  40mg .  I reviewed the results of the coronary angiography procedure, the management plan moving forward, the importance of medical compliance, and the importance of cardiology follow-up.  The patient understands and agrees with this plan.  APP discharge time:20 MD discharge time:24  Lurena Red, MD Pager [phone]

## 2024-03-06 NOTE — Discharge Instructions (Addendum)
 Radial Site Care Refer to this sheet in the next few weeks. These instructions provide you with information on caring for yourself after your procedure. Your caregiver may also give you more specific instructions. Your treatment has been planned according to current medical practices, but problems sometimes occur. Call your caregiver if you have any problems or questions after your procedure. HOME CARE INSTRUCTIONS You may shower the day after the procedure. Remove the bandage (dressing) and gently wash the site with plain soap and water . Gently pat the site dry.  Do not apply powder or lotion to the site.  Do not submerge the affected site in water  for 3 to 5 days.  Inspect the site at least twice daily.  Do not flex or bend the affected arm for 24 hours.  No lifting over 5 pounds (2.3 kg) for 5 days after your procedure.  Do not drive home if you are discharged the same day of the procedure. Have someone else drive you.  You may drive 24 hours after the procedure unless otherwise instructed by your caregiver.  What to expect: Any bruising will usually fade within 1 to 2 weeks.  Blood that collects in the tissue (hematoma) may be painful to the touch. It should usually decrease in size and tenderness within 1 to 2 weeks.  SEEK IMMEDIATE MEDICAL CARE IF: You have unusual pain at the radial site.  You have redness, warmth, swelling, or pain at the radial site.  You have drainage (other than a small amount of blood on the dressing).  You have chills.  You have a fever or persistent symptoms for more than 72 hours.  You have a fever and your symptoms suddenly get worse.  Your arm becomes pale, cool, tingly, or numb.  You have heavy bleeding from the site. Hold pressure on the site.   PLEASE DO NOT MISS ANY DOSES OF YOUR PLAVIX !!!!! Also keep a log of you blood pressures and bring back to your follow up appt. Please call the office with any questions.   Patients taking blood thinners should  generally stay away from medicines like ibuprofen, Advil, Motrin, naproxen, and Aleve due to risk of stomach bleeding. You may take Tylenol  as directed or talk to your primary doctor about alternatives.  Some studies suggest Prilosec/Omeprazole interacts with Plavix . We changed your Prilosec/Omeprazole to the equivalent dose of Protonix  for less chance of interaction.  PLEASE ENSURE THAT YOU DO NOT RUN OUT OF YOUR PLAVIX . This medication is very important to remain on for at least one year. IF you have issues obtaining this medication due to cost please CALL the office 3-5 business days prior to running out in order to prevent missing doses of this medication.

## 2024-03-06 NOTE — ED Notes (Signed)
 Patient taken to cath lab via cart with RN.

## 2024-03-06 NOTE — H&P (View-Only) (Signed)
  Progress Note  Patient Name: Derrick Todd Date of Encounter: 03/06/2024 [facility] Cardiologist: Ezra Shuck, MD   Interval Summary     No acute events overnight   Pleuritic chest pain improved.  Vital Signs Vitals:   03/06/24 0300 03/06/24 0537 03/06/24 0600 03/06/24 0703  BP: 136/80 (!) 143/82    Pulse: 80 83 78   Resp: 15 18 18    Temp:    98.4 F (36.9 C)  TempSrc:    Oral  SpO2: 96% 100% 95%   Weight:      Height:       No intake or output data in the 24 hours ending 03/06/24 0749    03/05/2024    1:57 PM 12/22/2023    8:20 AM 02/23/2023    8:54 AM  Last 3 Weights  Weight (lbs) 221 lb 221 lb 3.2 oz 210 lb 3.2 oz  Weight (kg) 100.245 kg 100.336 kg 95.346 kg      Telemetry/ECG  Normal sinus rhythm- Personally Reviewed  Physical Exam  GEN: No acute distress.   Neck: No JVD Cardiac: RRR, no murmurs, rubs, or gallops.  Respiratory: Clear to auscultation bilaterally. GI: Soft, nontender, non-distended  MS: No edema  Assessment & Plan  Acute coronary syndrome: History NSTEMI 2013 BMS dRCA Inferior STEMI 2014 BMS distal RCA Unstable angina 2017 DES mid LAD Recurrent acute coronary syndrome this admission with troponins peaking at 1453 Chest pain syndrome with a pleuritic component however ESR and CRP are both normal EKG with dynamic T wave inversions inferiorly Cath today; follow-up echocardiogram Continue heparin  infusion, aspirin  81 mg, rosuvastatin  40 mg, Toprol  75 mg Given chronic low-dose Xarelto  due to peripheral arterial disease; consider Plavix  if PCI is pursued Not a high bleeding risk patient so could tolerate triple therapy Per patient had difficulties with right radial approach during prior cardiac catheterization Peripheral arterial disease Dopplers with 75 to 99% mid left femoral artery stenosis but ABIs normal in 2021 On low-dose Xarelto  T2DM At home on lisinopril  5 mg, Jardiance  25 mg Hyperlipidemia Continue rosuvastatin  40  mg and at home Zetia  10 mg  I have reviewed the risks, indications, and alternatives to cardiac catheterization, possible angioplasty, and stenting with the patient. Risks include but are not limited to bleeding, infection, vascular injury, stroke, myocardial infection, arrhythmia, kidney injury, radiation-related injury in the case of prolonged fluoroscopy use, emergency cardiac surgery, and death. The patient understands the risks of serious complication is 1-2 in 1000 with diagnostic cardiac cath and 1-2% or less with angioplasty/stenting.      For questions or updates, please contact [facility] Please consult [email or web address] for contact info under         Signed, Adaeze Better K Mason Burleigh, MD

## 2024-03-06 NOTE — Progress Notes (Signed)
  Echocardiogram 2D Echocardiogram has been performed.  LAMON MAXWELL 03/06/2024, 11:19 AM

## 2024-03-06 NOTE — Progress Notes (Signed)
  Progress Note  Patient Name: Derrick Todd Date of Encounter: 03/06/2024 [facility] Cardiologist: Ezra Shuck, MD   Interval Summary     No acute events overnight   Pleuritic chest pain improved.  Vital Signs Vitals:   03/06/24 0300 03/06/24 0537 03/06/24 0600 03/06/24 0703  BP: 136/80 (!) 143/82    Pulse: 80 83 78   Resp: 15 18 18    Temp:    98.4 F (36.9 C)  TempSrc:    Oral  SpO2: 96% 100% 95%   Weight:      Height:       No intake or output data in the 24 hours ending 03/06/24 0749    03/05/2024    1:57 PM 12/22/2023    8:20 AM 02/23/2023    8:54 AM  Last 3 Weights  Weight (lbs) 221 lb 221 lb 3.2 oz 210 lb 3.2 oz  Weight (kg) 100.245 kg 100.336 kg 95.346 kg      Telemetry/ECG  Normal sinus rhythm- Personally Reviewed  Physical Exam  GEN: No acute distress.   Neck: No JVD Cardiac: RRR, no murmurs, rubs, or gallops.  Respiratory: Clear to auscultation bilaterally. GI: Soft, nontender, non-distended  MS: No edema  Assessment & Plan  Acute coronary syndrome: History NSTEMI 2013 BMS dRCA Inferior STEMI 2014 BMS distal RCA Unstable angina 2017 DES mid LAD Recurrent acute coronary syndrome this admission with troponins peaking at 1453 Chest pain syndrome with a pleuritic component however ESR and CRP are both normal EKG with dynamic T wave inversions inferiorly Cath today; follow-up echocardiogram Continue heparin  infusion, aspirin  81 mg, rosuvastatin  40 mg, Toprol  75 mg Given chronic low-dose Xarelto  due to peripheral arterial disease; consider Plavix  if PCI is pursued Not a high bleeding risk patient so could tolerate triple therapy Per patient had difficulties with right radial approach during prior cardiac catheterization Peripheral arterial disease Dopplers with 75 to 99% mid left femoral artery stenosis but ABIs normal in 2021 On low-dose Xarelto  T2DM At home on lisinopril  5 mg, Jardiance  25 mg Hyperlipidemia Continue rosuvastatin  40  mg and at home Zetia  10 mg  I have reviewed the risks, indications, and alternatives to cardiac catheterization, possible angioplasty, and stenting with the patient. Risks include but are not limited to bleeding, infection, vascular injury, stroke, myocardial infection, arrhythmia, kidney injury, radiation-related injury in the case of prolonged fluoroscopy use, emergency cardiac surgery, and death. The patient understands the risks of serious complication is 1-2 in 1000 with diagnostic cardiac cath and 1-2% or less with angioplasty/stenting.      For questions or updates, please contact [facility] Please consult [email or web address] for contact info under         Signed, Adaeze Better K Mason Burleigh, MD

## 2024-03-06 NOTE — ED Notes (Signed)
 RN wrapped pt IV with coban.

## 2024-03-06 NOTE — Progress Notes (Signed)
 Patient and patient daughter given discharge instructions, education provided no further questions at this time. Patient able to ambulate and void before discharge. Able to tolerate PO intake. Patient site is clean, dry, intact with no hematoma noted upon discharge. Patient given Plavix , verified when to resume blood thinners with MD, written on paperwork.
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
## 2024-03-06 NOTE — Interval H&P Note (Signed)
 History and Physical Interval Note:  03/06/2024 2:55 PM  Derrick Todd  has presented today for surgery, with the diagnosis of NSTEMI.  The various methods of treatment have been discussed with the patient and family. After consideration of risks, benefits and other options for treatment, the patient has consented to  Procedure(s): LEFT HEART CATH AND CORONARY ANGIOGRAPHY ([name]) as a surgical intervention.  The patient's history has been reviewed, patient examined, no change in status, stable for surgery.  I have reviewed the patient's chart and labs.  Questions were answered to the patient's satisfaction.     Alejos Reinhardt

## 2024-03-07 ENCOUNTER — Other Ambulatory Visit (HOSPITAL_COMMUNITY): Payer: Self-pay

## 2024-03-07 ENCOUNTER — Encounter (HOSPITAL_COMMUNITY): Payer: Self-pay

## 2024-03-07 ENCOUNTER — Telehealth (HOSPITAL_COMMUNITY): Payer: Self-pay

## 2024-03-07 ENCOUNTER — Encounter (HOSPITAL_COMMUNITY): Payer: Self-pay | Admitting: Cardiovascular Disease

## 2024-03-07 MED FILL — Clopidogrel Bisulfate Tab 300 MG (Base Equiv): ORAL | Qty: 1 | Status: AC

## 2024-03-07 NOTE — Telephone Encounter (Signed)
 Office referral received for Cardiac rehab. Attempted to call patient in regards to Cardiac Rehab - LM on VM   Sent letter

## 2024-03-12 NOTE — Progress Notes (Deleted)
 Cardiology Office Note:  .   Date:  03/12/2024  ID:  Derrick Todd, DOB [birth year], MRN [id number] PCP: [name] DASEN, MD  [facility] Providers Cardiologist:  Ezra Shuck, MD { Click to update primary MD,subspecialty MD or APP then REFRESH:1}   History of Present Illness: .   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 Echocardiogram in December 2014 showed EF 50 to 55%. He quit smoking in July 2014. He had unstable angina in December 2017 and underwent DES to 90% mid LAD. He had 80% distal left circumflex lesion that was in the small vessel and that was managed medically. EF was 50 to 55% the time. Myoview  in October 2022 showed EF of 48%, inferior infarct with mild peri-infarct ischemia. She had left retinal detachment and required multiple procedures to treat. He is on low-dose 2.5 mg of Xarelto  at home. Patient was last seen by Dr. Shuck in November 2024 at which time he was doing well. Echocardiogram obtained on 04/12/2023 showed EF 60 to 65%, no regional wall motion abnormality, mild AI.  Patient admiteed March 29, 2024 with NSTEMI cath 03-29-24 with patent LAD and RCA stents with progression of dLcx of 95% felt to be culprit lesion. However given small vessel diameter and small supply of myocardium this was recommended for medical therapy.  -- given plavix  300mg  load, plan to start plavix  75mg  daily tomorrow    ROS: ***  Studies Reviewed: SABRA         Prior CV Studies: {Select studies to display:26339}  Echo: Mar 29, 2024   IMPRESSIONS    1. Left ventricular ejection fraction, by estimation, is 55 to 60%. The  left ventricle has normal function. The left ventricle has no regional  wall motion abnormalities. Left ventricular diastolic parameters were   normal. The average left ventricular  global longitudinal strain is -13.4 %. The global longitudinal strain is  abnormal.   2. Right ventricular systolic function is normal. The right ventricular  size is normal.   3. Left atrial size was mildly dilated.   4. The mitral valve is normal in structure. No evidence of mitral valve  regurgitation. No evidence of mitral stenosis.   5. The aortic valve is normal in structure. There is mild calcification  of the aortic valve. Aortic valve regurgitation is mild. No aortic  stenosis is present.   6. The inferior vena cava is normal in size with greater than 50%  respiratory variability, suggesting right atrial pressure of 3 mmHg.   Comparison(s): No significant change from prior study.   FINDINGS   Left Ventricle: Left ventricular ejection fraction, by estimation, is 55  to 60%. The left ventricle has normal function. The left ventricle has no  regional wall motion abnormalities. The average left ventricular global  longitudinal strain is -13.4 %.  Strain was performed and the global longitudinal strain is abnormal. The  left ventricular internal cavity size was normal in size. There is no left  ventricular hypertrophy. Left ventricular diastolic parameters were  normal.   Right Ventricle: The right ventricular size is normal. No increase in  right ventricular wall thickness. Right ventricular systolic function is  normal.   Left Atrium: Left atrial size was mildly dilated.   Right  Atrium: Right atrial size was normal in size.   Pericardium: There is no evidence of pericardial effusion.   Mitral Valve: The mitral valve is normal in structure. No evidence of  mitral valve regurgitation. No evidence of mitral valve stenosis.   Tricuspid Valve: The tricuspid valve is normal in structure. Tricuspid  valve regurgitation is not demonstrated. No evidence of tricuspid  stenosis.   Aortic Valve: The aortic valve is normal in structure.  There is mild  calcification of the aortic valve. Aortic valve regurgitation is mild.  Aortic regurgitation PHT measures 435 msec. No aortic stenosis is present.   Pulmonic Valve: The pulmonic valve was normal in structure. Pulmonic valve  regurgitation is not visualized. No evidence of pulmonic stenosis.   Aorta: The aortic root is normal in size and structure.   Venous: The inferior vena cava is normal in size with greater than 50%  respiratory variability, suggesting right atrial pressure of 3 mmHg.   IAS/Shunts: No atrial level shunt detected by color flow Doppler.    Cath: 03/06/2024     Dist RCA lesion is 30% stenosed.   Dist Cx lesion is 95% stenosed.   Mid LAD lesion is 40% stenosed.   Non-stenotic Mid RCA lesion was previously treated.   1.  Patent LAD and RCA stents.  There is focal 40% in-stent restenosis in the LAD that does not appear to be significant.  Progression of distal left circumflex disease to 95% with hazy appearance.  This is the likely culprit for non-STEMI.  However, vessel diameter is small and supplied myocardium is very small in size. 2.  Left ventricular angiography was not performed.  EF was normal by echo.  Normal left ventricular end-diastolic pressure.   Recommendations: Risks of PCI on the distal left circumflex outweigh benefits.  Recommend medical therapy. Recommend adding clopidogrel  for 12 months. Xarelto  2.5 mg twice daily can be resumed tomorrow morning.   Diagnostic Dominance: Right  _____________  Risk Assessment/Calculations:   {Does this patient have ATRIAL FIBRILLATION?:[phone]} No BP recorded.  {Refresh Note OR Click here to enter BP  :1}***       Physical Exam:   VS:  There were no vitals taken for this visit.   Orhtostatics: No data found. Wt Readings from Last 3 Encounters:  03/05/24 221 lb (100.2 kg)  12/22/23 221 lb 3.2 oz (100.3 kg)  02/23/23 210 lb 3.2 oz (95.3 kg)    GEN: Well nourished, well developed in no acute  distress NECK: No JVD; No carotid bruits CARDIAC: ***RRR, no murmurs, rubs, gallops RESPIRATORY:  Clear to auscultation without rales, wheezing or rhonchi  ABDOMEN: Soft, non-tender, non-distended EXTREMITIES:  No edema; No deformity   ASSESSMENT AND PLAN: .    CAD s/p BMS x 2 to RCA in 2013 and 2014, bare-metal stent to LAD in 2017 -- hsTn 1453>>1319 -- EKG showed T wave inversion and Q wave in the inferior lead -- underwent cardiac cath 12/3 with patent LAD and RCA stents with progression of dLcx of 95% felt to be culprit lesion. However given small vessel diameter and small supply of myocardium this was recommended for medical therapy.  -- given plavix  300mg  load, plan to start plavix  75mg  daily tomorrow   PAD -- On low-dose 2.5 mg Xarelto  at home, resume 12/4   History of left eye retinal detachment -- Followed by Dr. Valdemar as outpatient.   Hyperlipidemia -- LDL 63, HDL 40 -- Continue rosuvastatin , zetia    DM 2 -- On  Jardiance , glipizide , januvia  PTA  {The patient has an active order for outpatient cardiac rehabilitation.   Please indicate if the patient is ready to start. Do NOT delete this.  It will auto delete.  Refresh note, then sign.              Click here to document readiness and see contraindications.  :1}  Cardiac Rehabilitation Eligibility Assessment      {Are you ordering a CV Procedure (e.g. stress test, cath, DCCV, TEE, etc)?   Press F2        :[id number]}  Dispo: ***  Signed, Olivia Pavy, PA-C

## 2024-03-19 ENCOUNTER — Ambulatory Visit: Admitting: Physician Assistant
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
## 2024-04-10 ENCOUNTER — Encounter (INDEPENDENT_AMBULATORY_CARE_PROVIDER_SITE_OTHER): Payer: Self-pay | Admitting: Ophthalmology

## 2024-04-10 ENCOUNTER — Ambulatory Visit (INDEPENDENT_AMBULATORY_CARE_PROVIDER_SITE_OTHER): Admitting: Ophthalmology

## 2024-04-10 DIAGNOSIS — H33321 Round hole, right eye: Secondary | ICD-10-CM

## 2024-04-10 DIAGNOSIS — H35411 Lattice degeneration of retina, right eye: Secondary | ICD-10-CM

## 2024-04-10 DIAGNOSIS — E119 Type 2 diabetes mellitus without complications: Secondary | ICD-10-CM | POA: Diagnosis not present

## 2024-04-10 DIAGNOSIS — H35033 Hypertensive retinopathy, bilateral: Secondary | ICD-10-CM | POA: Diagnosis not present

## 2024-04-10 DIAGNOSIS — H3322 Serous retinal detachment, left eye: Secondary | ICD-10-CM | POA: Diagnosis not present

## 2024-04-10 DIAGNOSIS — H16232 Neurotrophic keratoconjunctivitis, left eye: Secondary | ICD-10-CM

## 2024-04-10 DIAGNOSIS — I10 Essential (primary) hypertension: Secondary | ICD-10-CM | POA: Diagnosis not present

## 2024-04-10 DIAGNOSIS — H1820 Unspecified corneal edema: Secondary | ICD-10-CM

## 2024-04-10 DIAGNOSIS — H353221 Exudative age-related macular degeneration, left eye, with active choroidal neovascularization: Secondary | ICD-10-CM

## 2024-04-10 DIAGNOSIS — Z961 Presence of intraocular lens: Secondary | ICD-10-CM

## 2024-04-10 DIAGNOSIS — H35052 Retinal neovascularization, unspecified, left eye: Secondary | ICD-10-CM | POA: Diagnosis not present

## 2024-04-10 DIAGNOSIS — H3522 Other non-diabetic proliferative retinopathy, left eye: Secondary | ICD-10-CM | POA: Diagnosis not present

## 2024-04-10 DIAGNOSIS — H25811 Combined forms of age-related cataract, right eye: Secondary | ICD-10-CM

## 2024-04-10 MED ORDER — AFLIBERCEPT 2MG/0.05ML IZ SOLN FOR KALEIDOSCOPE
2.0000 mg | INTRAVITREAL | Status: AC | PRN
  Administered 2024-04-10: 2 mg via INTRAVITREAL

## 2024-04-11 ENCOUNTER — Ambulatory Visit: Admitting: Physician Assistant

## 2024-04-22 ENCOUNTER — Other Ambulatory Visit: Payer: Self-pay | Admitting: Family Medicine

## 2024-04-22 ENCOUNTER — Other Ambulatory Visit (HOSPITAL_COMMUNITY): Payer: Self-pay

## 2024-04-22 ENCOUNTER — Other Ambulatory Visit (HOSPITAL_BASED_OUTPATIENT_CLINIC_OR_DEPARTMENT_OTHER): Payer: Self-pay

## 2024-04-22 ENCOUNTER — Other Ambulatory Visit (HOSPITAL_COMMUNITY): Payer: Self-pay | Admitting: Cardiology

## 2024-04-22 ENCOUNTER — Other Ambulatory Visit: Payer: Self-pay

## 2024-04-22 DIAGNOSIS — E785 Hyperlipidemia, unspecified: Secondary | ICD-10-CM

## 2024-04-22 DIAGNOSIS — I2510 Atherosclerotic heart disease of native coronary artery without angina pectoris: Secondary | ICD-10-CM

## 2024-04-22 MED ORDER — SITAGLIPTIN PHOSPHATE 100 MG PO TABS
100.0000 mg | ORAL_TABLET | Freq: Every day | ORAL | 0 refills | Status: AC
  Filled 2024-04-22: qty 90, 90d supply, fill #0

## 2024-04-22 MED ORDER — METOPROLOL SUCCINATE ER 25 MG PO TB24
75.0000 mg | ORAL_TABLET | Freq: Every day | ORAL | 0 refills | Status: AC
  Filled 2024-04-22: qty 270, 90d supply, fill #0

## 2024-04-22 MED ORDER — MAGNESIUM OXIDE 400 MG PO TABS
400.0000 mg | ORAL_TABLET | Freq: Every day | ORAL | 3 refills | Status: AC
  Filled 2024-04-22: qty 90, 90d supply, fill #0

## 2024-04-22 MED ORDER — GLIPIZIDE ER 10 MG PO TB24
10.0000 mg | ORAL_TABLET | Freq: Every day | ORAL | 0 refills | Status: AC
  Filled 2024-04-22: qty 90, 90d supply, fill #0

## 2024-04-22 MED ORDER — FISH OIL 1000 MG PO CAPS
1000.0000 mg | ORAL_CAPSULE | Freq: Every day | ORAL | 3 refills | Status: AC
  Filled 2024-04-22: qty 90, 90d supply, fill #0

## 2024-04-22 MED ORDER — POTASSIUM CHLORIDE CRYS ER 20 MEQ PO TBCR
20.0000 meq | EXTENDED_RELEASE_TABLET | Freq: Every day | ORAL | 0 refills | Status: AC | PRN
  Filled 2024-04-22: qty 90, 90d supply, fill #0

## 2024-04-22 MED ORDER — EZETIMIBE 10 MG PO TABS
10.0000 mg | ORAL_TABLET | Freq: Every day | ORAL | 0 refills | Status: AC
  Filled 2024-04-22: qty 90, 90d supply, fill #0

## 2024-04-22 MED ORDER — COQ10 100 MG PO CAPS
200.0000 mg | ORAL_CAPSULE | Freq: Every evening | ORAL | 11 refills | Status: AC
  Filled 2024-04-22: qty 60, 30d supply, fill #0

## 2024-04-23 ENCOUNTER — Other Ambulatory Visit: Payer: Self-pay

## 2024-04-23 ENCOUNTER — Other Ambulatory Visit (HOSPITAL_BASED_OUTPATIENT_CLINIC_OR_DEPARTMENT_OTHER): Payer: Self-pay

## 2024-04-23 ENCOUNTER — Other Ambulatory Visit (HOSPITAL_COMMUNITY): Payer: Self-pay

## 2024-04-23 MED ORDER — RIVAROXABAN 2.5 MG PO TABS: 2.5000 mg | ORAL_TABLET | Freq: Two times a day (BID) | ORAL | 3 refills | Status: DC

## 2024-04-24 ENCOUNTER — Encounter (HOSPITAL_COMMUNITY): Payer: Self-pay | Admitting: Cardiology

## 2024-04-24 ENCOUNTER — Other Ambulatory Visit: Payer: Self-pay

## 2024-04-24 ENCOUNTER — Ambulatory Visit (HOSPITAL_COMMUNITY)
Admission: RE | Admit: 2024-04-24 | Discharge: 2024-04-24 | Disposition: A | Discharge status: Home / Self Care | Source: Ambulatory Visit | Attending: Cardiology | Admitting: Cardiology

## 2024-04-24 ENCOUNTER — Other Ambulatory Visit (HOSPITAL_BASED_OUTPATIENT_CLINIC_OR_DEPARTMENT_OTHER): Payer: Self-pay

## 2024-04-24 VITALS — BP 140/80 | HR 75 | Wt 220.2 lb

## 2024-04-24 DIAGNOSIS — Z955 Presence of coronary angioplasty implant and graft: Secondary | ICD-10-CM | POA: Insufficient documentation

## 2024-04-24 DIAGNOSIS — I5032 Chronic diastolic (congestive) heart failure: Secondary | ICD-10-CM | POA: Diagnosis not present

## 2024-04-24 DIAGNOSIS — Z87891 Personal history of nicotine dependence: Secondary | ICD-10-CM | POA: Diagnosis not present

## 2024-04-24 DIAGNOSIS — Z7902 Long term (current) use of antithrombotics/antiplatelets: Secondary | ICD-10-CM | POA: Diagnosis not present

## 2024-04-24 DIAGNOSIS — Z79899 Other long term (current) drug therapy: Secondary | ICD-10-CM | POA: Insufficient documentation

## 2024-04-24 DIAGNOSIS — I2510 Atherosclerotic heart disease of native coronary artery without angina pectoris: Secondary | ICD-10-CM | POA: Insufficient documentation

## 2024-04-24 DIAGNOSIS — Z7984 Long term (current) use of oral hypoglycemic drugs: Secondary | ICD-10-CM | POA: Insufficient documentation

## 2024-04-24 DIAGNOSIS — I252 Old myocardial infarction: Secondary | ICD-10-CM | POA: Diagnosis not present

## 2024-04-24 DIAGNOSIS — E785 Hyperlipidemia, unspecified: Secondary | ICD-10-CM | POA: Diagnosis not present

## 2024-04-24 DIAGNOSIS — Z7901 Long term (current) use of anticoagulants: Secondary | ICD-10-CM | POA: Diagnosis not present

## 2024-04-24 DIAGNOSIS — I739 Peripheral vascular disease, unspecified: Secondary | ICD-10-CM | POA: Diagnosis not present

## 2024-04-24 MED ORDER — RIVAROXABAN 2.5 MG PO TABS
2.5000 mg | ORAL_TABLET | Freq: Two times a day (BID) | ORAL | 11 refills | Status: AC
  Filled 2024-04-24: qty 60, 30d supply, fill #0

## 2024-04-24 MED ORDER — NITROGLYCERIN 0.4 MG SL SUBL
0.4000 mg | SUBLINGUAL_TABLET | SUBLINGUAL | 3 refills | Status: AC | PRN
  Filled 2024-04-24: qty 25, 8d supply, fill #0
  Filled 2024-04-24: qty 25, 1d supply, fill #0
  Filled 2024-05-03: qty 25, 10d supply, fill #0
  Filled 2024-05-03: qty 25, 10d supply, fill #1

## 2024-04-24 NOTE — Patient Instructions (Signed)
 Medication Changes:  Take Nitroglycerin  as needed for chest pain, **DO NOT TAKE if you have recently taken Viagra   Special Instructions // Education:  Do the following things EVERYDAY: Weigh yourself in the morning before breakfast. Write it down and keep it in a log. Take your medicines as prescribed Eat low salt foods--Limit salt (sodium) to 2000 mg per day.  Stay as active as you can everyday Limit all fluids for the day to less than 2 liters   Follow-Up in: 4 months    At the [facility], you and your health needs are our priority. We have a designated team specialized in the treatment of Heart Failure. This Care Team includes your primary Heart Failure Specialized Cardiologist (physician), Advanced Practice Providers (APPs- Physician Assistants and Nurse Practitioners), and Pharmacist who all work together to provide you with the care you need, when you need it.   You may see any of the following providers on your designated Care Team at your next follow up:  Dr. Toribio Fuel Dr. Ezra Shuck Dr. Odis Brownie Greig Mosses, NP Caffie Shed, [place] [facility], [place] Beckey Coe, NP Jordan Lee, NP Tinnie Redman, PharmD   Please be sure to bring in all your medications bottles to every appointment.   Need to Contact Us :  If you have any questions or concerns before your next appointment please send us  a message through [place] or call our office at [phone].    TO LEAVE A MESSAGE FOR THE NURSE SELECT OPTION 2, PLEASE LEAVE A MESSAGE INCLUDING: YOUR NAME DATE OF BIRTH CALL BACK NUMBER REASON FOR CALL**this is important as we prioritize the call backs  YOU WILL RECEIVE A CALL BACK THE SAME DAY AS LONG AS YOU CALL BEFORE 4:00 PM

## 2024-04-25 ENCOUNTER — Other Ambulatory Visit (HOSPITAL_BASED_OUTPATIENT_CLINIC_OR_DEPARTMENT_OTHER): Payer: Self-pay

## 2024-04-25 NOTE — Progress Notes (Signed)
 "     Date:  04/25/2024   ID:  Derrick Todd, DOB 04/05/1960, MRN [id number]   Provider location: [place] Type of Visit: Established patient   PCP:  [name] DASEN, MD  Cardiologist:  Ezra Shuck, MD  Chief complaint: CAD   History of Present Illness: Derrick Todd is a 64 y.o. male who has history of CAD s/p NSTEMI in 5/13 and inferior STEMI in 12/14.  In 5/13, he had an NSTEMI with BMS to totally occluded distal RCA.  In 12/14, he had an inferior STEMI with 95% mid RCA (distal RCA stent patent) at [facility].  He had BMS to [facility].  Echo (12/14) showed EF 50-55%.  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  He quit smoking in 7/14.  Echo (6/15) showed EF 55-60% with basal to mid inferolateral hypokinesis (no significant change from prior).  He was admitted with unstable angina in 12/17, had DES to mid LAD 90% stenosis. Peripheral arterial dopplers in 8/18 showed 75-99% mid left femoral artery stenosis.    Echo in 12/18 with EF 55-60%.   Patient had a spontaneous left retinal detachment which has required multiple procedures to treat.   Cardiolite in 10/22 showed EF 48%, inferior MI with mild peri-infarct ischemia.   Patient was admitted in 12/25 with NSTEMI.  LHC showed patent LAD and RCA stents, there was 95% stenosis in the distal LCx, small area of myocardium served and small diameter vessel so medically managed.  Echo showed EF 55-60% with normal RV.  Plavix  started.    Patient returns for followup of CAD. He has had no chest pain since NSTEMI admission.  SBP 120s generally, high today as he has not yet taken his meds.  He walks 2 miles 2-3 times per week.  No exertional dyspnea.  No claudication. He continues to work as a engineer, civil (consulting) in the ER.   ECG (personally reviewed): NSR, normal  Labs (9/22): LDL 106, TGs 200, K 4.1, creatinine 0.95 Labs (9/24): K 3.8, creatinine 0.9, LDL 62, TGs 110, hgbA1c 6.4 Labs (11/24):  Lp(a) 55 Labs (12/25): LDL 59, K 3.5, creatinine 8.76   PMH: 1. CAD: NSTEMI 5/13 with LHC showing 70% OM, 70% mLAD, total occlusion distal RCA, EF 55%.  Had 2 x 18 BMS to distal RCA.  Echo (5/13): EF 55-60% with basal inferior hypokinesis.  Inferior STEMI (12/14).  LHC showed 60% mLAD, 60-70% dLCx, 95% mRCA, patent dRCA stent.  Patient had BMS to [facility].  Echo (12/14) with EF 50-55% [facility]).  Echo (6/15) with EF 55-60%, basal to mid inferolateral hypokinesis.  - Unstable angina 12/17 with LHC showing 90% mLAD stenosis treated with DES and 80% distal LCx stenosis (small vessel, medical management).  EF 50-55%.  - Echo (12/18): EF 55-60%.  - Cardiolite (10/22): EF 48%, inferior MI with mild peri-infarct ischemia.  - NSTEMI (12/26): Cath showed patent LAD and RCA stents, there was 95% stenosis in the distal LCx, small area of myocardium served and small diameter vessel so medically managed. - Echo (12/25): EF 55-60%, normal RV 2. Hyperlipidemia: myalgias with atorvastatin . Myalgias with Crestor  but more manageable. Feels foggy when he takes Crestor  daily.  3. Type II diabetes 4. Erectile dysfunction 5. Low testosterone  6. PAD: Peripheral arterial dopplers (8/18) with 75-99% mid left femoral artery stenosis.  - ABIs (12/21) normal  Current Outpatient Medications  Medication Sig Dispense Refill   clopidogrel  (PLAVIX ) 75 MG  tablet Take 1 tablet (75 mg total) by mouth daily. 90 tablet 2   Coenzyme Q10 (COQ10) 100 MG CAPS Take 2 capsules (200mg  total) by mouth every evening. 60 capsule 11   empagliflozin  (JARDIANCE ) 25 MG TABS tablet Take 1 tablet (25 mg total) by mouth daily. NEEDS FOLLOW UP APPOINTMENT FOR MORE REFIILLS 30 tablet 1   ezetimibe  (ZETIA ) 10 MG tablet Take 1 tablet (10 mg total) by mouth daily. 90 tablet 0   furosemide  (LASIX ) 20 MG tablet Take 1 tablet (20 mg total) by mouth as needed for fluid or swelling 45 tablet 3   glipiZIDE  (GLUCOTROL  XL) 10 MG 24 hr tablet Take 1 tablet (10  mg total) by mouth daily with breakfast. Do Not Crush. 90 tablet 0   lisinopril  (ZESTRIL ) 5 MG tablet Take 1 tablet (5 mg total) by mouth daily. NEEDS FOLLOW UP APPOINTMENT FOR MORE REFILLS 30 tablet 1   magnesium  oxide (MAG-OX) 400 MG tablet Take 1 tablet (400 mg total) by mouth daily. 90 tablet 3   metFORMIN  (GLUCOPHAGE ) 1000 MG tablet Take 1 tablet (1,000 mg total) by mouth 2 (two) times daily with a meal. 180 tablet 1   metoprolol  succinate (TOPROL -XL) 25 MG 24 hr tablet Take 3 tablets (75 mg total) by mouth daily. 270 tablet 0   nitroGLYCERIN  (NITROSTAT ) 0.4 MG SL tablet Place 1 tablet (0.4 mg total) under the tongue every 5 (five) minutes as needed for chest pain. DO not take if you have taken Viagra . 25 tablet 3   pantoprazole  (PROTONIX ) 40 MG tablet Take 1 tablet (40 mg total) by mouth daily. 30 tablet 1   potassium chloride  SA (KLOR-CON  M) 20 MEQ tablet Take 1 tablet (20 mEq total) by mouth daily as needed. 90 tablet 0   prednisoLONE  acetate (PRED FORTE ) 1 % ophthalmic suspension Place 1 drop into the left eye 3- 4 times daily. 10 mL 5   rosuvastatin  (CRESTOR ) 40 MG tablet Take 1 tablet (40 mg total) by mouth daily. NEEDS FOLLOW UP FOR MORE REFILLS 30 tablet 1   sildenafil  (VIAGRA ) 50 MG tablet TAKE 1 TABLET BY MOUTH DAILY AS NEEDED FOR ERECTILE DYSFUNCTION. DO NOT TAKE NITROGLYCERIN  WITHIN 24 HOURS OF TAKING VIAGRA  15 tablet 1   sitaGLIPtin  (JANUVIA ) 100 MG tablet Take 1 tablet (100 mg total) by mouth daily. 90 tablet 0   sodium chloride  (MURO 128) 5 % ophthalmic solution Place 1 drop into the left eye in the morning, at noon, and at bedtime.     Omega-3 Fatty Acids  (FISH OIL ) 1000 MG CAPS Take 1 capsule (1,000 mg total) by mouth daily. (Patient not taking: Reported on 04/24/2024) 90 capsule 3   rivaroxaban  (XARELTO ) 2.5 MG TABS tablet Take 1 tablet (2.5 mg total) by mouth 2 (two) times daily. 60 tablet 11   No current facility-administered medications for this encounter.    Allergies:    Patient has no known allergies.   Social History:  The patient  reports that he quit smoking about 12 years ago. His smoking use included cigarettes. He started smoking about 27 years ago. He has a 7.5 pack-year smoking history. He has never used smokeless tobacco. He reports current alcohol use of about 6.0 - 7.0 standard drinks of alcohol per week. He reports that he does not use drugs.   Family History:  The patient's family history includes Arthritis in his mother; Depression in his mother; Diabetes in his brother and mother; Heart attack in his maternal grandfather; Heart failure  in his maternal grandfather; Hyperlipidemia in his mother; Hypertension in his brother, father, maternal grandfather, and mother; Learning disabilities in his mother; Mental illness in his mother; Stroke in his father and mother.   ROS:  Please see the history of present illness.   All other systems are personally reviewed and negative.   Exam:   BP (!) 140/80   Pulse 75   Wt 99.9 kg (220 lb 3.2 oz)   SpO2 97%   BMI 28.27 kg/m  General: NAD Neck: No JVD, no thyromegaly or thyroid nodule.  Lungs: Clear to auscultation bilaterally with normal respiratory effort. CV: Nondisplaced PMI.  Heart regular S1/S2, no S3/S4, no murmur.  No peripheral edema.  No carotid bruit.  Difficult to palpate pedal pulses.  Abdomen: Soft, nontender, no hepatosplenomegaly, no distention.  Skin: Intact without lesions or rashes.  Neurologic: Alert and oriented x 3.  Psych: Normal affect. Extremities: No clubbing or cyanosis.  HEENT: Normal.   Recent Labs: 03/05/2024: ALT 31; Hemoglobin 13.6; Platelets 275 03/06/2024: BUN 10; Creatinine, Ser 1.23; Potassium 3.8; Sodium 138  Personally reviewed   Wt Readings from Last 3 Encounters:  04/24/24 99.9 kg (220 lb 3.2 oz)  03/05/24 100.2 kg (221 lb)  12/22/23 100.3 kg (221 lb 3.2 oz)      ASSESSMENT AND PLAN:  1. CAD: Unstable angina in 12/17 with DES to mid LAD, had medically  managed 80% distal LCx stenosis (small vessel).   Cardiolite in 10/22 with prior inferior MI, EF 48%. NSTEMI in 1/26, LHC showed patent LAD and RCA stents, there was progressive disease in the distal LCx with 95% stenosis, small area of myocardium served and small diameter vessel so medically managed.  No further chest pain.  - Continue Plavix  75 mg daily x 1 year then back to ASA 81.  - Continue Xarelto  2.5 mg bid (COMPASS regimen) given extensive vascular disease.  - Continue Crestor  40 mg daily + Zetia  10 mg daily.    - Continue current Toprol  XL and lisinopril .  - Prescription for prn NTG.  - Working full time as a engineer, civil (consulting) so does not want cardiac rehab.  2. Hyperlipidemia:  Tolerating Crestor  40 mg daily and Zetia  10 mg daily.  Most recent LDL was 59.  I will check lipoprotein(a) was not particularly elevated in 11/24 (55).  3. PAD: 75-99% mid left femoral artery stenosis on peripheral arterial dopplers from 8/18, but ABIs normal in 12/21.  No claudication and no pedal ulcerations. Medical management for now.   Recommended follow-up:  4 months   I spent 21 minutes reviewing records, interviewing/examining patient, and managing orders.   Signed, Ezra Shuck, MD  04/25/2024  [facility] [address] [facility] [place] [address] [phone] (office) [phone] (fax) "

## 2024-05-03 ENCOUNTER — Other Ambulatory Visit (HOSPITAL_COMMUNITY): Payer: Self-pay

## 2024-05-03 ENCOUNTER — Other Ambulatory Visit (HOSPITAL_BASED_OUTPATIENT_CLINIC_OR_DEPARTMENT_OTHER): Payer: Self-pay
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
## 2024-05-22 ENCOUNTER — Encounter (INDEPENDENT_AMBULATORY_CARE_PROVIDER_SITE_OTHER): Admitting: Ophthalmology

## 2024-05-22 DIAGNOSIS — I10 Essential (primary) hypertension: Secondary | ICD-10-CM

## 2024-05-22 DIAGNOSIS — E119 Type 2 diabetes mellitus without complications: Secondary | ICD-10-CM

## 2024-05-22 DIAGNOSIS — H35411 Lattice degeneration of retina, right eye: Secondary | ICD-10-CM

## 2024-05-22 DIAGNOSIS — H16232 Neurotrophic keratoconjunctivitis, left eye: Secondary | ICD-10-CM

## 2024-05-22 DIAGNOSIS — H3322 Serous retinal detachment, left eye: Secondary | ICD-10-CM

## 2024-05-22 DIAGNOSIS — H35052 Retinal neovascularization, unspecified, left eye: Secondary | ICD-10-CM

## 2024-05-22 DIAGNOSIS — H35033 Hypertensive retinopathy, bilateral: Secondary | ICD-10-CM

## 2024-05-22 DIAGNOSIS — H353221 Exudative age-related macular degeneration, left eye, with active choroidal neovascularization: Secondary | ICD-10-CM

## 2024-05-22 DIAGNOSIS — H25811 Combined forms of age-related cataract, right eye: Secondary | ICD-10-CM

## 2024-05-22 DIAGNOSIS — Z961 Presence of intraocular lens: Secondary | ICD-10-CM

## 2024-05-22 DIAGNOSIS — H1820 Unspecified corneal edema: Secondary | ICD-10-CM

## 2024-05-22 DIAGNOSIS — H33321 Round hole, right eye: Secondary | ICD-10-CM

## 2024-05-22 DIAGNOSIS — H3522 Other non-diabetic proliferative retinopathy, left eye: Secondary | ICD-10-CM

## 2024-06-20 ENCOUNTER — Ambulatory Visit: Admitting: Family Medicine

## 2024-08-21 ENCOUNTER — Ambulatory Visit (HOSPITAL_COMMUNITY): Admitting: Cardiology

## 2024-12-24 ENCOUNTER — Encounter: Admitting: Family Medicine
